# Patient Record
Sex: Male | Born: 1945
Health system: Southern US, Community
[De-identification: ages and names within clinical notes are randomized; demographics above are authoritative.]

## PROBLEM LIST (undated history)

## (undated) DIAGNOSIS — E785 Hyperlipidemia, unspecified: Secondary | ICD-10-CM

## (undated) HISTORY — PX: NO PAST SURGERIES: SHX2092

## (undated) HISTORY — DX: Hyperlipidemia, unspecified: E78.5

---

## 2019-02-22 ENCOUNTER — Other Ambulatory Visit: Payer: Self-pay

## 2019-02-22 ENCOUNTER — Encounter (HOSPITAL_COMMUNITY): Payer: Self-pay

## 2019-02-22 ENCOUNTER — Emergency Department (HOSPITAL_COMMUNITY)
Admission: EM | Admit: 2019-02-22 | Discharge: 2019-02-23 | Disposition: A | Discharge status: Home / Self Care | Payer: Medicare Other | Attending: Emergency Medicine | Admitting: Emergency Medicine

## 2019-02-22 DIAGNOSIS — Z5321 Procedure and treatment not carried out due to patient leaving prior to being seen by health care provider: Secondary | ICD-10-CM | POA: Diagnosis not present

## 2019-02-22 DIAGNOSIS — S8992XA Unspecified injury of left lower leg, initial encounter: Secondary | ICD-10-CM | POA: Insufficient documentation

## 2019-02-22 DIAGNOSIS — Y999 Unspecified external cause status: Secondary | ICD-10-CM | POA: Insufficient documentation

## 2019-02-22 DIAGNOSIS — Y929 Unspecified place or not applicable: Secondary | ICD-10-CM | POA: Diagnosis not present

## 2019-02-22 DIAGNOSIS — W25XXXA Contact with sharp glass, initial encounter: Secondary | ICD-10-CM | POA: Diagnosis not present

## 2019-02-22 DIAGNOSIS — Y939 Activity, unspecified: Secondary | ICD-10-CM | POA: Insufficient documentation

## 2019-02-22 NOTE — ED Triage Notes (Signed)
Pt states that a glass bottle but his L lower leg, bleeding controlled

## 2019-02-23 ENCOUNTER — Encounter (HOSPITAL_COMMUNITY): Payer: Self-pay

## 2019-02-23 ENCOUNTER — Other Ambulatory Visit: Payer: Self-pay

## 2019-02-23 ENCOUNTER — Ambulatory Visit (HOSPITAL_COMMUNITY)
Admission: EM | Admit: 2019-02-23 | Discharge: 2019-02-23 | Disposition: A | Discharge status: Home / Self Care | Payer: Medicare Other | Attending: Family Medicine | Admitting: Family Medicine

## 2019-02-23 DIAGNOSIS — S91312A Laceration without foreign body, left foot, initial encounter: Secondary | ICD-10-CM

## 2019-02-23 DIAGNOSIS — W25XXXA Contact with sharp glass, initial encounter: Secondary | ICD-10-CM | POA: Diagnosis not present

## 2019-02-23 DIAGNOSIS — R030 Elevated blood-pressure reading, without diagnosis of hypertension: Secondary | ICD-10-CM

## 2019-02-23 DIAGNOSIS — Z23 Encounter for immunization: Secondary | ICD-10-CM | POA: Diagnosis not present

## 2019-02-23 MED ORDER — TETANUS-DIPHTH-ACELL PERTUSSIS 5-2.5-18.5 LF-MCG/0.5 IM SUSP
0.5000 mL | Freq: Once | INTRAMUSCULAR | Status: AC
  Administered 2019-02-23: 0.5 mL via INTRAMUSCULAR

## 2019-02-23 MED ORDER — TETANUS-DIPHTH-ACELL PERTUSSIS 5-2.5-18.5 LF-MCG/0.5 IM SUSP
INTRAMUSCULAR | Status: AC
  Filled 2019-02-23: qty 0.5

## 2019-02-23 NOTE — ED Notes (Signed)
Pt did not want to wait and left. 

## 2019-02-23 NOTE — Discharge Instructions (Addendum)
Keep the wound dry for the next 24 hours and then place a bandage only when you are going to be outside or in a dirty area.  Return in 1 week for removal of sutures.

## 2019-02-23 NOTE — ED Provider Notes (Addendum)
[facility]    CSN: [id number] Arrival date & time: 02/23/19  0847      History   Chief Complaint Chief Complaint  Patient presents with  . Laceration    HPI Elliotte Vance Wiant is a 73 y.o. male.   73 year old gentleman dropped some glass on his left foot yesterday with a laceration over the dorsal aspect.  He has had continued bleeding since.  Unsure of last tetanus shot.  Patient has no history of elevated blood pressure.  He is somewhat anxious about the continued bleeding overnight.     History reviewed. No pertinent past medical history.  There are no problems to display for this patient.   History reviewed. No pertinent surgical history.     Home Medications    Prior to Admission medications   Not on File    Family History History reviewed. No pertinent family history.  Social History Social History   Tobacco Use  . Smoking status: Never Smoker  . Smokeless tobacco: Never Used  Substance Use Topics  . Alcohol use: Yes  . Drug use: Never     Allergies   Patient has no known allergies.   Review of Systems Review of Systems   Physical Exam Triage Vital Signs ED Triage Vitals  Enc Vitals Group     BP 02/23/19 0903 (!) 191/94     Pulse Rate 02/23/19 0903 64     Resp 02/23/19 0903 16     Temp 02/23/19 0903 97.9 F (36.6 C)     Temp Source 02/23/19 0903 Oral     SpO2 02/23/19 0903 98 %     Weight --      Height --      Head Circumference --      Peak Flow --      Pain Score 02/23/19 0901 0     Pain Loc --      Pain Edu? --      Excl. in [place]? --    No data found.  Updated Vital Signs BP (!) 191/94 (BP Location: Right Arm)   Pulse 64   Temp 97.9 F (36.6 C) (Oral)   Resp 16   SpO2 98%    Physical Exam Vitals and nursing note reviewed.  Constitutional:      Appearance: Normal appearance.  Pulmonary:     Effort: Pulmonary effort is normal.  Musculoskeletal:        General: Normal range of motion.   Cervical back: Normal range of motion and neck supple.  Skin:    General: Skin is warm and dry.     Findings: Lesion present.  Neurological:     General: No focal deficit present.     Mental Status: He is alert.  Psychiatric:        Mood and Affect: Mood normal.      UC Treatments / Results  Labs (all labs ordered are listed, but only abnormal results are displayed) Labs Reviewed - No data to display  EKG   Radiology No results found.  Procedures Laceration Repair  Date/Time: 02/23/2019 10:01 AM Performed by: Robyn Haber, MD Authorized by: Robyn Haber, MD   Consent:    Consent obtained:  Verbal   Consent given by:  Patient   Risks discussed:  Pain   Alternatives discussed:  No treatment Anesthesia (see MAR for exact dosages):    Anesthesia method:  Local infiltration   Local anesthetic:  Lidocaine 2% WITH epi Laceration details:  Location:  Foot   Foot location:  Top of L foot   Length (cm):  2.7   Depth (mm):  5 Repair type:    Repair type:  Simple Pre-procedure details:    Preparation:  Patient was prepped and draped in usual sterile fashion Exploration:    Contaminated: no   Treatment:    Area cleansed with:  Betadine and soap and water   Amount of cleaning:  Standard   Irrigation solution:  Sterile saline   Irrigation volume:  Soap and water   Visualized foreign bodies/material removed: no   Skin repair:    Repair method:  Sutures   Suture size:  4-0   Suture technique:  Horizontal mattress   Number of sutures:  4 Approximation:    Approximation:  Close Post-procedure details:    Dressing:  Bulky dressing   Patient tolerance of procedure:  Tolerated well, no immediate complications   (including critical care time)  Medications Ordered in UC Medications  Tdap (BOOSTRIX) injection 0.5 mL (has no administration in time range)    Initial Impression / Assessment and Plan / UC Course  I have reviewed the triage vital signs and the  nursing notes.  Pertinent labs & imaging results that were available during my care of the patient were reviewed by me and considered in my medical decision making (see chart for details).    Final Clinical Impressions(s) / UC Diagnoses   Final diagnoses:  Elevated BP without diagnosis of hypertension  Laceration of left foot, initial encounter     Discharge Instructions     Keep the wound dry for the next 24 hours and then place a bandage only when you are going to be outside or in a dirty area.  Return in 1 week for removal of sutures.    ED Prescriptions    None     I have reviewed the PDMP during this encounter.   Robyn Haber, MD 02/23/19 1002    Robyn Haber, MD 02/23/19 1003

## 2019-02-23 NOTE — ED Notes (Signed)
Vital signs reported to Provider K. Harris.

## 2019-02-23 NOTE — ED Triage Notes (Signed)
Pt states yesterday he cut his left ankle with a glass bottle.

## 2019-03-01 ENCOUNTER — Ambulatory Visit (HOSPITAL_COMMUNITY)
Admission: EM | Admit: 2019-03-01 | Discharge: 2019-03-01 | Disposition: A | Discharge status: Home / Self Care | Payer: Medicare Other

## 2019-03-01 ENCOUNTER — Other Ambulatory Visit: Payer: Self-pay

## 2019-03-01 DIAGNOSIS — S91312D Laceration without foreign body, left foot, subsequent encounter: Secondary | ICD-10-CM

## 2019-03-01 DIAGNOSIS — Z4802 Encounter for removal of sutures: Secondary | ICD-10-CM

## 2019-03-01 NOTE — ED Triage Notes (Signed)
Pt presents to have 4 sutures removed from left ankle.

## 2019-10-05 DIAGNOSIS — Z20822 Contact with and (suspected) exposure to covid-19: Secondary | ICD-10-CM | POA: Diagnosis not present

## 2019-12-04 ENCOUNTER — Ambulatory Visit: Payer: Medicare Other | Admitting: Family Medicine

## 2019-12-20 ENCOUNTER — Ambulatory Visit (INDEPENDENT_AMBULATORY_CARE_PROVIDER_SITE_OTHER): Payer: Medicare HMO | Admitting: Emergency Medicine

## 2019-12-20 ENCOUNTER — Encounter: Payer: Self-pay | Admitting: Emergency Medicine

## 2019-12-20 ENCOUNTER — Other Ambulatory Visit: Payer: Self-pay

## 2019-12-20 VITALS — BP 159/77 | HR 47 | Temp 98.4°F | Resp 16 | Ht 66.0 in | Wt 147.0 lb

## 2019-12-20 DIAGNOSIS — R4189 Other symptoms and signs involving cognitive functions and awareness: Secondary | ICD-10-CM | POA: Diagnosis not present

## 2019-12-20 DIAGNOSIS — R413 Other amnesia: Secondary | ICD-10-CM | POA: Diagnosis not present

## 2019-12-20 DIAGNOSIS — Z23 Encounter for immunization: Secondary | ICD-10-CM

## 2019-12-20 DIAGNOSIS — Z1211 Encounter for screening for malignant neoplasm of colon: Secondary | ICD-10-CM

## 2019-12-20 DIAGNOSIS — R6889 Other general symptoms and signs: Secondary | ICD-10-CM

## 2019-12-20 DIAGNOSIS — Z7689 Persons encountering health services in other specified circumstances: Secondary | ICD-10-CM

## 2019-12-20 LAB — LIPID PANEL

## 2019-12-20 NOTE — Patient Instructions (Addendum)
   If you have lab work done today you will be contacted with your lab results within the next 2 weeks.  If you have not heard from us then please contact us. The fastest way to get your results is to register for My Chart.   IF you received an x-ray today, you will receive an invoice from [facility]. Please contact [facility] at [phone] with questions or concerns regarding your invoice.   IF you received labwork today, you will receive an invoice from [facility]. Please contact [facility] at [phone] with questions or concerns regarding your invoice.   Our billing staff will not be able to assist you with questions regarding bills from these companies.  You will be contacted with the lab results as soon as they are available. The fastest way to get your results is to activate your My Chart account. Instructions are located on the last page of this paperwork. If you have not heard from us regarding the results in 2 weeks, please contact this office.     Mantenimiento de la salud despus de los 65 aos de edad Health Maintenance After Age 65 Despus de los 65 aos de edad, corre un riesgo mayor de padecer ciertas enfermedades e infecciones a largo plazo, como tambin de sufrir lesiones por cadas. Las cadas son la causa principal de las fracturas de huesos y lesiones en la cabeza de personas mayores de 65 aos de edad. Recibir cuidados preventivos de forma regular puede ayudarlo a mantenerse saludable y en buen estado. Los cuidados preventivos incluyen realizarse anlisis de forma regular y realizar cambios en el estilo de vida segn las recomendaciones del mdico. Converse con el profesional que lo asiste sobre:  Las pruebas de deteccin y los anlisis que debe realizarse. Una prueba de deteccin es un estudio que se para detectar la presencia de una enfermedad cuando no tiene sntomas.  Un plan de dieta y ejercicios adecuado para usted. Qu debo saber sobre las  pruebas de deteccin y los anlisis para prevenir cadas? Realizarse pruebas de deteccin y anlisis es la mejor manera de detectar un problema de salud de forma temprana. El diagnstico y tratamiento tempranos le brindan la mejor oportunidad de controlar las afecciones mdicas que son comunes despus de los 65 aos de edad. Ciertas afecciones y elecciones de estilo de vida pueden hacer que sea ms propenso a sufrir una cada. El mdico puede recomendarle lo siguiente:  Controles regulares de la visin. Una visin deficiente y afecciones como las cataratas pueden hacer que sea ms propenso a sufrir una cada. Si usa lentes, asegrese de obtener una receta actualizada si su visin cambia.  Revisin de medicamentos. Revise regularmente con el mdico todos los medicamentos que toma, incluidos los medicamentos de venta libre. Consulte al mdico sobre los efectos secundarios que pueden hacer que sea ms propenso a sufrir una cada. Informe al mdico si alguno de los medicamentos que toma lo hace sentir mareado o somnoliento.  Pruebas de deteccin para la osteoporosis. La osteoporosis es una afeccin que hace que los huesos se vuelvan ms frgiles. En consecuencia, los huesos pueden debilitarse y quebrarse ms fcilmente.  Pruebas de deteccin para la presin arterial. Los cambios en la presin arterial y los medicamentos para controlar la presin arterial pueden hacerlo sentir mareado.  Controles de fuerza y equilibrio. El mdico puede recomendar ciertos estudios para controlar su fuerza y equilibrio al estar de pie, al caminar o al cambiar de posicin.  Examen de los pies.   El dolor y Chiropractor en los pies, como tambin no utilizar el calzado [place], pueden hacer que sea ms propenso a sufrir Engineer, manufacturing.  Prueba de deteccin de la depresin. Es ms probable que sufra una cada si tiene miedo a caerse, se siente mal emocionalmente o se siente incapaz de Stage manager.  Prueba de deteccin de consumo de alcohol. Beber demasiado alcohol puede afectar su equilibrio y puede hacer que sea ms propenso a sufrir Engineer, manufacturing. Qu medidas puedo tomar para reducir mi riesgo de sufrir una cada? Instrucciones generales  Hable con el mdico sobre sus riesgos de sufrir una cada. Infrmele a su mdico si: ? Se cae. Asegrese de informarle a su mdico acerca de todas las cadas, incluso aquellas que parecen ser [employer] ? Se siente mareado, somnoliento o que pierde el equilibrio.  Tome los medicamentos de venta libre y los recetados solamente como se lo haya indicado el mdico. Estos incluyen todos los suplementos.  Siga una dieta sana y [place] un peso saludable. Una dieta saludable incluye productos lcteos descremados, carnes bajas en contenido de grasa ([place], [place] de granos enteros, frijoles y [place] frutas y verduras. La seguridad en el hogar  Retire los objetos que puedan causar tropiezos tales como alfombras, cables u obstculos.  Instale equipos de seguridad, como barras para sostn en los baos y barandas de seguridad en las escaleras.  [place] habitaciones y los pasillos bien iluminados. Actividad   Siga un programa de ejercicio regular para mantenerse en forma. Esto lo ayudar a Contractor equilibrio. Consulte al mdico qu tipos de ejercicios son adecuados para usted.  Si necesita un bastn o un andador, selo segn las recomendaciones del mdico.  Utilice calzado con buen apoyo y suela antideslizante. Estilo de vida  No beba alcohol si el mdico le indica que no beba.  Si bebe alcohol, limite la cantidad que consume: ? De 0 a 1 medida por da para las mujeres. ? De 0 a 2 medidas por da para los hombres.  Est atento a la cantidad de alcohol que contiene su bebida. En los [place] una medida equivale a una botella tpica de cerveza (12 onzas), media copa de vino (5 onzas) o una medida de bebida blanca (1 onza).  No consuma  ningn producto que contenga nicotina o tabaco, como cigarrillos y Psychologist, sport and exercise. Si necesita ayuda para dejar de fumar, consulte al mdico. Resumen  Tener un estilo de vida saludable y recibir cuidados preventivos pueden ayudar a Theatre stage manager salud y el bienestar despus de los 39 aos de [place].  Realizarse pruebas de deteccin y [employer] es la mejor manera de Hydrographic surveyor un problema de salud de forma temprana y Lourena Simmonds a Product/process development scientist una cada. El diagnstico y tratamiento tempranos le brindan la mejor oportunidad de Chief Technology Officer las afecciones mdicas ms comunes en las personas mayores de 86 aos de edad.  Las cadas son la causa principal de las fracturas de huesos y lesiones en la cabeza de personas mayores de 36 aos de edad. Tome precauciones para evitar una cada en su casa.  Trabaje con el mdico para saber qu cambios que puede hacer para mejorar su salud y [place], y [place]. Esta informacin no tiene Marine scientist el consejo del mdico. Asegrese de hacerle al mdico cualquier pregunta que tenga. Document Revised: 04/01/2017 Document Reviewed: 04/01/2017 Elsevier Patient Education  2020 [employer].

## 2019-12-20 NOTE — Progress Notes (Signed)
Samual Beals Haisley 74 y.o.   Chief Complaint  Patient presents with  . Establish Care    HISTORY OF PRESENT ILLNESS: This is a 74 y.o. male first visit to this office, here to establish care with me. Accompanied by wife who states patient has been forgetful, having memory problems, angry at times for the past several months. Patient has no history of chronic medical problems.  On no chronic medications. Healthy lifestyle.  Non-smoker.  HPI   Prior to Admission medications   Medication Sig Start Date End Date Taking? Authorizing Provider  Ascorbic Acid (VITAMIN C) 100 MG tablet Take 500 mg by mouth daily.   Yes [provider]  Calcium Carb-Cholecalciferol (CALCIUM 600 + D PO) Take by mouth.   Yes [provider]  Multiple Vitamin (MULTIVITAMIN) tablet Take 1 tablet by mouth daily.   Yes [provider]  OVER THE COUNTER MEDICATION    Yes [provider]  [place]    Yes [provider]    No Known Allergies  There are no problems to display for this patient.   History reviewed. No pertinent past medical history.  History reviewed. No pertinent surgical history.  Social History   Socioeconomic History  . Marital status: Married    Spouse name: Not on file  . Number of children: Not on file  . Years of education: Not on file  . Highest education level: Not on file  Occupational History  . Not on file  Tobacco Use  . Smoking status: Never Smoker  . Smokeless tobacco: Never Used  Substance and Sexual Activity  . Alcohol use: Yes  . Drug use: Never  . Sexual activity: Not on file  Other Topics Concern  . Not on file  Social History Narrative  . Not on file   Social Determinants of Health   Financial Resource Strain:   . Difficulty of Paying Living Expenses: Not on file  Food Insecurity:   . Worried About Charity fundraiser in the Last Year: Not on file  . Ran Out of Food in the Last Year: Not  on file  Transportation Needs:   . Lack of Transportation (Medical): Not on file  . Lack of Transportation (Non-Medical): Not on file  Physical Activity:   . Days of Exercise per Week: Not on file  . Minutes of Exercise per Session: Not on file  Stress:   . Feeling of Stress : Not on file  Social Connections:   . Frequency of Communication with Friends and Family: Not on file  . Frequency of Social Gatherings with Friends and Family: Not on file  . Attends Religious Services: Not on file  . Active Member of Clubs or Organizations: Not on file  . Attends Archivist Meetings: Not on file  . Marital Status: Not on file  Intimate Partner Violence:   . Fear of Current or Ex-Partner: Not on file  . Emotionally Abused: Not on file  . Physically Abused: Not on file  . Sexually Abused: Not on file    History reviewed. No pertinent family history.   Review of Systems  Constitutional: Negative.  Negative for chills and fever.  HENT: Negative.  Negative for congestion and sore throat.   Eyes: Negative.   Respiratory: Negative.  Negative for cough and shortness of breath.   Cardiovascular: Negative.  Negative for chest pain and palpitations.  Gastrointestinal: Negative.  Negative for abdominal pain, blood in stool, diarrhea,  melena, nausea and vomiting.  Genitourinary: Negative.  Negative for dysuria and hematuria.  Musculoskeletal: Negative.  Negative for back pain, myalgias and neck pain.  Skin: Negative.  Negative for rash.  Neurological: Negative.  Negative for dizziness and headaches.  All other systems reviewed and are negative.  Today's Vitals   12/20/19 1106  BP: (!) 159/77  Pulse: (!) 47  Resp: 16  Temp: 98.4 F (36.9 C)  TempSrc: Temporal  SpO2: 97%  Weight: 147 lb (66.7 kg)  Height: 5\' 6"  (1.676 m)   Body mass index is 23.73 kg/m.   Physical Exam Vitals reviewed.  Constitutional:      Appearance: Normal appearance.  HENT:     Head: Normocephalic.      Mouth/Throat:     Mouth: Mucous membranes are moist.     Pharynx: Oropharynx is clear.  Eyes:     Extraocular Movements: Extraocular movements intact.     Conjunctiva/sclera: Conjunctivae normal.     Pupils: Pupils are equal, round, and reactive to light.  Neck:     Vascular: No carotid bruit.  Cardiovascular:     Rate and Rhythm: Normal rate and regular rhythm.     Heart sounds: Murmur heard.  Systolic murmur is present with a grade of 2/6.      Comments: Aortic area systolic murmur Pulmonary:     Effort: Pulmonary effort is normal.     Breath sounds: Normal breath sounds.  Abdominal:     General: Bowel sounds are normal. There is no distension.     Palpations: Abdomen is soft. There is no mass.     Tenderness: There is no abdominal tenderness.  Musculoskeletal:        General: Normal range of motion.     Cervical back: Normal range of motion and neck supple.     Right lower leg: No edema.     Left lower leg: No edema.  Lymphadenopathy:     Cervical: No cervical adenopathy.  Skin:    General: Skin is warm and dry.     Capillary Refill: Capillary refill takes less than 2 seconds.  Neurological:     General: No focal deficit present.     Mental Status: He is alert and oriented to person, place, and time.  Psychiatric:        Mood and Affect: Mood normal.        Behavior: Behavior normal.      ASSESSMENT & PLAN: Duayne was seen today for establish care.  Diagnoses and all orders for this visit:  Encounter to establish care  Memory deficit -     Ambulatory referral to Neurology  Forgetfulness -     CBC with Differential/Platelet -     Comprehensive metabolic panel -     Vitamin B12 -     VITAMIN D 25 Hydroxy (Vit-D Deficiency, Fractures) -     Hemoglobin A1c -     TSH -     Lipid panel -     Ambulatory referral to Neurology  Need for prophylactic vaccination and inoculation against influenza -     Flu Vaccine QUAD High Dose(Fluad)  Cognitive decline -      CBC with Differential/Platelet -     Comprehensive metabolic panel -     Vitamin B12 -     VITAMIN D 25 Hydroxy (Vit-D Deficiency, Fractures) -     Hemoglobin A1c -     TSH -     Lipid panel -  Ambulatory referral to Neurology  Screening for colon cancer -     Ambulatory referral to Gastroenterology    Patient Instructions       If you have lab work done today you will be contacted with your lab results within the next 2 weeks.  If you have not heard from [place] then please contact us. The fastest way to get your results is to register for My Chart.   IF you received an x-ray today, you will receive an invoice from [facility]. Please contact [facility] at [phone] with questions or concerns regarding your invoice.   IF you received labwork today, you will receive an invoice from [facility]. Please contact [facility] at [phone] with questions or concerns regarding your invoice.   Our billing staff will not be able to assist you with questions regarding bills from these companies.  You will be contacted with the lab results as soon as they are available. The fastest way to get your results is to activate your My Chart account. Instructions are located on the last page of this paperwork. If you have not heard from [place] regarding the results in 2 weeks, please contact this office.     Mantenimiento de la salud despus de los 42 aos de edad Health Maintenance After Age 72 Despus de los 65 aos de edad, corre un riesgo mayor de Tourist information centre manager enfermedades e infecciones a Barrister's clerk, como tambin de sufrir lesiones por cadas. Las cadas son la causa principal de las fracturas de huesos y lesiones en la cabeza de personas mayores de 13 aos de edad. Recibir cuidados preventivos de forma regular puede ayudarlo a mantenerse saludable y en buen [place]. Los cuidados preventivos incluyen realizarse anlisis de forma regular y Actor en el estilo de vida  segn las recomendaciones del mdico. Converse con el profesional que lo asiste sobre:  Las pruebas de deteccin y los anlisis que debe Dispensing optician. Una prueba de deteccin es un estudio que se para Hydrographic surveyor la presencia de una enfermedad cuando no tiene sntomas.  Un plan de dieta y ejercicios adecuado para usted. Qu debo saber sobre las pruebas de deteccin y los anlisis para prevenir cadas? Realizarse pruebas de deteccin y [employer] es la mejor manera de Hydrographic surveyor un problema de salud de forma temprana. El diagnstico y tratamiento tempranos le brindan la mejor oportunidad de Chief Technology Officer las afecciones mdicas que son comunes despus de los 71 aos de edad. Ciertas afecciones y elecciones de estilo de vida pueden hacer que sea ms propenso a sufrir Engineer, manufacturing. El mdico puede recomendarle lo siguiente:  Controles regulares de la visin. Una visin deficiente y afecciones como las cataratas pueden hacer que sea ms propenso a sufrir Engineer, manufacturing. Si [place] lentes, asegrese de obtener una receta actualizada si su visin cambia.  Revisin de medicamentos. Revise regularmente con el mdico todos los medicamentos que toma, incluidos los medicamentos de [place]. Consulte al [employer] efectos secundarios que pueden hacer que sea ms propenso a sufrir Engineer, manufacturing. Informe al mdico si alguno de los medicamentos que toma lo hace sentir mareado o somnoliento.  Pruebas de deteccin para la osteoporosis. La osteoporosis es una afeccin que hace que los huesos se vuelvan ms frgiles. En consecuencia, los huesos pueden debilitarse y quebrarse ms fcilmente.  Pruebas de deteccin para la presin arterial. Los cambios en la presin arterial y los medicamentos para Chief Technology Officer la presin arterial pueden hacerlo sentir mareado.  Controles de fuerza y equilibrio. El mdico  puede recomendar ciertos estudios para controlar su fuerza y equilibrio al estar de pie, al caminar o al cambiar de posicin.  Examen de  los pies. El dolor y Chiropractor en los pies, como tambin no utilizar el calzado [place], pueden hacer que sea ms propenso a sufrir Engineer, manufacturing.  Prueba de deteccin de la depresin. Es ms probable que sufra una cada si tiene miedo a caerse, se siente mal emocionalmente o se siente incapaz de Patent examiner.  Prueba de deteccin de consumo de alcohol. Beber demasiado alcohol puede afectar su equilibrio y puede hacer que sea ms propenso a sufrir Engineer, manufacturing. Qu medidas puedo tomar para reducir mi riesgo de sufrir una cada? Instrucciones generales  Hable con el mdico sobre sus riesgos de sufrir una cada. Infrmele a su mdico si: ? Se cae. Asegrese de informarle a su mdico acerca de todas las cadas, incluso aquellas que parecen ser [employer] ? Se siente mareado, somnoliento o que pierde el equilibrio.  Tome los medicamentos de venta libre y los recetados solamente como se lo haya indicado el mdico. Estos incluyen todos los suplementos.  Siga una dieta sana y [place] un peso saludable. Una dieta saludable incluye productos lcteos descremados, carnes bajas en contenido de grasa ([place], [place] de granos enteros, frijoles y [place] frutas y verduras. La seguridad en el hogar  Retire los objetos que puedan causar tropiezos tales como alfombras, cables u obstculos.  Instale equipos de seguridad, como barras para sostn en los baos y barandas de seguridad en las escaleras.  [place] habitaciones y los pasillos bien iluminados. Actividad   Siga un programa de ejercicio regular para mantenerse en forma. Esto lo ayudar a Contractor equilibrio. Consulte al mdico qu tipos de ejercicios son adecuados para usted.  Si necesita un bastn o un andador, selo segn las recomendaciones del mdico.  Utilice calzado con buen apoyo y suela antideslizante. Estilo de vida  No beba alcohol si el mdico le indica que no beba.  Si bebe alcohol, limite la cantidad  que consume: ? De 0 a 1 medida por da para las mujeres. ? De 0 a 2 medidas por da para los hombres.  Est atento a la cantidad de alcohol que contiene su bebida. En los [place] una medida equivale a una botella tpica de cerveza (12 onzas), media copa de vino (5 onzas) o una medida de bebida blanca (1 onza).  No consuma ningn producto que contenga nicotina o tabaco, como cigarrillos y Psychologist, sport and exercise. Si necesita ayuda para dejar de fumar, consulte al mdico. Resumen  Tener un estilo de vida saludable y recibir cuidados preventivos pueden ayudar a Theatre stage manager salud y el bienestar despus de los 62 aos de [place].  Realizarse pruebas de deteccin y [employer] es la mejor manera de Hydrographic surveyor un problema de salud de forma temprana y Lourena Simmonds a Product/process development scientist una cada. El diagnstico y tratamiento tempranos le brindan la mejor oportunidad de Chief Technology Officer las afecciones mdicas ms comunes en las personas mayores de 75 aos de edad.  Las cadas son la causa principal de las fracturas de huesos y lesiones en la cabeza de personas mayores de 45 aos de edad. Tome precauciones para evitar una cada en su casa.  Trabaje con el mdico para saber qu cambios que puede hacer para mejorar su salud y [place], y [place]. Esta informacin no tiene Marine scientist el consejo del mdico. Asegrese de hacerle al mdico cualquier pregunta que tenga.  Document Revised: 04/01/2017 Document Reviewed: 04/01/2017 Elsevier Patient Education  2020 Elsevier Inc.      Agustina Caroli, MD [facility]

## 2019-12-21 ENCOUNTER — Other Ambulatory Visit: Payer: Self-pay

## 2019-12-21 ENCOUNTER — Other Ambulatory Visit: Payer: Self-pay | Admitting: Emergency Medicine

## 2019-12-21 DIAGNOSIS — E559 Vitamin D deficiency, unspecified: Secondary | ICD-10-CM | POA: Insufficient documentation

## 2019-12-21 DIAGNOSIS — E785 Hyperlipidemia, unspecified: Secondary | ICD-10-CM

## 2019-12-21 LAB — CBC WITH DIFFERENTIAL/PLATELET
Basophils Absolute: 0 10*3/uL (ref 0.0–0.2)
Basos: 1 %
EOS (ABSOLUTE): 0.2 10*3/uL (ref 0.0–0.4)
Eos: 5 %
Hematocrit: 46.1 % (ref 37.5–51.0)
Hemoglobin: 15.7 g/dL (ref 13.0–17.7)
Immature Grans (Abs): 0 10*3/uL (ref 0.0–0.1)
Immature Granulocytes: 0 %
Lymphocytes Absolute: 1.2 10*3/uL (ref 0.7–3.1)
Lymphs: 42 %
MCH: 32.2 pg (ref 26.6–33.0)
MCHC: 34.1 g/dL (ref 31.5–35.7)
MCV: 95 fL (ref 79–97)
Monocytes Absolute: 0.2 10*3/uL (ref 0.1–0.9)
Monocytes: 8 %
Neutrophils Absolute: 1.3 10*3/uL — ABNORMAL LOW (ref 1.4–7.0)
Neutrophils: 44 %
Platelets: 240 10*3/uL (ref 150–450)
RBC: 4.88 x10E6/uL (ref 4.14–5.80)
RDW: 13.1 % (ref 11.6–15.4)
WBC: 2.9 10*3/uL — ABNORMAL LOW (ref 3.4–10.8)

## 2019-12-21 LAB — HEMOGLOBIN A1C
Est. average glucose Bld gHb Est-mCnc: 111 mg/dL
Hgb A1c MFr Bld: 5.5 % (ref 4.8–5.6)

## 2019-12-21 LAB — COMPREHENSIVE METABOLIC PANEL
ALT: 13 IU/L (ref 0–44)
AST: 25 IU/L (ref 0–40)
Albumin/Globulin Ratio: 1.4 (ref 1.2–2.2)
Albumin: 4.5 g/dL (ref 3.7–4.7)
Alkaline Phosphatase: 71 IU/L (ref 44–121)
BUN/Creatinine Ratio: 16 (ref 10–24)
BUN: 15 mg/dL (ref 8–27)
Bilirubin Total: 0.5 mg/dL (ref 0.0–1.2)
CO2: 24 mmol/L (ref 20–29)
Calcium: 9.4 mg/dL (ref 8.6–10.2)
Chloride: 101 mmol/L (ref 96–106)
Creatinine, Ser: 0.93 mg/dL (ref 0.76–1.27)
GFR calc Af Amer: 94 mL/min/{1.73_m2} (ref >59)
GFR calc non Af Amer: 81 mL/min/{1.73_m2} (ref >59)
Globulin, Total: 3.2 g/dL (ref 1.5–4.5)
Glucose: 90 mg/dL (ref 65–99)
Potassium: 4.3 mmol/L (ref 3.5–5.2)
Sodium: 140 mmol/L (ref 134–144)
Total Protein: 7.7 g/dL (ref 6.0–8.5)

## 2019-12-21 LAB — LIPID PANEL
Chol/HDL Ratio: 5.1 ratio — ABNORMAL HIGH (ref 0.0–5.0)
Cholesterol, Total: 257 mg/dL — ABNORMAL HIGH (ref 100–199)
HDL: 50 mg/dL (ref >39)
LDL Chol Calc (NIH): 171 mg/dL — ABNORMAL HIGH (ref 0–99)
Triglycerides: 192 mg/dL — ABNORMAL HIGH (ref 0–149)
VLDL Cholesterol Cal: 36 mg/dL (ref 5–40)

## 2019-12-21 LAB — VITAMIN B12: Vitamin B-12: 1337 pg/mL — ABNORMAL HIGH (ref 232–1245)

## 2019-12-21 LAB — VITAMIN D 25 HYDROXY (VIT D DEFICIENCY, FRACTURES): Vit D, 25-Hydroxy: 24.1 ng/mL — ABNORMAL LOW (ref 30.0–100.0)

## 2019-12-21 LAB — TSH: TSH: 1.85 u[IU]/mL (ref 0.450–4.500)

## 2019-12-21 MED ORDER — VITAMIN D (ERGOCALCIFEROL) 1.25 MG (50000 UNIT) PO CAPS: 50000.0000 [IU] | ORAL_CAPSULE | ORAL | 0 refills | Status: DC

## 2019-12-21 MED ORDER — ROSUVASTATIN CALCIUM 10 MG PO TABS: 10.0000 mg | ORAL_TABLET | Freq: Every day | ORAL | 3 refills | Status: DC

## 2019-12-22 ENCOUNTER — Encounter: Payer: Self-pay | Admitting: Internal Medicine

## 2020-01-22 ENCOUNTER — Other Ambulatory Visit: Payer: Self-pay | Admitting: Emergency Medicine

## 2020-01-22 DIAGNOSIS — E559 Vitamin D deficiency, unspecified: Secondary | ICD-10-CM

## 2020-01-22 NOTE — Telephone Encounter (Signed)
Requested medication (s) are due for refill today: yes  Requested medication (s) are on the active medication list: yes  Last refill:  12/21/19 #7 0 refills  Future visit scheduled: yes   Notes to clinic:  not delegated per protocol     Requested Prescriptions  Pending Prescriptions Disp Refills   Vitamin D, Ergocalciferol, (DRISDOL) 1.25 MG (50000 UNIT) CAPS capsule [Pharmacy Med Name: VITAMIN D 1.25 MG (50000 UT) Capsule] 7 capsule 0    Sig: TAKE 1 CAPSULE EVERY 7 DAYS      Endocrinology:  Vitamins - Vitamin D Supplementation Failed - 01/22/2020  2:46 PM      Failed - 50,000 IU strengths are not delegated      Failed - Phosphate in normal range and within 360 days    No results found for: PHOS        Failed - Vitamin D in normal range and within 360 days    Vit D, 25-Hydroxy  Date Value Ref Range Status  12/20/2019 24.1 (L) 30.0 - 100.0 ng/mL Final    Comment:    Vitamin D deficiency has been defined by the Institute of Medicine and an Endocrine Society practice guideline as a level of serum 25-OH vitamin D less than 20 ng/mL (1,2). The Endocrine Society went on to further define vitamin D insufficiency as a level between 21 and 29 ng/mL (2). 1. IOM (Institute of Medicine). 2010. Dietary reference    intakes for calcium and D. [place]: The    [employer]. 2. Holick MF, Binkley [place], Bischoff-Ferrari HA, et al.    Evaluation, treatment, and prevention of vitamin D    deficiency: an Endocrine Society clinical practice    guideline. JCEM. 2011 Jul; 96(7):1911-30.           Passed - Ca in normal range and within 360 days    Calcium  Date Value Ref Range Status  12/20/2019 9.4 8.6 - 10.2 mg/dL Final          Passed - Valid encounter within last 12 months    Recent Outpatient Visits           1 month ago Encounter to establish care   Primary Care at [facility], [place], [place]

## 2020-02-14 ENCOUNTER — Other Ambulatory Visit: Payer: Self-pay

## 2020-02-14 ENCOUNTER — Ambulatory Visit (AMBULATORY_SURGERY_CENTER): Payer: Self-pay | Admitting: *Deleted

## 2020-02-14 VITALS — Ht 66.0 in | Wt 151.0 lb

## 2020-02-14 DIAGNOSIS — Z1211 Encounter for screening for malignant neoplasm of colon: Secondary | ICD-10-CM

## 2020-02-14 MED ORDER — NA SULFATE-K SULFATE-MG SULF 17.5-3.13-1.6 GM/177ML PO SOLN: ORAL | 0 refills | Status: DC

## 2020-02-14 NOTE — Progress Notes (Signed)
Patient and wife is here in-person for PV. Pt's wife helps to translate for the patient.  Patient denies any allergies to eggs or soy. Patient denies any problems with anesthesia/sedation. Patient denies any oxygen use at home. Patient denies taking any diet/weight loss medications or blood thinners. Patient is not being treated for MRSA or C-diff. Patient is aware of our care-partner policy and [id number] safety protocol. Colonoscopy pamphlet given to the patient.    COVID-19 vaccines completed on 06/03/2019, per patient.

## 2020-02-16 ENCOUNTER — Encounter: Payer: Self-pay | Admitting: Internal Medicine

## 2020-02-19 ENCOUNTER — Encounter: Payer: Self-pay | Admitting: Neurology

## 2020-02-19 ENCOUNTER — Ambulatory Visit: Payer: Medicare HMO | Admitting: Neurology

## 2020-02-19 ENCOUNTER — Other Ambulatory Visit: Payer: Self-pay

## 2020-02-19 DIAGNOSIS — R413 Other amnesia: Secondary | ICD-10-CM | POA: Diagnosis not present

## 2020-02-19 HISTORY — DX: Other amnesia: R41.3

## 2020-02-19 MED ORDER — DONEPEZIL HCL 5 MG PO TABS: 5.0000 mg | ORAL_TABLET | Freq: Every day | ORAL | 0 refills | Status: DC

## 2020-02-19 NOTE — Progress Notes (Signed)
Reason for visit: Memory disturbance  Referring physician: Dr. Jolly Mango Eric Floyd is a 74 y.o. male  History of present illness:  Eric Floyd is a 56 year old right-handed Hispanic male with a 1 year history of changes in memory.  He comes in with his wife today who indicates that she has noted some significant changes in memory for least a year or year and a half prior to this visit.  The wife noted that he was having some increasing problems with driving, he was starting to get lost, she has not allowed him to drive over the last year.  She now has taken over the finances and is managing his medications over the last year, she also keeps up with the appointments.  The patient has short-term memory issues, he does have some problems with remembering names.  His older sister apparently also has memory problems.  The patient denies any headaches or dizziness, he denies numbness or weakness of the extremities or difficulty with balance or difficulty controlling the bowels or the bladder.  He is sleeping fairly well, but he has a tendency to nap during the day and stay awake and watch TV at night.  He is sent to this office for further evaluation.  He has had recent blood work done that shows a normal B12 level.  Past Medical History:  Diagnosis Date  . Hyperlipidemia   . Memory difficulty 02/19/2020    Past Surgical History:  Procedure Laterality Date  . NO PAST SURGERIES      Family History  Problem Relation Age of Onset  . Stroke Mother   . Heart disease Brother   . Heart attack Brother   . Ovarian cancer Niece   . Colon cancer Neg Hx   . Colon polyps Neg Hx   . Esophageal cancer Neg Hx   . Rectal cancer Neg Hx   . Stomach cancer Neg Hx     Social history:  reports that he has never smoked. He has never used smokeless tobacco. He reports current alcohol use of about 21.0 standard drinks of alcohol per week. He reports that he does not use drugs.  Medications:   Prior to Admission medications   Medication Sig Start Date End Date Taking? Authorizing Provider  Ascorbic Acid (VITAMIN C) 100 MG tablet Take 500 mg by mouth daily.    [provider]  Calcium Carb-Cholecalciferol (CALCIUM 600 + D PO) Take by mouth.    [provider]  cetirizine (ZYRTEC) 10 MG tablet Take 10 mg by mouth daily.    [provider]  Cyanocobalamin (VITAMIN B-12 PO) Take by mouth.    [provider]  Multiple Vitamin (MULTIVITAMIN) tablet Take 1 tablet by mouth daily.    [provider]  Na Sulfate-K Sulfate-Mg Sulf 17.5-3.13-1.6 GM/177ML SOLN Suprep (no substitutions)-TAKE AS DIRECTED. 02/14/20   Pyrtle, Lajuan Lines, MD  OVER THE COUNTER MEDICATION     [provider]  OVER THE COUNTER MEDICATION     [provider]  rosuvastatin (CRESTOR) 10 MG tablet Take 1 tablet (10 mg total) by mouth daily. 12/21/19   Horald Pollen, MD  Vitamin D, Ergocalciferol, (DRISDOL) 1.25 MG (50000 UNIT) CAPS capsule TAKE 1 CAPSULE EVERY 7 DAYS 01/22/20   Horald Pollen, MD     No Known Allergies  ROS:  Out of a complete 14 system review of symptoms, the patient complains only of the following symptoms, and all other reviewed systems are  negative.  Memory problems  Blood pressure (!) 199/78, pulse (!) 55, height 5\' 7"  (1.702 m), weight 153 lb 8 oz (69.6 kg).  Physical Exam  General: The patient is alert and cooperative at the time of the examination.  Eyes: Pupils are equal, round, and reactive to light. Discs are flat bilaterally.  Neck: The neck is supple, no carotid bruits are noted.  Respiratory: The respiratory examination is clear.  Cardiovascular: The cardiovascular examination reveals a regular rate and rhythm, no obvious murmurs or rubs are noted.  Skin: Extremities are without significant edema.  Neurologic Exam  Mental status: The patient is alert and oriented x 3 at the time of the examination. The  Mini-Mental status examination done today shows a total score of 18/30.  Cranial nerves: Facial symmetry is present. There is good sensation of the face to pinprick and soft touch bilaterally. The strength of the facial muscles and the muscles to head turning and shoulder shrug are normal bilaterally. Speech is well enunciated, no aphasia or dysarthria is noted. Extraocular movements are full. Visual fields are full. The tongue is midline, and the patient has symmetric elevation of the soft palate. No obvious hearing deficits are noted.  Motor: The motor testing reveals 5 over 5 strength of all 4 extremities. Good symmetric motor tone is noted throughout.  Sensory: Sensory testing is intact to pinprick, soft touch, vibration sensation, and position sense on all 4 extremities. No evidence of extinction is noted.  Coordination: Cerebellar testing reveals good finger-nose-finger and heel-to-shin bilaterally.  Gait and station: Gait is normal. Tandem gait is unsteady. Romberg is negative, but is somewhat unsteady. No drift is seen.  Reflexes: Deep tendon reflexes are symmetric and normal bilaterally. Toes are downgoing bilaterally.   Assessment/Plan:  1.  Memory disturbance  The patient has had some progression of memory over the last year or year and a half.  The patient will be started on Aricept at 5 mg at night, we will go up to 10 mg after 1 month if he is tolerating the drug.  He will be set up for MRI of the brain.  He will follow up here in 6 months.   Jill Alexanders MD 02/19/2020 3:09 PM  [facility] [address] [place], [place] [address]  Phone [phone] Fax [phone]

## 2020-02-19 NOTE — Patient Instructions (Signed)
We will start Aricept for the memory.   Begin Aricept (donepezil) at 5 mg at night for one month. If this medication is well-tolerated, please call our office and we will call in a prescription for the 10 mg tablets. Look out for side effects that may include nausea, diarrhea, weight loss, or stomach cramps. This medication will also cause a runny nose, therefore there is no need for allergy medications for this purpose.  

## 2020-02-20 ENCOUNTER — Telehealth: Payer: Self-pay | Admitting: Neurology

## 2020-02-20 NOTE — Telephone Encounter (Signed)
[employer] pending  

## 2020-02-20 NOTE — Telephone Encounter (Signed)
Mcarthur Rossetti Josem Kaufmann: [id number] (exp. 02/20/20 to 03/21/20) order sent to GI. They will reach out to the patient to schedule.

## 2020-02-21 ENCOUNTER — Other Ambulatory Visit: Payer: Self-pay | Admitting: Neurology

## 2020-02-21 DIAGNOSIS — Z77018 Contact with and (suspected) exposure to other hazardous metals: Secondary | ICD-10-CM

## 2020-02-23 ENCOUNTER — Other Ambulatory Visit: Payer: Self-pay | Admitting: Emergency Medicine

## 2020-02-23 DIAGNOSIS — E559 Vitamin D deficiency, unspecified: Secondary | ICD-10-CM

## 2020-02-25 NOTE — Telephone Encounter (Signed)
Requested medication (s) are due for refill today: no  Requested medication (s) are on the active medication list: yes  Last refill:  01/22/20  Future visit scheduled: no  Notes to clinic:  med not delegated to NT to RF   Requested Prescriptions  Pending Prescriptions Disp Refills   Vitamin D, Ergocalciferol, (DRISDOL) 1.25 MG (50000 UNIT) CAPS capsule [Pharmacy Med Name: VITAMIN D 1.25 MG (50000 UT) Capsule] 7 capsule 0    Sig: TAKE 1 CAPSULE EVERY 7 DAYS      Endocrinology:  Vitamins - Vitamin D Supplementation Failed - 02/23/2020  1:13 PM      Failed - 50,000 IU strengths are not delegated      Failed - Phosphate in normal range and within 360 days    No results found for: PHOS        Failed - Vitamin D in normal range and within 360 days    Vit D, 25-Hydroxy  Date Value Ref Range Status  12/20/2019 24.1 (L) 30.0 - 100.0 ng/mL Final    Comment:    Vitamin D deficiency has been defined by the Institute of Medicine and an Endocrine Society practice guideline as a level of serum 25-OH vitamin D less than 20 ng/mL (1,2). The Endocrine Society went on to further define vitamin D insufficiency as a level between 21 and 29 ng/mL (2). 1. IOM (Institute of Medicine). 2010. Dietary reference    intakes for calcium and D. [place]: The    [employer]. 2. Holick MF, Binkley [place], Bischoff-Ferrari HA, et al.    Evaluation, treatment, and prevention of vitamin D    deficiency: an Endocrine Society clinical practice    guideline. JCEM. 2011 Jul; 96(7):1911-30.           Passed - Ca in normal range and within 360 days    Calcium  Date Value Ref Range Status  12/20/2019 9.4 8.6 - 10.2 mg/dL Final          Passed - Valid encounter within last 12 months    Recent Outpatient Visits           2 months ago Encounter to establish care   Primary Care at [facility], [place], [place]

## 2020-02-27 ENCOUNTER — Encounter: Payer: Self-pay | Admitting: Internal Medicine

## 2020-03-07 ENCOUNTER — Ambulatory Visit: Payer: Medicare HMO | Admitting: Neurology

## 2020-03-13 ENCOUNTER — Ambulatory Visit
Admission: RE | Admit: 2020-03-13 | Discharge: 2020-03-13 | Disposition: A | Discharge status: Home / Self Care | Payer: PPO | Source: Ambulatory Visit | Attending: Neurology | Admitting: Neurology

## 2020-03-13 ENCOUNTER — Other Ambulatory Visit: Payer: Self-pay

## 2020-03-13 DIAGNOSIS — R413 Other amnesia: Secondary | ICD-10-CM

## 2020-03-13 DIAGNOSIS — Z01818 Encounter for other preprocedural examination: Secondary | ICD-10-CM | POA: Diagnosis not present

## 2020-03-13 DIAGNOSIS — Z77018 Contact with and (suspected) exposure to other hazardous metals: Secondary | ICD-10-CM

## 2020-03-15 ENCOUNTER — Other Ambulatory Visit: Payer: Self-pay | Admitting: Neurology

## 2020-03-15 ENCOUNTER — Telehealth: Payer: Self-pay | Admitting: Neurology

## 2020-03-15 NOTE — Telephone Encounter (Signed)
I called concerning the MRI of the brain.  It shows generalized atrophy consistent with an Alzheimer's process.  Mild white matter changes seen.  The patient has a moderate level of dementia.    MRI brain 03/14/20:  IMPRESSION: Abnormal MRI scan of the brain showing moderate degree of generalized cerebral atrophy which appears to be age disproportionate.  There are mild changes of chronic small vessel disease and moderate changes of chronic paranasal sinusitis and mild disc degenerative changes are noted in the upper cervical spine incidentally.

## 2020-03-15 NOTE — Telephone Encounter (Signed)
Wife is asking for a refill but at a 10mg  of the  donepezil (ARICEPT) tablet to [place]

## 2020-03-19 MED ORDER — DONEPEZIL HCL 10 MG PO TABS: 10.0000 mg | ORAL_TABLET | Freq: Every day | ORAL | 3 refills | Status: DC

## 2020-03-19 NOTE — Addendum Note (Signed)
Addended by: Noberto Retort C on: 03/19/2020 11:04 AM   Modules accepted: Orders

## 2020-03-21 NOTE — Addendum Note (Signed)
Addended by: Kathrynn Ducking on: 03/21/2020 10:42 AM   Modules accepted: Orders

## 2020-03-21 NOTE — Telephone Encounter (Signed)
Pt.'s son Ricardo's phone number is [phone].

## 2020-03-21 NOTE — Telephone Encounter (Signed)
Pt.'s son Allena Katz is on [place]. He is asking if Dr. Jannifer Franklin or RN can give a call back as he has more questions regarding dad's MRI results. Please advise.

## 2020-03-21 NOTE — Telephone Encounter (Signed)
I called and discussed the MRI findings with the son, discussed the diagnosis of Alzheimer's disease and treatments for this.

## 2020-04-08 ENCOUNTER — Encounter: Payer: Self-pay | Admitting: Internal Medicine

## 2020-04-10 ENCOUNTER — Encounter: Payer: Self-pay | Admitting: Internal Medicine

## 2020-04-10 ENCOUNTER — Other Ambulatory Visit: Payer: Self-pay

## 2020-04-10 ENCOUNTER — Ambulatory Visit (AMBULATORY_SURGERY_CENTER): Payer: PPO | Admitting: Internal Medicine

## 2020-04-10 VITALS — BP 171/78 | HR 39 | Temp 97.5°F | Resp 19 | Ht 66.0 in | Wt 151.0 lb

## 2020-04-10 DIAGNOSIS — Z1211 Encounter for screening for malignant neoplasm of colon: Secondary | ICD-10-CM | POA: Diagnosis not present

## 2020-04-10 DIAGNOSIS — D120 Benign neoplasm of cecum: Secondary | ICD-10-CM

## 2020-04-10 DIAGNOSIS — K635 Polyp of colon: Secondary | ICD-10-CM

## 2020-04-10 MED ORDER — SODIUM CHLORIDE 0.9 % IV SOLN: 500.0000 mL | Freq: Once | INTRAVENOUS | Status: DC

## 2020-04-10 NOTE — Progress Notes (Signed)
Called to room to assist during endoscopic procedure.  Patient ID and intended procedure confirmed with present staff. Received instructions for my participation in the procedure from the performing physician.  

## 2020-04-10 NOTE — Op Note (Signed)
[place] Patient Name: Eric Floyd Procedure Date: 04/10/2020 3:13 PM MRN: [id number] Endoscopist: Jerene Bears , MD Age: 75 Referring MD:  Date of Birth: 1945-04-24 Gender: Male Account #: [id number] Procedure:                Colonoscopy Indications:              Screening for colorectal malignant neoplasm, This                            is the patient's first colonoscopy Medicines:                Monitored Anesthesia Care Procedure:                Pre-Anesthesia Assessment:                           - Prior to the procedure, a History and Physical                            was performed, and patient medications and                            allergies were reviewed. The patient's tolerance of                            previous anesthesia was also reviewed. The risks                            and benefits of the procedure and the sedation                            options and risks were discussed with the patient.                            All questions were answered, and informed consent                            was obtained. Prior Anticoagulants: The patient has                            taken no previous anticoagulant or antiplatelet                            agents. ASA Grade Assessment: II - A patient with                            mild systemic disease. After reviewing the risks                            and benefits, the patient was deemed in                            satisfactory condition to undergo the procedure.  After obtaining informed consent, the colonoscope                            was passed under direct vision. Throughout the                            procedure, the patient's blood pressure, pulse, and                            oxygen saturations were monitored continuously. The                            Olympus CF-HQ190 [id number]) Colonoscope was                            introduced through the anus and  advanced to the                            cecum, identified by appendiceal orifice and                            ileocecal valve. The colonoscopy was performed                            without difficulty. The patient tolerated the                            procedure well. The quality of the bowel                            preparation was excellent. The ileocecal valve,                            appendiceal orifice, and rectum were photographed. Scope In: 3:27:55 PM Scope Out: 3:39:37 PM Scope Withdrawal Time: 0 hours 8 minutes 50 seconds  Total Procedure Duration: 0 hours 11 minutes 42 seconds  Findings:                 The digital rectal exam was normal.                           A 2 mm polyp was found in the cecum. The polyp was                            sessile. The polyp was removed with a cold biopsy                            forceps. Resection and retrieval were complete.                           A few small-mouthed diverticula were found in the                            sigmoid colon.  Internal hemorrhoids were found during                            retroflexion. The hemorrhoids were small.                           The exam was otherwise without abnormality. Complications:            No immediate complications. Estimated Blood Loss:     Estimated blood loss: none. Impression:               - One 2 mm polyp in the cecum, removed with a cold                            biopsy forceps. Resected and retrieved.                           - Diverticulosis in the sigmoid colon.                           - Small internal hemorrhoids.                           - The examination was otherwise normal. Recommendation:           - Patient has a contact number available for                            emergencies. The signs and symptoms of potential                            delayed complications were discussed with the                            patient.  Return to normal activities tomorrow.                            Written discharge instructions were provided to the                            patient.                           - Resume previous diet.                           - Continue present medications.                           - Await pathology results.                           - No repeat colonoscopy due to age. Jerene Bears, MD 04/10/2020 3:42:07 PM This report has been signed electronically.

## 2020-04-10 NOTE — Progress Notes (Signed)
Pt Drowsy. VSS. To PACU, report to RN. No anesthetic complications noted.  

## 2020-04-10 NOTE — Patient Instructions (Signed)

## 2020-04-10 NOTE — Progress Notes (Signed)
VS taken by C.W. 

## 2020-04-10 NOTE — Progress Notes (Signed)
Pt's states no medical or surgical changes since previsit or office visit. 

## 2020-04-12 ENCOUNTER — Telehealth: Payer: Self-pay | Admitting: *Deleted

## 2020-04-12 ENCOUNTER — Telehealth: Payer: Self-pay

## 2020-04-12 NOTE — Telephone Encounter (Signed)
First follow up call attempt.  LVM. 

## 2020-04-12 NOTE — Telephone Encounter (Signed)
  Follow up Call-  Call back number 04/10/2020  Post procedure Call Back phone  # [phone]  Permission to leave phone message Yes    2 Patient questions:  Do you have a fever, pain , or abdominal swelling? No. Pain Score  0 *  Have you tolerated food without any problems? Yes.    Have you been able to return to your normal activities? Yes.    Do you have any questions about your discharge instructions: Diet   No. Medications  No. Follow up visit  No.  Do you have questions or concerns about your Care? No.

## 2020-04-19 ENCOUNTER — Encounter: Payer: Self-pay | Admitting: Internal Medicine

## 2020-06-19 ENCOUNTER — Other Ambulatory Visit: Payer: Self-pay

## 2020-06-19 ENCOUNTER — Ambulatory Visit (INDEPENDENT_AMBULATORY_CARE_PROVIDER_SITE_OTHER): Payer: PPO | Admitting: Emergency Medicine

## 2020-06-19 ENCOUNTER — Encounter: Payer: Self-pay | Admitting: Emergency Medicine

## 2020-06-19 VITALS — BP 150/90 | HR 51 | Temp 98.4°F | Ht 66.93 in | Wt 152.0 lb

## 2020-06-19 DIAGNOSIS — E559 Vitamin D deficiency, unspecified: Secondary | ICD-10-CM

## 2020-06-19 DIAGNOSIS — F0280 Dementia in other diseases classified elsewhere without behavioral disturbance: Secondary | ICD-10-CM | POA: Diagnosis not present

## 2020-06-19 DIAGNOSIS — E785 Hyperlipidemia, unspecified: Secondary | ICD-10-CM

## 2020-06-19 DIAGNOSIS — G309 Alzheimer's disease, unspecified: Secondary | ICD-10-CM | POA: Diagnosis not present

## 2020-06-19 DIAGNOSIS — I10 Essential (primary) hypertension: Secondary | ICD-10-CM

## 2020-06-19 DIAGNOSIS — K5790 Diverticulosis of intestine, part unspecified, without perforation or abscess without bleeding: Secondary | ICD-10-CM | POA: Diagnosis not present

## 2020-06-19 MED ORDER — ROSUVASTATIN CALCIUM 10 MG PO TABS: 10.0000 mg | ORAL_TABLET | Freq: Every day | ORAL | 3 refills | Status: DC

## 2020-06-19 MED ORDER — LISINOPRIL 10 MG PO TABS: 10.0000 mg | ORAL_TABLET | Freq: Every day | ORAL | 3 refills | Status: DC

## 2020-06-19 MED ORDER — DONEPEZIL HCL 10 MG PO TABS: 10.0000 mg | ORAL_TABLET | Freq: Every day | ORAL | 3 refills | Status: DC

## 2020-06-19 NOTE — Patient Instructions (Signed)
Enfermedad de Alzheimer Alzheimer's Disease La enfermedad de Alzheimer es una enfermedad cerebral que afecta la memoria, el pensamiento, el lenguaje y Microbiologist. Las personas con la enfermedad de Alzheimer pierden [employer], y la enfermedad empeora con [place]. La enfermedad de Alzheimer es una forma de demencia. Cules son las causas? Esta afeccin se desarrolla cuando una protena llamada beta-amiloide forma depsitos en el cerebro. Se desconoce la causa de la formacin de estos depsitos. La enfermedad de Alzheimer tambin puede deberse a una mutacin gentica que se hereda de uno de los [place] o de [place]. Una mutacin gentica es un cambio perjudicial en un gen. No todas las personas que heredan la mutacin gentica contraern la enfermedad. Qu incrementa el riesgo? Es ms probable que tengan esta afeccin las personas que:  Son [place] de 35 aos de [place].  Son [place].  Tienen alguna de estas afecciones: ? Presin arterial alta. ? Diabetes. ? Cardiopata coronaria o enfermedad de los vasos sanguneos.  Fuman.  Tienen obesidad.  Tuvieron una lesin cerebral.  Sufrieron un accidente cerebrovascular.  Tienen antecedentes familiares de demencia. Cules son los signos o sntomas? Los sntomas de esta afeccin pueden ocurrir en tres etapas, que frecuentemente se superponen. Etapa temprana En esta etapa, puede seguir siendo independiente. An puede ser capaz de Forensic psychologist, trabajar y Paediatric nurse. Los sntomas en esta etapa incluyen lo siguiente:  Problemas de memoria leves, como olvidar un [place], [place], o lo que hizo recientemente.  Dificultad para hacer lo siguiente: ? Prestar atencin. ? Comunicarse. ? Realizar tareas cotidianas. ? Resolver problemas o hacer clculos. ? Seguir instrucciones. ? Aprender cosas nuevas.  Ansiedad.  Aislamiento social.  Prdida de la motivacin. Etapa moderada En esta etapa, comenzar a necesitar asistencia. Los  sntomas en esta etapa incluyen lo siguiente:  Dificultad para expresar pensamientos.  Prdida de memoria que afecta la vida cotidiana. Esto puede incluir olvidarse de lo siguiente: ? Eventos recientes que ocurrieron. ? Si ha tomado medicamentos o ha comido. ? Lugares familiares. Puede perderse mientras camina o conduce. ? Pagar facturas o gestionar finanzas. ? Higiene personal, como baarse o usar el bao.  Confusin sobre dnde est o qu hora es.  Dificultad para juzgar la distancia.  Cambios en la personalidad, el estado de nimo y Microbiologist. Sentirse deprimido, irritarse, enojarse, frustrarse, sentir temor o ansiedad, o desconfiar.  Falta de razonamiento y juicio de Geographical information systems officer.  Delirios o alucinaciones.  Cambios en los patrones de sueo. Etapa grave En la etapa final, necesitar ayuda con el cuidado personal y las actividades diarias. Los sntomas en esta etapa incluyen lo siguiente:  Empeoramiento de la prdida de Sales promotion account executive.  Cambios en la personalidad.  Prdida de conciencia del entorno que lo rodea.  Cambios en las habilidades fsicas, incluida la capacidad de [place], sentarse y tragar.  Dificultad para comunicarse.  Incapacidad para controlar la vejiga y el intestino.  Aumento de la confusin.  Aumento de los [employer]. Cmo se diagnostica? Esta afeccin la diagnostica un mdico que se especializa en enfermedades del sistema nervioso (neurlogo) o uno que se especializa en la atencin de las personas mayores (geriatra o psiquiatra geritrico). Tambin pueden descartarse otras causas de demencia. El mdico hablar con usted y con su familia, amigos o cuidadores acerca de sus antecedentes y sntomas. Se analizarn los antecedentes mdicos exhaustivamente, y se Teacher, early years/pre un examen fsico y Building services engineer. Las pruebas pueden incluir las siguientes:  Pruebas de laboratorio, como pruebas de [place] u [place].  Estudios de  diagnstico por imgenes,  como una exploracin por tomografa computarizada (TC), una tomografa por emisin de positrones o una resonancia magntica (RM).  Una puncin lumbar. En esta prueba, se extrae y se examina una pequea cantidad del lquido que rodea el cerebro y la mdula espinal.  Un electroencefalograma (EEG). En esta prueba, se usan pequeos discos metlicos para medir la Advertising account executive.  Pruebas de [place], pruebas cognitivas y pruebas neuropsicolgicas. Estas pruebas evalan la funcin cerebral.  Pruebas genticas. Estas pueden realizarse si la enfermedad aparece de forma temprana (antes de los 60aos) o si otros miembros de la familia tienen la enfermedad.   Cmo se trata? En este momento, no existe un tratamiento que cure la enfermedad de Alzheimer o que evite que empeore. Los [employer] del tratamiento son los siguientes:  Chief Technology Officer los cambios en el comportamiento.  Proporcionar un entorno seguro.  Ayudar a Education officer, environmental vida diaria para usted y sus cuidadores. Las formas de tratamiento disponibles son:  Chief Strategy Officer. Los medicamentos pueden ayudar a que la memoria funcione mejor y a Chief Technology Officer los sntomas conductuales.  Terapia cognitiva. La terapia cognitiva brinda educacin, apoyo y [place] para Sales promotion account executive. Es muy til en las primeras etapas de la enfermedad.  Psicoterapia o gua espiritual. Es normal que sus sentimientos flucten: enojarse, sentir [place], [place] y [place]. La psicoterapia y la orientacin espiritual pueden ayudar a lidiar con estos sentimientos.  Cuidadores. Esto implica que los cuidadores ayuden con las actividades diarias.  Grupos de apoyo para la familia. Estos brindan educacin, apoyo emocional e informacin acerca de los recursos de la comunidad para familiares que lo cuidan. Siga estas instrucciones en su casa: Medicamentos  Use los medicamentos de venta libre y los recetados solamente como se lo haya indicado el mdico.  Use un organizador o un  recordatorio de pldoras para ayudar a Air cabin crew.  Evite tomar medicamentos que puedan afectar el pensamiento, como analgsicos o somnferos. Estilo de vida  Es recomendable elegir un estilo de vida saludable que incluya algunas de las siguientes opciones: ? Hacer actividad fsica como se lo haya indicado el mdico. El ejercicio habitual puede ayudar a Teacher, English as a foreign language los sntomas. ? No consuma ningn producto que contenga nicotina o tabaco, como cigarrillos, cigarrillos electrnicos y tabaco de Higher education careers adviser. Si necesita ayuda para dejar de fumar, consulte al mdico. ? No beba alcohol. ? Siga una dieta saludable. ? Practique tcnicas de manejo del estrs cuando sufra de estrs. ? Mantenga una vida social.  Rochel Brome suficiente lquido como para mantener la orina de color amarillo plido.  Asegrese de Designer, television/film set. ? Evite tomar siestas Medical sales representative. Tome siestas cortas de 30 minutos o menos, si es necesario. ? Mantenga la habitacin oscura y fresca. ? Evite hacer ejercicio poco antes de acostarse. ? Evite consumir productos que contengan cafena por la tarde y la noche. Instrucciones generales  Consulte con el mdico para determinar con qu necesita ayuda y qu necesita en cuanto a su seguridad.  Si le dieron un brazalete que lo identifica como una persona con prdida de la memoria o que rastrea su ubicacin, asegrese de usarlo en todo momento.  Hable con su mdico acerca de si es seguro que conduzca.  Trabaje con su familia para tomar [employer], como las directivas anticipadas, el poder notarial mdico o el testamento vital.  Cumpla con todas las visitas de seguimiento. Esto es importante.   Dnde buscar ms informacin  The Alzheimer's Association (Asociacin de Alzheimer): Llame a la lnea  de [employer] 24 horas al [phone], o visite [email or web address] Comunquese con un mdico si:  Tiene nuseas, vmitos o dificultades para comer que estn  relacionados con un medicamento.  Tiene un empeoramiento del estado de nimo o cambios de comportamiento, como depresin, ansiedad o alucinaciones.  Usted o los miembros de la familia se preocupan por su seguridad. Solicite ayuda de inmediato si:  Se vuelve menos receptivo o le es difcil despertarse.  Su memoria empeora de repente.  Siente que quiere [employer]. Si alguna vez siente que puede lastimarse o Market researcher a Producer, television/film/video, o tiene pensamientos de poner fin a su vida, busque ayuda de inmediato. Dirjase al servicio de urgencias ms cercano o:  Comunquese con el servicio de emergencias de su localidad (911 en los [place]).  Llame a una lnea de asistencia al suicida y atencin en crisis como National Suicide Prevention Lifeline ([place]) al [phone]. Est disponible las 24 horas del da en los [place]  Enve un mensaje de texto a la lnea para casos de crisis al [phone] (en los EE[place].). Resumen  La enfermedad de Alzheimer es una enfermedad cerebral que afecta la memoria, el pensamiento, el lenguaje y el comportamiento. La enfermedad de Alzheimer es una forma de demencia.  Esta afeccin la diagnostica un especialista en enfermedades del sistema nervioso (neurlogo) o un especialista en atencin de las [employer].  En este momento, no existe un tratamiento que cure la enfermedad de Alzheimer o que evite que empeore. El [place] del tratamiento es ayudarlo a Chief Technology Officer los sntomas que tenga.  Trabaje con su familia para tomar [employer], como las directivas anticipadas, el poder notarial mdico o el testamento vital. Esta informacin no tiene Marine scientist el consejo del mdico. Asegrese de hacerle al mdico cualquier pregunta que tenga. Document Revised: 07/24/2019 Document Reviewed: 07/24/2019 Elsevier Patient Education  [place].

## 2020-06-19 NOTE — Assessment & Plan Note (Signed)
Stable.  Tolerating Aricept well.  Wife adjusting well and being very supportive. Advised to stay physically mentally active.

## 2020-06-19 NOTE — Progress Notes (Signed)
IMPRESSION: Abnormal MRI scan of the brain showing moderate degree of generalized cerebral atrophy which appears to be age disproportionate.  There are mild changes of chronic small vessel disease and moderate changes of chronic paranasal sinusitis and mild disc degenerative changes are noted in the upper cervical spine incidentally. INTERPRETING PHYSICIAN:  Antony Contras, MD Certified in  Neuroimaging by [place] of Neuroimaging and [employer] for Neurological Subspecialities Neurology office visit on 02/27/2020 as follows: Eric Floyd is a 75 year old right-handed Hispanic male with a 1 year history of changes in memory.  He comes in with his wife today who indicates that she has noted some significant changes in memory for least a year or year and a half prior to this visit.  The wife noted that he was having some increasing problems with driving, he was starting to get lost, she has not allowed him to drive over the last year.  She now has taken over the finances and is managing his medications over the last year, she also keeps up with the appointments.  The patient has short-term memory issues, he does have some problems with remembering names.  His older sister apparently also has memory problems.  The patient denies any headaches or dizziness, he denies numbness or weakness of the extremities or difficulty with balance or difficulty controlling the bowels or the bladder.  He is sleeping fairly well, but he has a tendency to nap during the day and stay awake and watch TV at night.  He is sent to this office for further evaluation.  He has had recent blood work done that shows a normal B12 level. Mental status: The patient is alert and oriented x 3 at the time of the examination. The Mini-Mental status examination done today shows a total score of 18/30 Assessment/Plan:  1.  Memory disturbance  The patient has had some progression of memory over the last year or year and a half.  The  patient will be started on Aricept at 5 mg at night, we will go up to 10 mg after 1 month if he is tolerating the drug.  He will be set up for MRI of the brain.  He will follow up here in 6 months.   Eric Alexanders MD 02/19/2020 3:09 PM  [facility] [address] [place], [place] [address]  Eric Floyd 75 y.o.   Chief Complaint  Patient presents with  . Annual Exam    HISTORY OF PRESENT ILLNESS: This is a 75 y.o. male here for follow-up of 12/20/2019 office visit when he presented to establish care with me and was found to have cognitive decline.  Was referred to neurologist.  Office visit notes from neurologist reviewed. MRI of brain report reviewed.  Patient was diagnosed with Alzheimer's disease and started on Aricept 10 mg at bedtime. Also recently had colonoscopy which showed diverticulosis but no signs of colon cancer.  1 benign polyp was removed. Doing well.  Lipid profile was abnormal and was started on rosuvastatin 10 mg daily.  Different blood pressure readings have been abnormal.  Will need blood pressure medication.  HPI   Prior to Admission medications   Medication Sig Start Date End Date Taking? Authorizing Provider  Ascorbic Acid (VITAMIN C) 100 MG tablet Take 500 mg by mouth daily.   Yes [provider]  Calcium Carb-Cholecalciferol (CALCIUM 600 + D PO) Take by mouth.   Yes [provider]  cetirizine (ZYRTEC) 10 MG tablet Take  10 mg by mouth daily.   Yes [provider]  donepezil (ARICEPT) 10 MG tablet Take 1 tablet (10 mg total) by mouth at bedtime. 03/19/20  Yes Kathrynn Ducking, MD  Multiple Vitamin (MULTIVITAMIN) tablet Take 1 tablet by mouth daily.   Yes [provider]  OVER THE COUNTER MEDICATION    Yes [provider]  OVER THE COUNTER MEDICATION    Yes [provider]  rosuvastatin (CRESTOR) 10 MG tablet Take 1 tablet (10 mg total) by mouth daily. 12/21/19   Yes Jozee Hammer, Ines Bloomer, MD  Cyanocobalamin (VITAMIN B-12 PO) Take by mouth. Patient not taking: Reported on 06/19/2020    [provider]  Vitamin D, Ergocalciferol, (DRISDOL) 1.25 MG (50000 UNIT) CAPS capsule TAKE 1 CAPSULE EVERY 7 DAYS Patient not taking: Reported on 06/19/2020 02/26/20   Horald Pollen, MD    Not on File  Patient Active Problem List   Diagnosis Date Noted  . Alzheimer's disease ([place]) 06/19/2020  . Memory difficulty 02/19/2020  . Vitamin D deficiency 12/21/2019  . Dyslipidemia 12/21/2019    Past Medical History:  Diagnosis Date  . Hyperlipidemia   . Memory difficulty 02/19/2020    Past Surgical History:  Procedure Laterality Date  . NO PAST SURGERIES      Social History   Socioeconomic History  . Marital status: Married    Spouse name: Not on file  . Number of children: Not on file  . Years of education: Not on file  . Highest education level: Not on file  Occupational History  . Not on file  Tobacco Use  . Smoking status: Never Smoker  . Smokeless tobacco: Never Used  Vaping Use  . Vaping Use: Never used  Substance and Sexual Activity  . Alcohol use: Yes    Alcohol/week: 21.0 standard drinks    Types: 21 Shots of liquor per week    Comment: 3 drinks a day per pt  . Drug use: Never  . Sexual activity: Not on file  Other Topics Concern  . Not on file  Social History Narrative   Lives with wife   Right handed   Drinks 1-2 cups caffeine daily   Social Determinants of Health   Financial Resource Strain: Not on file  Food Insecurity: Not on file  Transportation Needs: Not on file  Physical Activity: Not on file  Stress: Not on file  Social Connections: Not on file  Intimate Partner Violence: Not on file    Family History  Problem Relation Age of Onset  . Stroke Mother   . Heart disease Brother   . Heart attack Brother   . Ovarian cancer Niece   . Colon cancer Neg Hx   . Colon polyps Neg Hx   . Esophageal cancer  Neg Hx   . Rectal cancer Neg Hx   . Stomach cancer Neg Hx      Review of Systems  Constitutional: Negative.  Negative for chills and fever.  HENT: Negative.  Negative for congestion and sore throat.   Respiratory: Negative.  Negative for cough and shortness of breath.   Cardiovascular: Negative.  Negative for chest pain and palpitations.  Gastrointestinal: Negative.  Negative for abdominal pain, blood in stool, diarrhea, melena, nausea and vomiting.  Genitourinary: Negative.  Negative for dysuria and hematuria.  Musculoskeletal: Negative.  Negative for back pain, myalgias and neck pain.  Skin: Negative.  Negative for rash.  Neurological: Negative for dizziness and headaches.  All other systems  reviewed and are negative.   Today's Vitals   06/19/20 1056  BP: (!) 150/90  Pulse: (!) 51  Temp: 98.4 F (36.9 C)  TempSrc: Oral  SpO2: 98%  Weight: 152 lb (68.9 kg)  Height: 5' 6.93" (1.7 m)   Body mass index is 23.86 kg/m. BP Readings from Last 3 Encounters:  06/19/20 (!) 150/90  04/10/20 (!) 171/78  02/19/20 (!) 199/78    Physical Exam Vitals reviewed.  Constitutional:      Appearance: Normal appearance.  HENT:     Head: Normocephalic.     Mouth/Throat:     Mouth: Mucous membranes are moist.     Pharynx: Oropharynx is clear.  Eyes:     Extraocular Movements: Extraocular movements intact.     Conjunctiva/sclera: Conjunctivae normal.     Pupils: Pupils are equal, round, and reactive to light.  Cardiovascular:     Rate and Rhythm: Normal rate and regular rhythm.     Pulses: Normal pulses.     Heart sounds: Normal heart sounds.  Pulmonary:     Effort: Pulmonary effort is normal.     Breath sounds: Normal breath sounds.  Abdominal:     Palpations: Abdomen is soft.     Tenderness: There is no abdominal tenderness.  Musculoskeletal:        General: Normal range of motion.     Cervical back: Normal range of motion and neck supple. No tenderness.  Lymphadenopathy:      Cervical: No cervical adenopathy.  Skin:    General: Skin is warm and dry.     Capillary Refill: Capillary refill takes less than 2 seconds.  Neurological:     General: No focal deficit present.     Mental Status: He is alert and oriented to person, place, and time.  Psychiatric:        Mood and Affect: Mood normal.        Behavior: Behavior normal.      ASSESSMENT & PLAN: Alzheimer's disease ([place]) Stable.  Tolerating Aricept well.  Wife adjusting well and being very supportive. Advised to stay physically mentally active.  Dyslipidemia Continue rosuvastatin 10 mg daily.  Essential hypertension Several blood pressure readings show an elevated systolic pressure.  We will start lisinopril 10 mg daily.  Advised to start monitoring blood pressure readings at home and provide reports if persistently elevated.  Diet and nutrition discussed. Follow-up in 3 to 6 months.  Eric Floyd was seen today for annual exam.  Diagnoses and all orders for this visit:  Alzheimer's disease ([place]) -     donepezil (ARICEPT) 10 MG tablet; Take 1 tablet (10 mg total) by mouth at bedtime.  Dyslipidemia -     rosuvastatin (CRESTOR) 10 MG tablet; Take 1 tablet (10 mg total) by mouth daily. -     Lipid panel  Vitamin D deficiency  Diverticulosis  Essential hypertension -     lisinopril (ZESTRIL) 10 MG tablet; Take 1 tablet (10 mg total) by mouth daily. -     CBC with Differential/Platelet -     Comprehensive metabolic panel    Patient Instructions   Enfermedad de Alzheimer Alzheimer's Disease La enfermedad de Alzheimer es una enfermedad cerebral que afecta la memoria, el pensamiento, el lenguaje y Microbiologist. Las personas con la enfermedad de Alzheimer pierden [employer], y la enfermedad empeora con [place]. La enfermedad de Alzheimer es una forma de demencia. Cules son las causas? Esta afeccin se desarrolla cuando una protena llamada beta-amiloide forma  depsitos en el  cerebro. Se desconoce la causa de la formacin de estos depsitos. La enfermedad de Alzheimer tambin puede deberse a una mutacin gentica que se hereda de uno de los [place] o de [place]. Una mutacin gentica es un cambio perjudicial en un gen. No todas las personas que heredan la mutacin gentica contraern la enfermedad. Qu incrementa el riesgo? Es ms probable que tengan esta afeccin las personas que:  Son [place] de 13 aos de [place].  Son [place].  Tienen alguna de estas afecciones: ? Presin arterial alta. ? Diabetes. ? Cardiopata coronaria o enfermedad de los vasos sanguneos.  Fuman.  Tienen obesidad.  Tuvieron una lesin cerebral.  Sufrieron un accidente cerebrovascular.  Tienen antecedentes familiares de demencia. Cules son los signos o sntomas? Los sntomas de esta afeccin pueden ocurrir en tres etapas, que frecuentemente se superponen. Etapa temprana En esta etapa, puede seguir siendo independiente. An puede ser capaz de Forensic psychologist, trabajar y Paediatric nurse. Los sntomas en esta etapa incluyen lo siguiente:  Problemas de memoria leves, como olvidar un [place], [place], o lo que hizo recientemente.  Dificultad para hacer lo siguiente: ? Prestar atencin. ? Comunicarse. ? Realizar tareas cotidianas. ? Resolver problemas o hacer clculos. ? Seguir instrucciones. ? Aprender cosas nuevas.  Ansiedad.  Aislamiento social.  Prdida de la motivacin. Etapa moderada En esta etapa, comenzar a necesitar asistencia. Los sntomas en esta etapa incluyen lo siguiente:  Dificultad para expresar pensamientos.  Prdida de memoria que afecta la vida cotidiana. Esto puede incluir olvidarse de lo siguiente: ? Eventos recientes que ocurrieron. ? Si ha tomado medicamentos o ha comido. ? Lugares familiares. Puede perderse mientras camina o conduce. ? Pagar facturas o gestionar finanzas. ? Higiene personal, como baarse o usar el bao.  Confusin sobre dnde est o qu  hora es.  Dificultad para juzgar la distancia.  Cambios en la personalidad, el estado de nimo y Microbiologist. Sentirse deprimido, irritarse, enojarse, frustrarse, sentir temor o ansiedad, o desconfiar.  Falta de razonamiento y juicio de Geographical information systems officer.  Delirios o alucinaciones.  Cambios en los patrones de sueo. Etapa grave En la etapa final, necesitar ayuda con el cuidado personal y las actividades diarias. Los sntomas en esta etapa incluyen lo siguiente:  Empeoramiento de la prdida de Sales promotion account executive.  Cambios en la personalidad.  Prdida de conciencia del entorno que lo rodea.  Cambios en las habilidades fsicas, incluida la capacidad de [place], sentarse y tragar.  Dificultad para comunicarse.  Incapacidad para controlar la vejiga y el intestino.  Aumento de la confusin.  Aumento de los [employer]. Cmo se diagnostica? Esta afeccin la diagnostica un mdico que se especializa en enfermedades del sistema nervioso (neurlogo) o uno que se especializa en la atencin de las personas mayores (geriatra o psiquiatra geritrico). Tambin pueden descartarse otras causas de demencia. El mdico hablar con usted y con su familia, amigos o cuidadores acerca de sus antecedentes y sntomas. Se analizarn los antecedentes mdicos exhaustivamente, y se Teacher, early years/pre un examen fsico y Building services engineer. Las pruebas pueden incluir las siguientes:  Pruebas de laboratorio, como pruebas de [place] u [place].  Estudios de diagnstico por imgenes, como una exploracin por tomografa computarizada (TC), una tomografa por emisin de positrones o una resonancia magntica (RM).  Una puncin lumbar. En esta prueba, se extrae y se examina una pequea cantidad del lquido que rodea el cerebro y la mdula espinal.  Un electroencefalograma (EEG). En esta prueba, se usan pequeos discos metlicos para medir la Scientist, product/process development  en el cerebro.  Pruebas de [place], pruebas cognitivas y pruebas  neuropsicolgicas. Estas pruebas evalan la funcin cerebral.  Pruebas genticas. Estas pueden realizarse si la enfermedad aparece de forma temprana (antes de los 60aos) o si otros miembros de la familia tienen la enfermedad.   Cmo se trata? En este momento, no existe un tratamiento que cure la enfermedad de Alzheimer o que evite que empeore. Los [employer] del tratamiento son los siguientes:  Chief Technology Officer los cambios en el comportamiento.  Proporcionar un entorno seguro.  Ayudar a Education officer, environmental vida diaria para usted y sus cuidadores. Las formas de tratamiento disponibles son:  Chief Strategy Officer. Los medicamentos pueden ayudar a que la memoria funcione mejor y a Chief Technology Officer los sntomas conductuales.  Terapia cognitiva. La terapia cognitiva brinda educacin, apoyo y [place] para Sales promotion account executive. Es muy til en las primeras etapas de la enfermedad.  Psicoterapia o gua espiritual. Es normal que sus sentimientos flucten: enojarse, sentir [place], m[place] y [place]. La psicoterapia y la orientacin espiritual pueden ayudar a lidiar con estos sentimientos.  Cuidadores. Esto implica que los cuidadores ayuden con las actividades diarias.  Grupos de apoyo para la familia. Estos brindan educacin, apoyo emocional e informacin acerca de los recursos de la comunidad para familiares que lo cuidan. Siga estas instrucciones en su casa: Medicamentos  Use los medicamentos de venta libre y los recetados solamente como se lo haya indicado el mdico.  Use un organizador o un recordatorio de pldoras para ayudar a Air cabin crew.  Evite tomar medicamentos que puedan afectar el pensamiento, como analgsicos o somnferos. Estilo de vida  Es recomendable elegir un estilo de vida saludable que incluya algunas de las siguientes opciones: ? Hacer actividad fsica como se lo haya indicado el mdico. El ejercicio habitual puede ayudar a Teacher, English as a foreign language los sntomas. ? No consuma ningn producto que contenga nicotina o  tabaco, como cigarrillos, cigarrillos electrnicos y tabaco de Higher education careers adviser. Si necesita ayuda para dejar de fumar, consulte al mdico. ? No beba alcohol. ? Siga una dieta saludable. ? Practique tcnicas de manejo del estrs cuando sufra de estrs. ? Mantenga una vida social.  Rochel Brome suficiente lquido como para mantener la orina de color amarillo plido.  Asegrese de Designer, television/film set. ? Evite tomar siestas Medical sales representative. Tome siestas cortas de 30 minutos o menos, si es necesario. ? Mantenga la habitacin oscura y fresca. ? Evite hacer ejercicio poco antes de acostarse. ? Evite consumir productos que contengan cafena por la tarde y la noche. Instrucciones generales  Consulte con el mdico para determinar con qu necesita ayuda y qu necesita en cuanto a su seguridad.  Si le dieron un brazalete que lo identifica como una persona con prdida de la memoria o que rastrea su ubicacin, asegrese de usarlo en todo momento.  Hable con su mdico acerca de si es seguro que conduzca.  Trabaje con su familia para tomar [employer], como las directivas anticipadas, el poder notarial mdico o el testamento vital.  Cumpla con todas las visitas de seguimiento. Esto es importante.   Dnde buscar ms informacin  The Alzheimer's Association (Asociacin de Alzheimer): Llame a la lnea de ayuda disponible las 24 horas al [phone], o visite [email or web address] Comunquese con un mdico si:  Tiene nuseas, vmitos o dificultades para comer que estn relacionados con un medicamento.  Tiene un empeoramiento del estado de nimo o cambios de comportamiento, como depresin, ansiedad o alucinaciones.  Usted o los miembros de la familia se preocupan por su seguridad. Solicite ayuda de  inmediato si:  Se vuelve menos receptivo o le es difcil despertarse.  Su memoria empeora de repente.  Siente que quiere [employer]. Si alguna vez siente que puede lastimarse o Market researcher a Producer, television/film/video, o tiene  pensamientos de poner fin a su vida, busque ayuda de inmediato. Dirjase al servicio de urgencias ms cercano o:  Comunquese con el servicio de emergencias de su localidad (911 en los [place]).  Llame a una lnea de asistencia al suicida y atencin en crisis como National Suicide Prevention Lifeline ([place]) al [phone]. Est disponible las 24 horas del da en los [place].  Enve un mensaje de texto a la lnea para casos de crisis al [phone] (en los EE.UU.). Resumen  La enfermedad de Alzheimer es una enfermedad cerebral que afecta la memoria, el pensamiento, el lenguaje y el comportamiento. La enfermedad de Alzheimer es una forma de demencia.  Esta afeccin la diagnostica un especialista en enfermedades del sistema nervioso (neurlogo) o un especialista en atencin de las [employer].  En este momento, no existe un tratamiento que cure la enfermedad de Alzheimer o que evite que empeore. El [place] del tratamiento es ayudarlo a Chief Technology Officer los sntomas que tenga.  Trabaje con su familia para tomar [employer], como las directivas anticipadas, el poder notarial mdico o el testamento vital. Esta informacin no tiene Marine scientist el consejo del mdico. Asegrese de hacerle al mdico cualquier pregunta que tenga. Document Revised: 07/24/2019 Document Reviewed: 07/24/2019 Elsevier Patient Education  2021 [place], MD [facility]

## 2020-06-19 NOTE — Assessment & Plan Note (Signed)
Continue rosuvastatin 10 mg daily.

## 2020-06-19 NOTE — Assessment & Plan Note (Signed)
Several blood pressure readings show an elevated systolic pressure.  We will start lisinopril 10 mg daily.  Advised to start monitoring blood pressure readings at home and provide reports if persistently elevated.  Diet and nutrition discussed. Follow-up in 3 to 6 months.

## 2020-08-19 ENCOUNTER — Ambulatory Visit: Payer: Medicare HMO | Admitting: Neurology

## 2020-08-19 ENCOUNTER — Encounter: Payer: Self-pay | Admitting: Neurology

## 2020-08-19 ENCOUNTER — Telehealth: Payer: Self-pay | Admitting: Neurology

## 2020-08-19 VITALS — BP 148/92 | HR 40 | Ht 66.0 in | Wt 145.6 lb

## 2020-08-19 DIAGNOSIS — G309 Alzheimer's disease, unspecified: Secondary | ICD-10-CM

## 2020-08-19 DIAGNOSIS — F0280 Dementia in other diseases classified elsewhere without behavioral disturbance: Secondary | ICD-10-CM | POA: Diagnosis not present

## 2020-08-19 MED ORDER — MEMANTINE HCL 10 MG PO TABS: 10.0000 mg | ORAL_TABLET | Freq: Two times a day (BID) | ORAL | 5 refills | Status: DC

## 2020-08-19 NOTE — Patient Instructions (Addendum)
Stop the Aricept, stay off for 2 weeks, then you can start the Namenda 10 mg twice daily, start taking 1/2 tablet twice daily for 2 weeks, then take 1 tablet twice daily   Please see your primary care doctor to follow-up on the low heart rate  Any report of dizziness, light headheaded, chest pain, go to the ER  See you back in 6 months

## 2020-08-19 NOTE — Progress Notes (Signed)
I have read the note, and I agree with the clinical assessment and plan.  Tiann Saha K Lener Ventresca   

## 2020-08-19 NOTE — Progress Notes (Signed)
PATIENT: Eric Floyd DOB: November 06, 1945  REASON FOR VISIT: follow up HISTORY FROM: patient Primary Neurologist: Dr. Jannifer Franklin  HISTORY OF PRESENT ILLNESS: Today 08/19/20 Eric Floyd is a 75 year old male with history of memory disturbance.  MRI of the brain showed generalized atrophy consistent with Alzheimer's disease, mild white matter changes. MMSE 16/30. Has trouble remembering where to put things, forgets what his wife send him for in the house. Not driving anymore. Wife manages his finances. Spends his day reading, watching TV. Takes naps during day, sometimes trouble sleeping at night if naps during day. At times gets upset at his wife, frustrated when she corrects him. He has 3 mixed drinks daily, more on the weekend. Has fallen out of bed twice. Lost 8 lbs since last seen. HR is noted to be 40 today, is asymptomatic.  HISTORY 02/19/2020 Dr. Jannifer Franklin: Eric Floyd is a 83 year old right-handed Hispanic male with a 1 year history of changes in memory.  He comes in with his wife today who indicates that she has noted some significant changes in memory for least a year or year and a half prior to this visit.  The wife noted that he was having some increasing problems with driving, he was starting to get lost, she has not allowed him to drive over the last year.  She now has taken over the finances and is managing his medications over the last year, she also keeps up with the appointments.  The patient has short-term memory issues, he does have some problems with remembering names.  His older sister apparently also has memory problems.  The patient denies any headaches or dizziness, he denies numbness or weakness of the extremities or difficulty with balance or difficulty controlling the bowels or the bladder.  He is sleeping fairly well, but he has a tendency to nap during the day and stay awake and watch TV at night.  He is sent to this office for further evaluation.  He has had recent blood work  done that shows a normal B12 level.  REVIEW OF SYSTEMS: Out of a complete 14 system review of symptoms, the patient complains only of the following symptoms, and all other reviewed systems are negative.   See HPI  ALLERGIES: Not on File  HOME MEDICATIONS: Outpatient Medications Prior to Visit  Medication Sig Dispense Refill   Ascorbic Acid (VITAMIN C) 100 MG tablet Take 500 mg by mouth daily.     Calcium Carb-Cholecalciferol (CALCIUM 600 + D PO) Take by mouth.     cetirizine (ZYRTEC) 10 MG tablet Take 10 mg by mouth daily.     Cyanocobalamin (VITAMIN B-12 PO) Take by mouth.     donepezil (ARICEPT) 10 MG tablet Take 1 tablet (10 mg total) by mouth at bedtime. 90 tablet 3   lisinopril (ZESTRIL) 10 MG tablet Take 1 tablet (10 mg total) by mouth daily. 90 tablet 3   Multiple Vitamin (MULTIVITAMIN) tablet Take 1 tablet by mouth daily.     OVER THE COUNTER MEDICATION      OVER THE COUNTER MEDICATION      rosuvastatin (CRESTOR) 10 MG tablet Take 1 tablet (10 mg total) by mouth daily. 90 tablet 3   Vitamin D, Ergocalciferol, (DRISDOL) 1.25 MG (50000 UNIT) CAPS capsule TAKE 1 CAPSULE EVERY 7 DAYS 7 capsule 0   No facility-administered medications prior to visit.    PAST MEDICAL HISTORY: Past Medical History:  Diagnosis Date   Hyperlipidemia    Memory difficulty 02/19/2020  PAST SURGICAL HISTORY: Past Surgical History:  Procedure Laterality Date   NO PAST SURGERIES      FAMILY HISTORY: Family History  Problem Relation Age of Onset   Stroke Mother    Heart disease Brother    Heart attack Brother    Ovarian cancer Niece    Colon cancer Neg Hx    Colon polyps Neg Hx    Esophageal cancer Neg Hx    Rectal cancer Neg Hx    Stomach cancer Neg Hx     SOCIAL HISTORY: Social History   Socioeconomic History   Marital status: Married    Spouse name: Not on file   Number of children: Not on file   Years of education: Not on file   Highest education level: Not on file   Occupational History   Not on file  Tobacco Use   Smoking status: Never   Smokeless tobacco: Never  Vaping Use   Vaping Use: Never used  Substance and Sexual Activity   Alcohol use: Yes    Alcohol/week: 21.0 standard drinks    Types: 21 Shots of liquor per week    Comment: 3 drinks a day per pt   Drug use: Never   Sexual activity: Not on file  Other Topics Concern   Not on file  Social History Narrative   Lives with wife   Right handed   Drinks 1-2 cups caffeine daily   Social Determinants of Health   Financial Resource Strain: Not on file  Food Insecurity: Not on file  Transportation Needs: Not on file  Physical Activity: Not on file  Stress: Not on file  Social Connections: Not on file  Intimate Partner Violence: Not on file   PHYSICAL EXAM  Vitals:   08/19/20 1440  BP: (!) 148/92  Pulse: (!) 40  Weight: 145 lb 9.6 oz (66 kg)  Height: 5\' 6"  (1.676 m)   Body mass index is 23.5 kg/m.  Generalized: Well developed, in no acute distress  MMSE - Mini Mental State Exam 08/19/2020 02/19/2020  Orientation to time 1 1  Orientation to Place 3 3  Registration 3 3  Attention/ Calculation 1 3  Recall 0 0  Language- name 2 objects 2 2  Language- repeat 0 0  Language- repeat-comments UTO- language barrier -  Language- follow 3 step command 3 3  Language- read & follow direction 1 1  Write a sentence 1 1  Copy design 1 1  Total score 16 18    Neurological examination  Mentation: Alert, cooperative, history is mostly provided by his wife. Follows all commands speech and language fluent Cranial nerve II-XII: Pupils were equal round reactive to light. Extraocular movements were full, visual field were full on confrontational test. Facial sensation and strength were normal. Head turning and shoulder shrug  were normal and symmetric. Motor: The motor testing reveals 5 over 5 strength of all 4 extremities. Good symmetric motor tone is noted throughout.  Sensory: Sensory  testing is intact to soft touch on all 4 extremities. No evidence of extinction is noted.  Coordination: Cerebellar testing reveals good finger-nose-finger and heel-to-shin bilaterally.  Gait and station: Gait is normal.  Reflexes: Deep tendon reflexes are symmetric and normal bilaterally.   DIAGNOSTIC DATA (LABS, IMAGING, TESTING) - I reviewed patient records, labs, notes, testing and imaging myself where available.  Lab Results  Component Value Date   WBC 2.9 (L) 12/20/2019   HGB 15.7 12/20/2019   HCT 46.1 12/20/2019  MCV 95 12/20/2019   PLT 240 12/20/2019      Component Value Date/Time   NA 140 12/20/2019 1159   K 4.3 12/20/2019 1159   CL 101 12/20/2019 1159   CO2 24 12/20/2019 1159   GLUCOSE 90 12/20/2019 1159   BUN 15 12/20/2019 1159   CREATININE 0.93 12/20/2019 1159   CALCIUM 9.4 12/20/2019 1159   PROT 7.7 12/20/2019 1159   ALBUMIN 4.5 12/20/2019 1159   AST 25 12/20/2019 1159   ALT 13 12/20/2019 1159   ALKPHOS 71 12/20/2019 1159   BILITOT 0.5 12/20/2019 1159   GFRNONAA 81 12/20/2019 1159   GFRAA 94 12/20/2019 1159   Lab Results  Component Value Date   CHOL 257 (H) 12/20/2019   HDL 50 12/20/2019   LDLCALC 171 (H) 12/20/2019   TRIG 192 (H) 12/20/2019   CHOLHDL 5.1 (H) 12/20/2019   Lab Results  Component Value Date   HGBA1C 5.5 12/20/2019   Lab Results  Component Value Date   VITAMINB12 1,337 (H) 12/20/2019   Lab Results  Component Value Date   TSH 1.850 12/20/2019   ASSESSMENT AND PLAN 76 y.o. year old male  has a past medical history of Hyperlipidemia and Memory difficulty (02/19/2020). here with:  1.  Alzheimer's disease, MMSE 16/30 2.  Bradycardia  -Stop Aricept, given heart rate 40, EKG tracing here looks like sinus bradycardia, bradycardia could be related to Aricept, he is asymptomatic -Will send a note to his PCP, to follow-up on bradycardia, improvement off Aricept? -Can still start Namenda, but will wait 2 weeks before initiating before  adding any new medication -Recommend he decrease his alcohol consumption, as this is when he can be agitated, he seems okay with this -MRI of the brain has showed generalized atrophy consistent with Alzheimer's disease -Follow-up here in 6 months or sooner if needed  I spent 35 minutes of face-to-face and non-face-to-face time with patient.  This included previsit chart review, lab review, study review, order entry, discussing memory score, HR, medications, management, and follow-up.  Butler Denmark, AGNP-C, DNP 08/19/2020, 3:07 PM [facility] [address], [place], [place] [address] [phone]

## 2020-08-19 NOTE — Telephone Encounter (Signed)
Will you please fax a copy of our EKG tracing over to PCP, ask them to have the patient seen given bradycardia, asymptomatic. I will stop Aricept today.

## 2020-08-29 ENCOUNTER — Ambulatory Visit (INDEPENDENT_AMBULATORY_CARE_PROVIDER_SITE_OTHER): Payer: PPO | Admitting: Emergency Medicine

## 2020-08-29 ENCOUNTER — Encounter: Payer: Self-pay | Admitting: Emergency Medicine

## 2020-08-29 ENCOUNTER — Other Ambulatory Visit: Payer: Self-pay

## 2020-08-29 VITALS — BP 138/62 | HR 39 | Temp 98.1°F | Ht 66.0 in | Wt 147.0 lb

## 2020-08-29 DIAGNOSIS — I10 Essential (primary) hypertension: Secondary | ICD-10-CM

## 2020-08-29 DIAGNOSIS — G309 Alzheimer's disease, unspecified: Secondary | ICD-10-CM

## 2020-08-29 DIAGNOSIS — R001 Bradycardia, unspecified: Secondary | ICD-10-CM | POA: Insufficient documentation

## 2020-08-29 DIAGNOSIS — E785 Hyperlipidemia, unspecified: Secondary | ICD-10-CM | POA: Diagnosis not present

## 2020-08-29 DIAGNOSIS — F0280 Dementia in other diseases classified elsewhere without behavioral disturbance: Secondary | ICD-10-CM | POA: Diagnosis not present

## 2020-08-29 LAB — CBC WITH DIFFERENTIAL/PLATELET
Basophils Absolute: 0 10*3/uL (ref 0.0–0.1)
Basophils Relative: 0.9 % (ref 0.0–3.0)
Eosinophils Absolute: 0.1 10*3/uL (ref 0.0–0.7)
Eosinophils Relative: 3.9 % (ref 0.0–5.0)
HCT: 41.8 % (ref 39.0–52.0)
Hemoglobin: 14.4 g/dL (ref 13.0–17.0)
Lymphocytes Relative: 40.6 % (ref 12.0–46.0)
Lymphs Abs: 1.3 10*3/uL (ref 0.7–4.0)
MCHC: 34.3 g/dL (ref 30.0–36.0)
MCV: 93 fl (ref 78.0–100.0)
Monocytes Absolute: 0.3 10*3/uL (ref 0.1–1.0)
Monocytes Relative: 8.5 % (ref 3.0–12.0)
Neutro Abs: 1.5 10*3/uL (ref 1.4–7.7)
Neutrophils Relative %: 46.1 % (ref 43.0–77.0)
Platelets: 211 10*3/uL (ref 150.0–400.0)
RBC: 4.5 Mil/uL (ref 4.22–5.81)
RDW: 13.2 % (ref 11.5–15.5)
WBC: 3.2 10*3/uL — ABNORMAL LOW (ref 4.0–10.5)

## 2020-08-29 LAB — LIPID PANEL
Cholesterol: 141 mg/dL (ref 0–200)
HDL: 46.9 mg/dL (ref >39.00)
LDL Cholesterol: 69 mg/dL (ref 0–99)
NonHDL: 94.33
Total CHOL/HDL Ratio: 3
Triglycerides: 128 mg/dL (ref 0.0–149.0)
VLDL: 25.6 mg/dL (ref 0.0–40.0)

## 2020-08-29 LAB — COMPREHENSIVE METABOLIC PANEL
ALT: 15 U/L (ref 0–53)
AST: 22 U/L (ref 0–37)
Albumin: 4.4 g/dL (ref 3.5–5.2)
Alkaline Phosphatase: 45 U/L (ref 39–117)
BUN: 21 mg/dL (ref 6–23)
CO2: 31 mEq/L (ref 19–32)
Calcium: 10 mg/dL (ref 8.4–10.5)
Chloride: 99 mEq/L (ref 96–112)
Creatinine, Ser: 0.98 mg/dL (ref 0.40–1.50)
GFR: 76.2 mL/min (ref >60.00)
Glucose, Bld: 86 mg/dL (ref 70–99)
Potassium: 4.5 mEq/L (ref 3.5–5.1)
Sodium: 136 mEq/L (ref 135–145)
Total Bilirubin: 0.4 mg/dL (ref 0.2–1.2)
Total Protein: 7.8 g/dL (ref 6.0–8.3)

## 2020-08-29 LAB — MAGNESIUM: Magnesium: 2.2 mg/dL (ref 1.5–2.5)

## 2020-08-29 LAB — TSH: TSH: 1.72 u[IU]/mL (ref 0.35–5.50)

## 2020-08-29 NOTE — Assessment & Plan Note (Signed)
Stable.  Recent neurologist office visit notes reviewed.  Medication change noted.

## 2020-08-29 NOTE — Patient Instructions (Signed)
Bradicardia, en adultos Bradycardia, Adult La bradicardia es una frecuencia cardaca ms lenta que lo normal. El corazn late [employer] 37 y 100 veces por minuto en un adulto, en reposo. Si presenta bradicardia, la frecuencia cardaca en reposo es inferior a 60 latidospor minuto. La bradicardia puede impedir que llegue la cantidad suficiente de oxgeno a ciertas zonas del cuerpo cuando est realizando [place]. Puede ser grave si impide que llegue la suficiente cantidad de oxgeno al cerebro y a otras partes del cuerpo. La bradicardia no representa un problema para todas las personas. Para algunos adultos sanos, una frecuencia cardaca lenta enreposo es normal. Cules son las causas? Esta afeccin puede ser causada por lo siguiente: Problemas con el corazn, estos incluyen: Un problema con el sistema elctrico del corazn, por ejemplo, un bloqueo cardaco. Con un bloqueo cardaco, las seales elctricas [employer] cavidades del corazn estn parcial o completamente cerradas, de modo que no pueden funcionar como deberan. Un problema con el marcapasos natural del corazn (ndulo sinusal). Enfermedad cardaca. Infarto de miocardio. Dao cardaco. Enfermedad de [place]. Infeccin en el corazn. Afeccin cardaca que est presente al nacer (defecto cardaco congnito). Determinados medicamentos para el tratamiento de afecciones cardacas. Determinadas afecciones, como hipotiroidismo y apnea obstructiva del sueo. Problemas con el equilibrio de sustancias qumicas y otras sustancias en la sangre, como el [place]. Traumatismos. Radioterapia. Qu incrementa el riesgo? Es ms probable que tenga esta afeccin si: Es mayor de 22 aos de edad. Tiene presin arterial alta (hipertensin), colesterol alto (hiperlipidemia) o diabetes. Toma mucho alcohol, consume productos con tabaco o nicotina o [place] drogas. Cules son los signos o los sntomas? Los sntomas de esta afeccin  incluyen: Desvanecimiento. Desmayo o sensacin de desvanecimiento. Fatiga y debilidad. Dificultad para realizar Scientist, forensic ejercicio. Falta de aire. Dolor en el pecho (angina). Somnolencia. Confusin. Mareos. Cmo se diagnostica? Esta afeccin se puede diagnosticar en funcin de lo siguiente: Sus sntomas. Sus antecedentes mdicos. Un examen fsico. Durante el examen, el mdico auscultar la frecuencia cardaca y controlar el pulso. El mdico tambin puede indicarle estudios para confirmar el diagnstico, como los siguientes: Anlisis de [place]. Electrocardiograma (ECG). El electrocardiograma registra la actividad elctrica del corazn. El estudio puede mostrar el ritmo del latido cardaco y si la frecuencia cardaca se mantiene. Un estudio en el que se utiliza un dispositivo porttil (grabadora de eventos) o Holter para registrar la actividad elctrica del corazn mientras realiza sus actividades cotidianas. Una prueba de ejercicio. Cmo se trata? El tratamiento de esta afeccin depende de la intensidad de los sntomas y de la causa de Engineer, manufacturing systems. El tratamiento puede implicar lo siguiente: Tratamiento de la afeccin preexistente. Cambiar los medicamentos o la dosis de [place]. Implantar debajo de la piel un dispositivo a batera pequeo llamado marcapasos. Cuando se produce un episodio de bradicardia, este dispositivo puede utilizarse para aumentar la frecuencia cardaca y ayudar a que su corazn lata a un ritmo constante. Siga estas indicaciones en su casa: Estilo de [place] indicaciones del mdico para controlar cualquier enfermedad que contribuya a la bradicardia. Siga una dieta cardiosaludable. Un especialista en nutricin (nutricionista) puede darle instrucciones sobre opciones y variantes de alimentos saludables. Siga un programa de actividad fsica aprobado por el mdico. Mantenga un peso saludable. Trate de reducir o Advice worker de estrs con  actividades como el yoga o la meditacin. Si necesita ayuda para reducir [employer] de estrs, consulte al mdico. No consuma ningn producto que contenga nicotina o tabaco, como cigarrillos,  cigarrillos electrnicos y tabaco de Higher education careers adviser. Si necesita ayuda para dejar de consumir, consulte al mdico. No consuma drogas ilegales. Limite el consumo de alcohol a no ms de 1 medida por da si es mujer y no est Music therapist, y 2 medidas por da si es hombre. Est atento a la cantidad de alcohol que hay en las bebidas que toma. En los [place], una medida equivale a una botella de cerveza de 12 oz (355 ml), un vaso de vino de 5 oz (148 ml) o un vaso de una bebida alcohlica de alta graduacin de 1 oz (44 ml).  Indicaciones generales [employer] de venta libre y los recetados solamente como se lo haya indicado el mdico. Consulting civil engineer a todas las visitas de control como se lo haya indicado el mdico. Esto es importante. Cmo se evita? En algunos casos, la bradicardia se puede evitar a travs de: El tratamiento de problemas mdicos preexistentes. Evitar las conductas o medicamentos que pueden Geographical information systems officer. Comunquese con un mdico si: Se siente mareado o siente que va a desvanecerse. Casi se desmaya. Se siente dbil o se fatiga con facilidad durante la actividad fsica. Siente confusin o tiene problemas de [place]. Solicite ayuda inmediatamente si: Se desmaya. Tiene los siguientes sntomas: Latidos cardacos irregulares (palpitaciones). Dolor en el pecho. Dificultad para respirar. Resumen La bradicardia es una frecuencia cardaca ms lenta que lo normal. Si presenta bradicardia, la frecuencia cardaca en reposo es inferior a 60 latidos por minuto. El tratamiento de esta afeccin depende de la causa. Siga las indicaciones del mdico para controlar cualquier enfermedad que contribuya a la bradicardia. No consuma ningn producto que contenga nicotina o tabaco, como cigarrillos,  cigarrillos electrnicos y tabaco de Higher education careers adviser, y limite el consumo de alcohol. Concurra a todas las visitas de control como se lo haya indicado el mdico. Esto es importante. Esta informacin no tiene Marine scientist el consejo del mdico. Asegresede hacerle al mdico cualquier pregunta que tenga. Document Revised: 09/27/2017 Document Reviewed: 09/27/2017 Elsevier Patient Education  [place].

## 2020-08-29 NOTE — Assessment & Plan Note (Signed)
Asymptomatic and normotensive.  No syncopal episodes.  Needs evaluation by cardiologist.

## 2020-08-29 NOTE — Progress Notes (Signed)
Eric Floyd 75 y.o.   Chief Complaint  Patient presents with   Follow-up    On low heart rate; hands cold and purple fingers x 2-3 weeks    HISTORY OF PRESENT ILLNESS: This is a 75 y.o. male with history of Alzheimer's dementia and recently evaluated by neurologist 10 days ago.  Noted to have bradycardia and advised to follow-up with PCP.  Patient not on beta-blockers or calcium channel blockers at present time. Taking lisinopril 10 mg daily for mild hypertension.  Also takes rosuvastatin 10 mg daily. Patient asymptomatic.  Denies lightheadedness, dizziness, chest pain, difficulty breathing, nausea or vomiting, headaches or syncopal episodes. Wife also concerned about erratic sleeping pattern and recent mild weight loss. No other complaints or medical concerns today.  HPI   Prior to Admission medications   Medication Sig Start Date End Date Taking? Authorizing Provider  Ascorbic Acid (VITAMIN C) 100 MG tablet Take 500 mg by mouth daily.   Yes [provider]  Calcium Carb-Cholecalciferol (CALCIUM 600 + D PO) Take by mouth.   Yes [provider]  cetirizine (ZYRTEC) 10 MG tablet Take 10 mg by mouth daily.   Yes [provider]  Cyanocobalamin (VITAMIN B-12 PO) Take by mouth.   Yes [provider]  lisinopril (ZESTRIL) 10 MG tablet Take 1 tablet (10 mg total) by mouth daily. 06/19/20  Yes Rafe Mackowski, Ines Bloomer, MD  Multiple Vitamin (MULTIVITAMIN) tablet Take 1 tablet by mouth daily.   Yes [provider]  OVER THE COUNTER MEDICATION    Yes [provider]  OVER THE COUNTER MEDICATION    Yes [provider]  rosuvastatin (CRESTOR) 10 MG tablet Take 1 tablet (10 mg total) by mouth daily. 06/19/20  Yes Ludwin Flahive, Ines Bloomer, MD  Vitamin D, Ergocalciferol, (DRISDOL) 1.25 MG (50000 UNIT) CAPS capsule TAKE 1 CAPSULE EVERY 7 DAYS 02/26/20  Yes Izaiah Tabb, Ines Bloomer, MD  memantine (NAMENDA) 10 MG tablet Take 1 tablet (10 mg  total) by mouth 2 (two) times daily. Patient not taking: Reported on 08/29/2020 08/19/20   Suzzanne Cloud, NP    No Known Allergies  Patient Active Problem List   Diagnosis Date Noted   Alzheimer's disease ([place]) 06/19/2020   Essential hypertension 06/19/2020   Diverticulosis 06/19/2020   Memory difficulty 02/19/2020   Vitamin D deficiency 12/21/2019   Dyslipidemia 12/21/2019    Past Medical History:  Diagnosis Date   Hyperlipidemia    Memory difficulty 02/19/2020    Past Surgical History:  Procedure Laterality Date   NO PAST SURGERIES      Social History   Socioeconomic History   Marital status: Married    Spouse name: Not on file   Number of children: Not on file   Years of education: Not on file   Highest education level: Not on file  Occupational History   Not on file  Tobacco Use   Smoking status: Never   Smokeless tobacco: Never  Vaping Use   Vaping Use: Never used  Substance and Sexual Activity   Alcohol use: Yes    Alcohol/week: 21.0 standard drinks    Types: 21 Shots of liquor per week    Comment: 3 drinks a day per pt   Drug use: Never   Sexual activity: Not on file  Other Topics Concern   Not on file  Social History Narrative   Lives with wife   Right handed   Drinks 1-2 cups caffeine daily   Social Determinants of  Health   Financial Resource Strain: Not on file  Food Insecurity: Not on file  Transportation Needs: Not on file  Physical Activity: Not on file  Stress: Not on file  Social Connections: Not on file  Intimate Partner Violence: Not on file    Family History  Problem Relation Age of Onset   Stroke Mother    Heart disease Brother    Heart attack Brother    Ovarian cancer Niece    Colon cancer Neg Hx    Colon polyps Neg Hx    Esophageal cancer Neg Hx    Rectal cancer Neg Hx    Stomach cancer Neg Hx      Review of Systems  Constitutional: Negative.  Negative for chills and fever.  HENT: Negative.  Negative for congestion  and sore throat.   Respiratory: Negative.  Negative for cough and shortness of breath.   Cardiovascular:  Negative for chest pain and palpitations.  Gastrointestinal: Negative.  Negative for abdominal pain, blood in stool, diarrhea, melena, nausea and vomiting.  Genitourinary: Negative.  Negative for dysuria and hematuria.  Musculoskeletal: Negative.  Negative for back pain, myalgias and neck pain.  Skin: Negative.  Negative for rash.  Neurological:  Negative for dizziness, sensory change and headaches.  All other systems reviewed and are negative.  Today's Vitals   08/29/20 1118  BP: 138/62  Pulse: (!) 39  Temp: 98.1 F (36.7 C)  TempSrc: Oral  SpO2: 98%  Weight: 147 lb (66.7 kg)  Height: 5\' 6"  (1.676 m)   Body mass index is 23.73 kg/m. Wt Readings from Last 3 Encounters:  08/29/20 147 lb (66.7 kg)  08/19/20 145 lb 9.6 oz (66 kg)  06/19/20 152 lb (68.9 kg)    Physical Exam Vitals reviewed.  Constitutional:      Appearance: Normal appearance.  HENT:     Head: Normocephalic.  Eyes:     Extraocular Movements: Extraocular movements intact.     Pupils: Pupils are equal, round, and reactive to light.  Cardiovascular:     Rate and Rhythm: Regular rhythm. Bradycardia present.     Pulses: Normal pulses.     Heart sounds: Normal heart sounds.  Pulmonary:     Effort: Pulmonary effort is normal.     Breath sounds: Normal breath sounds.  Abdominal:     General: There is no distension.     Palpations: Abdomen is soft.     Tenderness: There is no abdominal tenderness.  Musculoskeletal:        General: Normal range of motion.     Cervical back: Normal range of motion and neck supple. No tenderness.  Lymphadenopathy:     Cervical: No cervical adenopathy.  Skin:    General: Skin is warm and dry.     Capillary Refill: Capillary refill takes less than 2 seconds.  Neurological:     General: No focal deficit present.     Mental Status: He is alert and oriented to person, place,  and time.  Psychiatric:        Mood and Affect: Mood normal.        Behavior: Behavior normal.   EKG: Sinus bradycardia at a rate of 39/min.  No acute ischemic changes.  No changes from previous EKG last December.  LVH by voltage criteria.    ASSESSMENT & PLAN: A total of 30 minutes was spent with the patient and counseling/coordination of care regarding preparation for the visit, review of most recent office visit note, review  of most recent neurologist office visit note, review of all medications, review of most recent blood work results, differential diagnosis of sinus bradycardia and need for cardiology evaluation, education on nutrition, sleeping concerns, recent weight loss issues and need for blood work, prognosis, documentation and need for follow-up.  Essential hypertension Well-controlled hypertension.  Continue lisinopril 10 mg daily.  Alzheimer's disease ([place]) Stable.  Recent neurologist office visit notes reviewed.  Medication change noted.  Dyslipidemia Diet and nutrition discussed.  Continue rosuvastatin 10 mg daily.  Sinus bradycardia on ECG Asymptomatic and normotensive.  No syncopal episodes.  Needs evaluation by cardiologist.  Raihan was seen today for follow-up.  Diagnoses and all orders for this visit:  Sinus bradycardia on ECG -     Ambulatory referral to Cardiology -     Comprehensive metabolic panel -     CBC with Differential/Platelet -     TSH -     Lipid panel -     Magnesium  Dyslipidemia  Alzheimer's disease ([place])  Essential hypertension  Patient Instructions  Bradicardia, en adultos Bradycardia, Adult La bradicardia es una frecuencia cardaca ms lenta que lo normal. El corazn late [employer] 60 y 100 veces por minuto en un adulto, en reposo. Si presenta bradicardia, la frecuencia cardaca en reposo es inferior a 60 latidospor minuto. La bradicardia puede impedir que llegue la cantidad suficiente de oxgeno a ciertas zonas del cuerpo  cuando est realizando [place]. Puede ser grave si impide que llegue la suficiente cantidad de oxgeno al cerebro y a otras partes del cuerpo. La bradicardia no representa un problema para todas las personas. Para algunos adultos sanos, una frecuencia cardaca lenta enreposo es normal. Cules son las causas? Esta afeccin puede ser causada por lo siguiente: Problemas con el corazn, estos incluyen: Un problema con el sistema elctrico del corazn, por ejemplo, un bloqueo cardaco. Con un bloqueo cardaco, las seales elctricas [employer] cavidades del corazn estn parcial o completamente cerradas, de modo que no pueden funcionar como deberan. Un problema con el marcapasos natural del corazn (ndulo sinusal). Enfermedad cardaca. Infarto de miocardio. Dao cardaco. Enfermedad de [place]. Infeccin en el corazn. Afeccin cardaca que est presente al nacer (defecto cardaco congnito). Determinados medicamentos para el tratamiento de afecciones cardacas. Determinadas afecciones, como hipotiroidismo y apnea obstructiva del sueo. Problemas con el equilibrio de sustancias qumicas y otras sustancias en la sangre, como el [place]. Traumatismos. Radioterapia. Qu incrementa el riesgo? Es ms probable que tenga esta afeccin si: Es mayor de 42 aos de edad. Tiene presin arterial alta (hipertensin), colesterol alto (hiperlipidemia) o diabetes. Toma mucho alcohol, consume productos con tabaco o nicotina o [place] drogas. Cules son los signos o los sntomas? Los sntomas de esta afeccin incluyen: Desvanecimiento. Desmayo o sensacin de desvanecimiento. Fatiga y debilidad. Dificultad para realizar Scientist, forensic ejercicio. Falta de aire. Dolor en el pecho (angina). Somnolencia. Confusin. Mareos. Cmo se diagnostica? Esta afeccin se puede diagnosticar en funcin de lo siguiente: Sus sntomas. Sus antecedentes mdicos. Un examen fsico. Durante el examen, el mdico  auscultar la frecuencia cardaca y controlar el pulso. El mdico tambin puede indicarle estudios para confirmar el diagnstico, como los siguientes: Anlisis de [place]. Electrocardiograma (ECG). El electrocardiograma registra la actividad elctrica del corazn. El estudio puede mostrar el ritmo del latido cardaco y si la frecuencia cardaca se mantiene. Un estudio en el que se utiliza un dispositivo porttil (grabadora de eventos) o Holter para registrar la actividad elctrica del corazn mientras realiza sus actividades cotidianas.  Una prueba de ejercicio. Cmo se trata? El tratamiento de esta afeccin depende de la intensidad de los sntomas y de la causa de Engineer, manufacturing systems. El tratamiento puede implicar lo siguiente: Tratamiento de la afeccin preexistente. Cambiar los medicamentos o la dosis de [place]. Implantar debajo de la piel un dispositivo a batera pequeo llamado marcapasos. Cuando se produce un episodio de bradicardia, este dispositivo puede utilizarse para aumentar la frecuencia cardaca y ayudar a que su corazn lata a un ritmo constante. Siga estas indicaciones en su casa: Estilo de [place] indicaciones del mdico para controlar cualquier enfermedad que contribuya a la bradicardia. Siga una dieta cardiosaludable. Un especialista en nutricin (nutricionista) puede darle instrucciones sobre opciones y variantes de alimentos saludables. Siga un programa de actividad fsica aprobado por el mdico. Mantenga un peso saludable. Trate de reducir o Advice worker de estrs con actividades como el yoga o la meditacin. Si necesita ayuda para reducir [employer] de estrs, consulte al mdico. No consuma ningn producto que contenga nicotina o tabaco, como cigarrillos, cigarrillos electrnicos y tabaco de Higher education careers adviser. Si necesita ayuda para dejar de consumir, consulte al mdico. No consuma drogas ilegales. Limite el consumo de alcohol a no ms de 1 medida por da si es mujer y no est  Music therapist, y 2 medidas por da si es hombre. Est atento a la cantidad de alcohol que hay en las bebidas que toma. En los [place], una medida equivale a una botella de cerveza de 12 oz (355 ml), un vaso de vino de 5 oz (148 ml) o un vaso de una bebida alcohlica de alta graduacin de 1 oz (44 ml).  Indicaciones generales [employer] de venta libre y los recetados solamente como se lo haya indicado el mdico. Consulting civil engineer a todas las visitas de control como se lo haya indicado el mdico. Esto es importante. Cmo se evita? En algunos casos, la bradicardia se puede evitar a travs de: El tratamiento de problemas mdicos preexistentes. Evitar las conductas o medicamentos que pueden Geographical information systems officer. Comunquese con un mdico si: Se siente mareado o siente que va a desvanecerse. Casi se desmaya. Se siente dbil o se fatiga con facilidad durante la actividad fsica. Siente confusin o tiene problemas de [place]. Solicite ayuda inmediatamente si: Se desmaya. Tiene los siguientes sntomas: Latidos cardacos irregulares (palpitaciones). Dolor en el pecho. Dificultad para respirar. Resumen La bradicardia es una frecuencia cardaca ms lenta que lo normal. Si presenta bradicardia, la frecuencia cardaca en reposo es inferior a 60 latidos por minuto. El tratamiento de esta afeccin depende de la causa. Siga las indicaciones del mdico para controlar cualquier enfermedad que contribuya a la bradicardia. No consuma ningn producto que contenga nicotina o tabaco, como cigarrillos, cigarrillos electrnicos y tabaco de Higher education careers adviser, y limite el consumo de alcohol. Concurra a todas las visitas de control como se lo haya indicado el mdico. Esto es importante. Esta informacin no tiene Marine scientist el consejo del mdico. Asegresede hacerle al mdico cualquier pregunta que tenga. Document Revised: 09/27/2017 Document Reviewed: 09/27/2017 Elsevier Patient Education  2022 [place], MD [facility]

## 2020-08-29 NOTE — Assessment & Plan Note (Signed)
Diet and nutrition discussed. Continue rosuvastatin 10 mg daily.  

## 2020-08-29 NOTE — Assessment & Plan Note (Signed)
Well-controlled hypertension. Continue lisinopril 10 mg daily 

## 2020-08-30 NOTE — Progress Notes (Signed)
Cardiology Office Note   Date:  09/04/2020   ID:  Floyd, Eric 1945-03-21, MRN [id number]  PCP:  Horald Pollen, MD  Cardiologist:   Maame Dack [place], MD   Chief Complaint  Patient presents with   Bradycardia      History of Present Illness: Eric Floyd is a 75 y.o. male who is seen at the request of [name] for evaluation of bradycardia. He has a history of HLD and Alzheimer's disease. When seen for Neuro follow up in June was noted to have a HR of 40. Ecg reportedly showed sinus brady. Seen 10 days later by primary care and HR recorded as 39. Actually looking at his recorded vital signs dating back to Dec 2020 he has been bradycardic with HR typically in the 40s. He has no symptoms of dizziness or syncope. His memory is poor. He is on no rate slowing medication. He is sedentary. Denies Chest pain or SOB. Family is more concerned about weight loss. He has lost 6 lbs this year.    Past Medical History:  Diagnosis Date   Hyperlipidemia    Memory difficulty 02/19/2020    Past Surgical History:  Procedure Laterality Date   NO PAST SURGERIES       Current Outpatient Medications  Medication Sig Dispense Refill   Ascorbic Acid (VITAMIN C) 100 MG tablet Take 500 mg by mouth daily.     Calcium Carb-Cholecalciferol (CALCIUM 600 + D PO) Take by mouth.     cetirizine (ZYRTEC) 10 MG tablet Take 10 mg by mouth daily.     Cyanocobalamin (VITAMIN B-12 PO) Take by mouth.     lisinopril (ZESTRIL) 10 MG tablet Take 1 tablet (10 mg total) by mouth daily. 90 tablet 3   memantine (NAMENDA) 10 MG tablet Take 1 tablet (10 mg total) by mouth 2 (two) times daily. 60 tablet 5   Multiple Vitamin (MULTIVITAMIN) tablet Take 1 tablet by mouth daily.     OVER THE COUNTER MEDICATION      OVER THE COUNTER MEDICATION      rosuvastatin (CRESTOR) 10 MG tablet Take 1 tablet (10 mg total) by mouth daily. 90 tablet 3   Vitamin D, Ergocalciferol, (DRISDOL) 1.25 MG (50000 UNIT) CAPS  capsule TAKE 1 CAPSULE EVERY 7 DAYS 7 capsule 0   No current facility-administered medications for this visit.    Allergies:   Patient has no known allergies.    Social History:  The patient  reports that he has never smoked. He has never used smokeless tobacco. He reports current alcohol use of about 21.0 standard drinks of alcohol per week. He reports that he does not use drugs.   Family History:  The patient's family history includes Heart attack in his brother; Heart disease in his brother; Ovarian cancer in his niece; Stroke in his mother.    ROS:  Please see the history of present illness.   Otherwise, review of systems are positive for none.   All other systems are reviewed and negative.    PHYSICAL EXAM: VS:  BP 116/60   Pulse (!) 47   Ht 5\' 6"  (1.676 m)   Wt 146 lb 6.4 oz (66.4 kg)   SpO2 96%   BMI 23.63 kg/m  , BMI Body mass index is 23.63 kg/m. GEN: Well nourished, well developed, in no acute distress HEENT: normal Neck: no JVD, carotid bruits, or masses Cardiac: RRR; soft 2-4/2 systolic murmur at the apex. No  rubs,  or gallops,no edema  Respiratory:  clear to auscultation bilaterally, normal work of breathing GI: soft, nontender, nondistended, + BS MS: no deformity or atrophy Skin: warm and dry, no rash Neuro:  Strength and sensation are intact Psych: euthymic mood, full affect   EKG:  EKG is ordered today. The ekg ordered today demonstrates sinus brady rate 47. LAD. I have personally reviewed and interpreted this study.    Recent Labs: 08/29/2020: ALT 15; BUN 21; Creatinine, Ser 0.98; Hemoglobin 14.4; Magnesium 2.2; Platelets 211.0; Potassium 4.5; Sodium 136; TSH 1.72    Lipid Panel    Component Value Date/Time   CHOL 141 08/29/2020 1208   CHOL 257 (H) 12/20/2019 1159   TRIG 128.0 08/29/2020 1208   HDL 46.90 08/29/2020 1208   HDL 50 12/20/2019 1159   CHOLHDL 3 08/29/2020 1208   VLDL 25.6 08/29/2020 1208   LDLCALC 69 08/29/2020 1208   LDLCALC 171  (H) 12/20/2019 1159      Wt Readings from Last 3 Encounters:  09/04/20 146 lb 6.4 oz (66.4 kg)  08/29/20 147 lb (66.7 kg)  08/19/20 145 lb 9.6 oz (66 kg)      Other studies Reviewed: Additional studies/ records that were reviewed today include: none. Review of the above records demonstrates: N/A   ASSESSMENT AND PLAN:  1.  Sinus brady. Chronic and asymptomatic. On no rate slowing medication. No other conduction system disease noted. Labs are OK. No indication for pacemaker. If he were to develop syncope or significant dizziness would need reevaluation but for now no further work up needed. 2. Alzheimer's disease.    Current medicines are reviewed at length with the patient today.  The patient does not have concerns regarding medicines.  The following changes have been made:  no change  Labs/ tests ordered today include:  No orders of the defined types were placed in this encounter.    Disposition:   FU with me PRN   Signed, Selen Smucker [place], MD  09/04/2020 10:49 AM    [facility] [address] [place], [place], [address] Phone [phone], Fax [phone]

## 2020-09-04 ENCOUNTER — Telehealth: Payer: Self-pay

## 2020-09-04 ENCOUNTER — Ambulatory Visit: Payer: PPO | Admitting: Cardiology

## 2020-09-04 ENCOUNTER — Other Ambulatory Visit: Payer: Self-pay

## 2020-09-04 ENCOUNTER — Encounter: Payer: Self-pay | Admitting: Cardiology

## 2020-09-04 VITALS — BP 116/60 | HR 47 | Ht 66.0 in | Wt 146.4 lb

## 2020-09-04 DIAGNOSIS — R001 Bradycardia, unspecified: Secondary | ICD-10-CM

## 2020-09-04 NOTE — Telephone Encounter (Signed)
EkG ordered.

## 2021-02-18 NOTE — Progress Notes (Deleted)
PATIENT: Eric Floyd DOB: [birth year]  REASON FOR VISIT: follow up HISTORY FROM: patient Primary Neurologist: Dr. Jannifer Franklin, will now be followed by Dr. Rexene Alberts  HISTORY OF PRESENT ILLNESS: Today 02/18/21 Eric Floyd here today for follow-up for Alzheimer's Disease. Last visit HR was 40, Aricept was stopped, was sent to cardiology. Determined no need for pacemaker, was not symptomatic.    Update 08/19/2020 SS: Eric Floyd is a 75 year old male with history of memory disturbance.  MRI of the brain showed generalized atrophy consistent with Alzheimer's disease, mild white matter changes. MMSE 16/30. Has trouble remembering where to put things, forgets what his wife send him for in the house. Not driving anymore. Wife manages his finances. Spends his day reading, watching TV. Takes naps during day, sometimes trouble sleeping at night if naps during day. At times gets upset at his wife, frustrated when she corrects him. He has 3 mixed drinks daily, more on the weekend. Has fallen out of bed twice. Lost 8 lbs since last seen. HR is noted to be 40 today, is asymptomatic.  HISTORY 02/19/2020 Dr. Jannifer Franklin: Eric Floyd is a 57 year old right-handed Hispanic male with a 1 year history of changes in memory.  He comes in with his wife today who indicates that she has noted some significant changes in memory for least a year or year and a half prior to this visit.  The wife noted that he was having some increasing problems with driving, he was starting to get lost, she has not allowed him to drive over the last year.  She now has taken over the finances and is managing his medications over the last year, she also keeps up with the appointments.  The patient has short-term memory issues, he does have some problems with remembering names.  His older sister apparently also has memory problems.  The patient denies any headaches or dizziness, he denies numbness or weakness of the extremities or difficulty with balance  or difficulty controlling the bowels or the bladder.  He is sleeping fairly well, but he has a tendency to nap during the day and stay awake and watch TV at night.  He is sent to this office for further evaluation.  He has had recent blood work done that shows a normal B12 level.  REVIEW OF SYSTEMS: Out of a complete 14 system review of symptoms, the patient complains only of the following symptoms, and all other reviewed systems are negative.   See HPI  ALLERGIES: No Known Allergies  HOME MEDICATIONS: Outpatient Medications Prior to Visit  Medication Sig Dispense Refill   Ascorbic Acid (VITAMIN C) 100 MG tablet Take 500 mg by mouth daily.     Calcium Carb-Cholecalciferol (CALCIUM 600 + D PO) Take by mouth.     cetirizine (ZYRTEC) 10 MG tablet Take 10 mg by mouth daily.     Cyanocobalamin (VITAMIN B-12 PO) Take by mouth.     lisinopril (ZESTRIL) 10 MG tablet Take 1 tablet (10 mg total) by mouth daily. 90 tablet 3   memantine (NAMENDA) 10 MG tablet Take 1 tablet (10 mg total) by mouth 2 (two) times daily. 60 tablet 5   Multiple Vitamin (MULTIVITAMIN) tablet Take 1 tablet by mouth daily.     OVER THE COUNTER MEDICATION      OVER THE COUNTER MEDICATION      rosuvastatin (CRESTOR) 10 MG tablet Take 1 tablet (10 mg total) by mouth daily. 90 tablet 3   Vitamin D, Ergocalciferol, (DRISDOL) 1.25  MG (50000 UNIT) CAPS capsule TAKE 1 CAPSULE EVERY 7 DAYS 7 capsule 0   No facility-administered medications prior to visit.    PAST MEDICAL HISTORY: Past Medical History:  Diagnosis Date   Hyperlipidemia    Memory difficulty 02/19/2020    PAST SURGICAL HISTORY: Past Surgical History:  Procedure Laterality Date   NO PAST SURGERIES      FAMILY HISTORY: Family History  Problem Relation Age of Onset   Stroke Mother    Heart disease Brother    Heart attack Brother    Ovarian cancer Niece    Colon cancer Neg Hx    Colon polyps Neg Hx    Esophageal cancer Neg Hx    Rectal cancer Neg Hx     Stomach cancer Neg Hx     SOCIAL HISTORY: Social History   Socioeconomic History   Marital status: Married    Spouse name: Not on file   Number of children: Not on file   Years of education: Not on file   Highest education level: Not on file  Occupational History   Not on file  Tobacco Use   Smoking status: Never   Smokeless tobacco: Never  Vaping Use   Vaping Use: Never used  Substance and Sexual Activity   Alcohol use: Yes    Alcohol/week: 21.0 standard drinks    Types: 21 Shots of liquor per week    Comment: 3 drinks a day per pt   Drug use: Never   Sexual activity: Not on file  Other Topics Concern   Not on file  Social History Narrative   Lives with wife   Right handed   Drinks 1-2 cups caffeine daily   Social Determinants of Health   Financial Resource Strain: Not on file  Food Insecurity: Not on file  Transportation Needs: Not on file  Physical Activity: Not on file  Stress: Not on file  Social Connections: Not on file  Intimate Partner Violence: Not on file   PHYSICAL EXAM  There were no vitals filed for this visit.  There is no height or weight on file to calculate BMI.  Generalized: Well developed, in no acute distress  MMSE - Mini Mental State Exam 08/19/2020 02/19/2020  Orientation to time 1 1  Orientation to Place 3 3  Registration 3 3  Attention/ Calculation 1 3  Recall 0 0  Language- name 2 objects 2 2  Language- repeat 0 0  Language- repeat-comments UTO- language barrier -  Language- follow 3 step command 3 3  Language- read & follow direction 1 1  Write a sentence 1 1  Copy design 1 1  Total score 16 18    Neurological examination  Mentation: Alert, cooperative, history is mostly provided by his wife. Follows all commands speech and language fluent Cranial nerve II-XII: Pupils were equal round reactive to light. Extraocular movements were full, visual field were full on confrontational test. Facial sensation and strength were normal.  Head turning and shoulder shrug  were normal and symmetric. Motor: The motor testing reveals 5 over 5 strength of all 4 extremities. Good symmetric motor tone is noted throughout.  Sensory: Sensory testing is intact to soft touch on all 4 extremities. No evidence of extinction is noted.  Coordination: Cerebellar testing reveals good finger-nose-finger and heel-to-shin bilaterally.  Gait and station: Gait is normal.  Reflexes: Deep tendon reflexes are symmetric and normal bilaterally.   DIAGNOSTIC DATA (LABS, IMAGING, TESTING) - I reviewed patient records, labs, notes,  testing and imaging myself where available.  Lab Results  Component Value Date   WBC 3.2 (L) 08/29/2020   HGB 14.4 08/29/2020   HCT 41.8 08/29/2020   MCV 93.0 08/29/2020   PLT 211.0 08/29/2020      Component Value Date/Time   NA 136 08/29/2020 1208   NA 140 12/20/2019 1159   K 4.5 08/29/2020 1208   CL 99 08/29/2020 1208   CO2 31 08/29/2020 1208   GLUCOSE 86 08/29/2020 1208   BUN 21 08/29/2020 1208   BUN 15 12/20/2019 1159   CREATININE 0.98 08/29/2020 1208   CALCIUM 10.0 08/29/2020 1208   PROT 7.8 08/29/2020 1208   PROT 7.7 12/20/2019 1159   ALBUMIN 4.4 08/29/2020 1208   ALBUMIN 4.5 12/20/2019 1159   AST 22 08/29/2020 1208   ALT 15 08/29/2020 1208   ALKPHOS 45 08/29/2020 1208   BILITOT 0.4 08/29/2020 1208   BILITOT 0.5 12/20/2019 1159   GFRNONAA 81 12/20/2019 1159   GFRAA 94 12/20/2019 1159   Lab Results  Component Value Date   CHOL 141 08/29/2020   HDL 46.90 08/29/2020   LDLCALC 69 08/29/2020   TRIG 128.0 08/29/2020   CHOLHDL 3 08/29/2020   Lab Results  Component Value Date   HGBA1C 5.5 12/20/2019   Lab Results  Component Value Date   VITAMINB12 1,337 (H) 12/20/2019   Lab Results  Component Value Date   TSH 1.72 08/29/2020   ASSESSMENT AND PLAN 75 y.o. year old male  has a past medical history of Hyperlipidemia and Memory difficulty (02/19/2020). here with:  1.  Alzheimer's disease, MMSE  16/30 2.  Bradycardia  -Stop Aricept, given heart rate 40, EKG tracing here looks like sinus bradycardia, bradycardia could be related to Aricept, he is asymptomatic -Will send a note to his PCP, to follow-up on bradycardia, improvement off Aricept? -Can still start Namenda, but will wait 2 weeks before initiating before adding any new medication -Recommend he decrease his alcohol consumption, as this is when he can be agitated, he seems okay with this -MRI of the brain has showed generalized atrophy consistent with Alzheimer's disease -Follow-up here in 6 months or sooner if needed  I spent 35 minutes of face-to-face and non-face-to-face time with patient.  This included previsit chart review, lab review, study review, order entry, discussing memory score, HR, medications, management, and follow-up.  Butler Denmark, AGNP-C, DNP 02/18/2021, 4:15 PM [facility] [address], [place],  [address] [phone]

## 2021-02-19 ENCOUNTER — Ambulatory Visit: Payer: PPO | Admitting: Neurology

## 2021-02-19 ENCOUNTER — Other Ambulatory Visit: Payer: Self-pay

## 2021-02-25 ENCOUNTER — Other Ambulatory Visit: Payer: Self-pay

## 2021-02-25 ENCOUNTER — Ambulatory Visit: Payer: PPO | Admitting: Neurology

## 2021-02-25 ENCOUNTER — Encounter: Payer: Self-pay | Admitting: Neurology

## 2021-02-25 VITALS — BP 136/68 | HR 46 | Ht 66.0 in | Wt 151.0 lb

## 2021-02-25 DIAGNOSIS — F0280 Dementia in other diseases classified elsewhere without behavioral disturbance: Secondary | ICD-10-CM | POA: Diagnosis not present

## 2021-02-25 DIAGNOSIS — R001 Bradycardia, unspecified: Secondary | ICD-10-CM

## 2021-02-25 DIAGNOSIS — G309 Alzheimer's disease, unspecified: Secondary | ICD-10-CM

## 2021-02-25 MED ORDER — MEMANTINE HCL 10 MG PO TABS: 10.0000 mg | ORAL_TABLET | Freq: Two times a day (BID) | ORAL | 1 refills | Status: DC

## 2021-02-25 NOTE — Progress Notes (Addendum)
PATIENT: Eric Floyd DOB: 1945-06-10  REASON FOR VISIT: follow up for memory HISTORY FROM: patient and wife  Primary Neurologist: Dr. Jannifer Franklin, will now be followed by Dr. Rexene Alberts  HISTORY OF PRESENT ILLNESS: Today 02/25/21 Eric Floyd here today for follow-up for Alzheimer's Disease. Last visit HR was 40, Aricept was stopped, was sent to cardiology. Determined no need for pacemaker, was not symptomatic. MMSE 14/30 today. Misplaces things, looks at old pictures, can't remember the names. He has calmed down greatly, due to cutting back on alcohol. Likes to watch TV, is rather sedentary. Has increased appetite. Enjoyed watching the world cup. Sleeping well, does get up 2-3 times to urinate. He doesn't drive.  Wife does medications, has been taking Namenda 10 mg 1/2 tablet twice daily. Does own ADLs. They went to [place] in August.  Update 08/19/2020 SS: Eric Floyd is a 75 year old male with history of memory disturbance.  MRI of the brain showed generalized atrophy consistent with Alzheimer's disease, mild white matter changes. MMSE 16/30. Has trouble remembering where to put things, forgets what his wife send him for in the house. Not driving anymore. Wife manages his finances. Spends his day reading, watching TV. Takes naps during day, sometimes trouble sleeping at night if naps during day. At times gets upset at his wife, frustrated when she corrects him. He has 3 mixed drinks daily, more on the weekend. Has fallen out of bed twice. Lost 8 lbs since last seen. HR is noted to be 40 today, is asymptomatic.  HISTORY 02/19/2020 Dr. Jannifer Franklin: Eric Floyd is a 47 year old right-handed Hispanic male with a 1 year history of changes in memory.  He comes in with his wife today who indicates that she has noted some significant changes in memory for least a year or year and a half prior to this visit.  The wife noted that he was having some increasing problems with driving, he was starting to get lost, she has  not allowed him to drive over the last year.  She now has taken over the finances and is managing his medications over the last year, she also keeps up with the appointments.  The patient has short-term memory issues, he does have some problems with remembering names.  His older sister apparently also has memory problems.  The patient denies any headaches or dizziness, he denies numbness or weakness of the extremities or difficulty with balance or difficulty controlling the bowels or the bladder.  He is sleeping fairly well, but he has a tendency to nap during the day and stay awake and watch TV at night.  He is sent to this office for further evaluation.  He has had recent blood work done that shows a normal B12 level.  REVIEW OF SYSTEMS: Out of a complete 14 system review of symptoms, the patient complains only of the following symptoms, and all other reviewed systems are negative.   See HPI  ALLERGIES: No Known Allergies  HOME MEDICATIONS: Outpatient Medications Prior to Visit  Medication Sig Dispense Refill   Ascorbic Acid (VITAMIN C) 100 MG tablet Take 500 mg by mouth daily.     Calcium Carb-Cholecalciferol (CALCIUM 600 + D PO) Take by mouth.     cetirizine (ZYRTEC) 10 MG tablet Take 10 mg by mouth daily.     Cyanocobalamin (VITAMIN B-12 PO) Take by mouth.     lisinopril (ZESTRIL) 10 MG tablet Take 1 tablet (10 mg total) by mouth daily. 90 tablet 3   memantine (  NAMENDA) 10 MG tablet Take 1 tablet (10 mg total) by mouth 2 (two) times daily. 60 tablet 5   Multiple Vitamin (MULTIVITAMIN) tablet Take 1 tablet by mouth daily.     Omega-3 Fatty Acids (FISH OIL OMEGA-3 PO) Take by mouth.     OVER THE COUNTER MEDICATION      OVER THE COUNTER MEDICATION      rosuvastatin (CRESTOR) 10 MG tablet Take 1 tablet (10 mg total) by mouth daily. 90 tablet 3   Vitamin D, Ergocalciferol, (DRISDOL) 1.25 MG (50000 UNIT) CAPS capsule TAKE 1 CAPSULE EVERY 7 DAYS 7 capsule 0   No facility-administered  medications prior to visit.    PAST MEDICAL HISTORY: Past Medical History:  Diagnosis Date   Hyperlipidemia    Memory difficulty 02/19/2020    PAST SURGICAL HISTORY: Past Surgical History:  Procedure Laterality Date   NO PAST SURGERIES      FAMILY HISTORY: Family History  Problem Relation Age of Onset   Stroke Mother    Heart disease Brother    Heart attack Brother    Ovarian cancer Niece    Colon cancer Neg Hx    Colon polyps Neg Hx    Esophageal cancer Neg Hx    Rectal cancer Neg Hx    Stomach cancer Neg Hx     SOCIAL HISTORY: Social History   Socioeconomic History   Marital status: Married    Spouse name: Not on file   Number of children: Not on file   Years of education: Not on file   Highest education level: Not on file  Occupational History   Not on file  Tobacco Use   Smoking status: Never   Smokeless tobacco: Never  Vaping Use   Vaping Use: Never used  Substance and Sexual Activity   Alcohol use: Yes    Alcohol/week: 21.0 standard drinks    Types: 21 Shots of liquor per week    Comment: 3 drinks a day per pt   Drug use: Never   Sexual activity: Not on file  Other Topics Concern   Not on file  Social History Narrative   Lives with wife   Right handed   Drinks 1-2 cups caffeine daily   Social Determinants of Health   Financial Resource Strain: Not on file  Food Insecurity: Not on file  Transportation Needs: Not on file  Physical Activity: Not on file  Stress: Not on file  Social Connections: Not on file  Intimate Partner Violence: Not on file   PHYSICAL EXAM  Vitals:   02/25/21 1041  BP: 136/68  Pulse: (!) 46  Weight: 151 lb (68.5 kg)  Height: 5\' 6"  (1.676 m)    Body mass index is 24.37 kg/m.  Generalized: Well developed, in no acute distress  MMSE - Mini Mental State Exam 02/25/2021 08/19/2020 02/19/2020  Orientation to time 1 1 1   Orientation to Place 3 3 3   Registration 3 3 3   Attention/ Calculation 1 1 3   Recall 0 0 0   Language- name 2 objects 2 2 2   Language- repeat 0 0 0  Language- repeat-comments unable to do. UTO- language barrier -  Language- follow 3 step command 1 3 3   Language- read & follow direction 1 1 1   Write a sentence 1 1 1   Copy design 1 1 1   Total score 14 16 18     Neurological examination  Mentation: Alert, cooperative, smiling, history is mostly provided by his wife. Follows all commands  speech and language fluent Cranial nerve II-XII: Pupils were equal round reactive to light. Extraocular movements were full, visual field were full on confrontational test. Facial sensation and strength were normal. Head turning and shoulder shrug  were normal and symmetric. Motor: The motor testing reveals 5 over 5 strength of all 4 extremities. Good symmetric motor tone is noted throughout.  Sensory: Sensory testing is intact to soft touch on all 4 extremities. No evidence of extinction is noted.  Coordination: Cerebellar testing reveals good finger-nose-finger and heel-to-shin bilaterally.  Gait and station: Gait is normal. Normal turns and arm swings. Reflexes: Deep tendon reflexes are symmetric and normal bilaterally.   DIAGNOSTIC DATA (LABS, IMAGING, TESTING) - I reviewed patient records, labs, notes, testing and imaging myself where available.  Lab Results  Component Value Date   WBC 3.2 (L) 08/29/2020   HGB 14.4 08/29/2020   HCT 41.8 08/29/2020   MCV 93.0 08/29/2020   PLT 211.0 08/29/2020      Component Value Date/Time   NA 136 08/29/2020 1208   NA 140 12/20/2019 1159   K 4.5 08/29/2020 1208   CL 99 08/29/2020 1208   CO2 31 08/29/2020 1208   GLUCOSE 86 08/29/2020 1208   BUN 21 08/29/2020 1208   BUN 15 12/20/2019 1159   CREATININE 0.98 08/29/2020 1208   CALCIUM 10.0 08/29/2020 1208   PROT 7.8 08/29/2020 1208   PROT 7.7 12/20/2019 1159   ALBUMIN 4.4 08/29/2020 1208   ALBUMIN 4.5 12/20/2019 1159   AST 22 08/29/2020 1208   ALT 15 08/29/2020 1208   ALKPHOS 45 08/29/2020 1208    BILITOT 0.4 08/29/2020 1208   BILITOT 0.5 12/20/2019 1159   GFRNONAA 81 12/20/2019 1159   GFRAA 94 12/20/2019 1159   Lab Results  Component Value Date   CHOL 141 08/29/2020   HDL 46.90 08/29/2020   LDLCALC 69 08/29/2020   TRIG 128.0 08/29/2020   CHOLHDL 3 08/29/2020   Lab Results  Component Value Date   HGBA1C 5.5 12/20/2019   Lab Results  Component Value Date   VITAMINB12 1,337 (H) 12/20/2019   Lab Results  Component Value Date   TSH 1.72 08/29/2020   ASSESSMENT AND PLAN 75 y.o. year old male  has a past medical history of Hyperlipidemia and Memory difficulty (02/19/2020). here with:  1.  Alzheimer's disease, MMSE 14/30 2.  Bradycardia  -MMSE stable 14/30 (was 16/30 last visit), is doing much better, since cutting back on alcohol consumption in regards to him mood and memory  -Will increase Namenda 10 mg twice daily, has currently been taking 5 mg twice daily, will watch out for dizziness -Had cardiology evaluation for bradycardia, determined no intervention was needed since asymptomatic, will continue to hold Aricept given the potential to lower the heart rate, also wife isn't sure it helped any -MRI of the brain has showed generalized atrophy consistent with Alzheimer's disease -Follow-up here in 6 months or sooner if needed, will be followed by Dr. Rexene Alberts since Dr. Jannifer Franklin has retired, if he is stable at next visit may return here PRN basis  Butler Denmark, AGNP-C, DNP 02/25/2021, 11:06 AM [facility] [address], [place], [place] [address] [phone]  I reviewed the above note and documentation by the Nurse Practitioner and agree with the history, exam, assessment and plan as outlined above. I was available for consultation. Star Age, MD, PhD [facility])

## 2021-02-25 NOTE — Patient Instructions (Signed)
Increase Namenda to 1 full tablet twice daily for the memory, watch out for dizziness  Try to increase brain stimulating exercises See you back in 6 months

## 2021-04-14 ENCOUNTER — Telehealth: Payer: Self-pay | Admitting: Emergency Medicine

## 2021-04-14 NOTE — Telephone Encounter (Signed)
LVM for pt to rtn my call to schedule AWV with [facility]. Please schedule this appt if pt call the office.

## 2021-06-25 ENCOUNTER — Ambulatory Visit (INDEPENDENT_AMBULATORY_CARE_PROVIDER_SITE_OTHER): Payer: PPO | Admitting: Emergency Medicine

## 2021-06-25 ENCOUNTER — Encounter: Payer: Self-pay | Admitting: Emergency Medicine

## 2021-06-25 VITALS — BP 110/68 | HR 47 | Temp 98.0°F | Ht 66.0 in | Wt 150.4 lb

## 2021-06-25 DIAGNOSIS — F0280 Dementia in other diseases classified elsewhere without behavioral disturbance: Secondary | ICD-10-CM

## 2021-06-25 DIAGNOSIS — I10 Essential (primary) hypertension: Secondary | ICD-10-CM

## 2021-06-25 DIAGNOSIS — Z130 Encounter for screening for diseases of the blood and blood-forming organs and certain disorders involving the immune mechanism: Secondary | ICD-10-CM

## 2021-06-25 DIAGNOSIS — Z0001 Encounter for general adult medical examination with abnormal findings: Secondary | ICD-10-CM

## 2021-06-25 DIAGNOSIS — Z13228 Encounter for screening for other metabolic disorders: Secondary | ICD-10-CM | POA: Diagnosis not present

## 2021-06-25 DIAGNOSIS — J3489 Other specified disorders of nose and nasal sinuses: Secondary | ICD-10-CM | POA: Diagnosis not present

## 2021-06-25 DIAGNOSIS — E785 Hyperlipidemia, unspecified: Secondary | ICD-10-CM

## 2021-06-25 DIAGNOSIS — G309 Alzheimer's disease, unspecified: Secondary | ICD-10-CM

## 2021-06-25 DIAGNOSIS — Z1159 Encounter for screening for other viral diseases: Secondary | ICD-10-CM | POA: Diagnosis not present

## 2021-06-25 DIAGNOSIS — Z13220 Encounter for screening for lipoid disorders: Secondary | ICD-10-CM | POA: Diagnosis not present

## 2021-06-25 DIAGNOSIS — R001 Bradycardia, unspecified: Secondary | ICD-10-CM | POA: Diagnosis not present

## 2021-06-25 DIAGNOSIS — Z1329 Encounter for screening for other suspected endocrine disorder: Secondary | ICD-10-CM | POA: Diagnosis not present

## 2021-06-25 LAB — CBC WITH DIFFERENTIAL/PLATELET
Basophils Absolute: 0 10*3/uL (ref 0.0–0.1)
Basophils Relative: 0.7 % (ref 0.0–3.0)
Eosinophils Absolute: 0.2 10*3/uL (ref 0.0–0.7)
Eosinophils Relative: 5.5 % — ABNORMAL HIGH (ref 0.0–5.0)
HCT: 45.4 % (ref 39.0–52.0)
Hemoglobin: 15.1 g/dL (ref 13.0–17.0)
Lymphocytes Relative: 38.8 % (ref 12.0–46.0)
Lymphs Abs: 1.3 10*3/uL (ref 0.7–4.0)
MCHC: 33.2 g/dL (ref 30.0–36.0)
MCV: 94.4 fl (ref 78.0–100.0)
Monocytes Absolute: 0.3 10*3/uL (ref 0.1–1.0)
Monocytes Relative: 8.3 % (ref 3.0–12.0)
Neutro Abs: 1.5 10*3/uL (ref 1.4–7.7)
Neutrophils Relative %: 46.7 % (ref 43.0–77.0)
Platelets: 212 10*3/uL (ref 150.0–400.0)
RBC: 4.81 Mil/uL (ref 4.22–5.81)
RDW: 13.1 % (ref 11.5–15.5)
WBC: 3.3 10*3/uL — ABNORMAL LOW (ref 4.0–10.5)

## 2021-06-25 LAB — COMPREHENSIVE METABOLIC PANEL
ALT: 20 U/L (ref 0–53)
AST: 25 U/L (ref 0–37)
Albumin: 4.4 g/dL (ref 3.5–5.2)
Alkaline Phosphatase: 55 U/L (ref 39–117)
BUN: 22 mg/dL (ref 6–23)
CO2: 32 mEq/L (ref 19–32)
Calcium: 9.5 mg/dL (ref 8.4–10.5)
Chloride: 100 mEq/L (ref 96–112)
Creatinine, Ser: 1.01 mg/dL (ref 0.40–1.50)
GFR: 73.07 mL/min (ref >60.00)
Glucose, Bld: 85 mg/dL (ref 70–99)
Potassium: 4.4 mEq/L (ref 3.5–5.1)
Sodium: 138 mEq/L (ref 135–145)
Total Bilirubin: 0.5 mg/dL (ref 0.2–1.2)
Total Protein: 7.9 g/dL (ref 6.0–8.3)

## 2021-06-25 LAB — HEMOGLOBIN A1C: Hgb A1c MFr Bld: 5.6 % (ref 4.6–6.5)

## 2021-06-25 LAB — LIPID PANEL
Cholesterol: 151 mg/dL (ref 0–200)
HDL: 48.1 mg/dL (ref >39.00)
LDL Cholesterol: 72 mg/dL (ref 0–99)
NonHDL: 103.36
Total CHOL/HDL Ratio: 3
Triglycerides: 158 mg/dL — ABNORMAL HIGH (ref 0.0–149.0)
VLDL: 31.6 mg/dL (ref 0.0–40.0)

## 2021-06-25 MED ORDER — AZELASTINE HCL 0.1 % NA SOLN: 1.0000 | Freq: Two times a day (BID) | NASAL | 12 refills | Status: DC

## 2021-06-25 MED ORDER — ROSUVASTATIN CALCIUM 10 MG PO TABS: 10.0000 mg | ORAL_TABLET | Freq: Every day | ORAL | 3 refills | Status: DC

## 2021-06-25 MED ORDER — LISINOPRIL 10 MG PO TABS: 10.0000 mg | ORAL_TABLET | Freq: Every day | ORAL | 3 refills | Status: DC

## 2021-06-25 NOTE — Addendum Note (Signed)
Addended by: Rae Mar on: 06/25/2021 10:30 AM ? ? Modules accepted: Orders ? ?

## 2021-06-25 NOTE — Patient Instructions (Signed)
Mantenimiento de la salud en los hombres Health Maintenance, Male Adoptar un estilo de vida saludable y recibir atencin preventiva son importantes para promover la salud y el bienestar. Consulte al mdico sobre: El esquema adecuado para hacerse pruebas y exmenes peridicos. Cosas que puede hacer por su cuenta para prevenir enfermedades y mantenerse sano. Qu debo saber sobre la dieta, el peso y el ejercicio? Consuma una dieta saludable  Consuma una dieta que incluya muchas verduras, frutas, productos lcteos con bajo contenido de grasa y protenas magras. No consuma muchos alimentos ricos en grasas slidas, azcares agregados o sodio. Mantenga un peso saludable El ndice de masa muscular (IMC) es una medida que puede utilizarse para identificar posibles problemas de peso. Proporciona una estimacin de la grasa corporal basndose en el peso y la altura. Su mdico puede ayudarle a determinar su IMC y a lograr o mantener un peso saludable. Haga ejercicio con regularidad Haga ejercicio con regularidad. Esta es una de las prcticas ms importantes que puede hacer por su salud. La mayora de los adultos deben seguir estas pautas: Realizar, al menos, 150 minutos de actividad fsica por semana. El ejercicio debe aumentar la frecuencia cardaca y hacerlo transpirar (ejercicio de intensidad moderada). Hacer ejercicios de fortalecimiento por lo menos dos veces por semana. Agregue esto a su plan de ejercicio de intensidad moderada. Pase menos tiempo sentado. Incluso la actividad fsica ligera puede ser beneficiosa. Controle sus niveles de colesterol y lpidos en la sangre Comience a realizarse anlisis de lpidos y colesterol en la sangre a los 20 aos y luego reptalos cada 5 aos. Es posible que necesite controlar los niveles de colesterol con mayor frecuencia si: Sus niveles de lpidos y colesterol son altos. Es mayor de 40 aos. Presenta un alto riesgo de padecer enfermedades cardacas. Qu debo  saber sobre las pruebas de deteccin del cncer? Muchos tipos de cncer pueden detectarse de manera temprana y, a menudo, pueden prevenirse. Segn su historia clnica y sus antecedentes familiares, es posible que deba realizarse pruebas de deteccin del cncer en diferentes edades. Esto puede incluir pruebas de deteccin de lo siguiente: Cncer colorrectal. Cncer de prstata. Cncer de piel. Cncer de pulmn. Qu debo saber sobre la enfermedad cardaca, la diabetes y la hipertensin arterial? Presin arterial y enfermedad cardaca La hipertensin arterial causa enfermedades cardacas y aumenta el riesgo de accidente cerebrovascular. Es ms probable que esto se manifieste en las personas que tienen lecturas de presin arterial alta o tienen sobrepeso. Hable con el mdico sobre sus valores de presin arterial deseados. Hgase controlar la presin arterial: Cada 3 a 5 aos si tiene entre 18 y 39 aos. Todos los aos si es mayor de 40 aos. Si tiene entre 65 y 75 aos y es fumador o sola fumar, pregntele al mdico si debe realizarse una prueba de deteccin de aneurisma artico abdominal (AAA) por nica vez. Diabetes Realcese exmenes de deteccin de la diabetes con regularidad. Este anlisis revisa el nivel de azcar en la sangre en ayunas. Hgase las pruebas de deteccin: Cada tres aos despus de los 45 aos de edad si tiene un peso normal y un bajo riesgo de padecer diabetes. Con ms frecuencia y a partir de una edad inferior si tiene sobrepeso o un alto riesgo de padecer diabetes. Qu debo saber sobre la prevencin de infecciones? Hepatitis B Si tiene un riesgo ms alto de contraer hepatitis B, debe someterse a un examen de deteccin de este virus. Hable con el mdico para averiguar si tiene riesgo de   contraer la infeccin por hepatitis B. Hepatitis C Se recomienda un anlisis de sangre para: Todos los que nacieron entre 1945 y 1965. Todas las personas que tengan un riesgo de haber  contrado hepatitis C. Enfermedades de transmisin sexual (ETS) Debe realizarse pruebas de deteccin de ITS todos los aos, incluidas la gonorrea y la clamidia, si: Es sexualmente activo y es menor de 24 aos. Es mayor de 24 aos, y el mdico le informa que corre riesgo de tener este tipo de infecciones. La actividad sexual ha cambiado desde que le hicieron la ltima prueba de deteccin y tiene un riesgo mayor de tener clamidia o gonorrea. Pregntele al mdico si usted tiene riesgo. Pregntele al mdico si usted tiene un alto riesgo de contraer VIH. El mdico tambin puede recomendarle un medicamento recetado para ayudar a evitar la infeccin por el VIH. Si elige tomar medicamentos para prevenir el VIH, primero debe hacerse los anlisis de deteccin del VIH. Luego debe hacerse anlisis cada 3 meses mientras est tomando los medicamentos. Siga estas indicaciones en su casa: Consumo de alcohol No beba alcohol si el mdico se lo prohbe. Si bebe alcohol: Limite la cantidad que consume de 0 a 2 bebidas por da. Sepa cunta cantidad de alcohol hay en las bebidas que toma. En [place], una medida equivale a una botella de cerveza de 12 oz (355 ml), un vaso de vino de 5 oz (148 ml) o un vaso de una bebida alcohlica de alta graduacin de 1 oz (44 ml). Estilo de vida No consuma ningn producto que contenga nicotina o tabaco. Estos productos incluyen cigarrillos, tabaco para mascar y aparatos de vapeo, como los cigarrillos electrnicos. Si necesita ayuda para dejar de consumir estos productos, consulte al mdico. No consuma drogas. No comparta agujas. Solicite ayuda a su mdico si necesita apoyo o informacin para abandonar las drogas. Indicaciones generales Realcese los estudios de rutina de la salud, dentales y de la vista. Mantngase al da con las vacunas. Infrmele a su mdico si: Se siente deprimido con frecuencia. Alguna vez ha sido vctima de maltrato o no se siente seguro en su  casa. Resumen Adoptar un estilo de vida saludable y recibir atencin preventiva son importantes para promover la salud y el bienestar. Siga las instrucciones del mdico acerca de una dieta saludable, el ejercicio y la realizacin de pruebas o exmenes para detectar enfermedades. Siga las instrucciones del mdico con respecto al control del colesterol y la presin arterial. Esta informacin no tiene como fin reemplazar el consejo del mdico. Asegrese de hacerle al mdico cualquier pregunta que tenga. Document Revised: 07/24/2020 Document Reviewed: 07/24/2020 Elsevier Patient Education  2023 Elsevier Inc.  

## 2021-06-25 NOTE — Progress Notes (Signed)
Eric Floyd ?76 y.o. ? ? ?Chief Complaint  ?Patient presents with  ? Annual Exam  ? nasal drainage   ? Allergies  ?  Seasonal   ? ? ?HISTORY OF PRESENT ILLNESS: ?This is a 77 y.o. male here for annual exam.  Accompanied by wife. ?Patient has history of Alzheimer's disease, hypertension, and dyslipidemia. ?Has been seen by cardiologist and neurologist in the past 12 months.  Stable. ?Sinus bradycardia on EKG.  Normotensive. ?Wife states he has seasonal allergies with chronic runny nose. ?No other complaints or medical concerns today. ? ?HPI ? ? ?Prior to Admission medications   ?Medication Sig Start Date End Date Taking? Authorizing Provider  ?Ascorbic Acid (VITAMIN C) 100 MG tablet Take 500 mg by mouth daily.    [provider]  ?Calcium Carb-Cholecalciferol (CALCIUM 600 + D PO) Take by mouth.    [provider]  ?cetirizine (ZYRTEC) 10 MG tablet Take 10 mg by mouth daily.    [provider]  ?Cyanocobalamin (VITAMIN B-12 PO) Take by mouth.    [provider]  ?lisinopril (ZESTRIL) 10 MG tablet Take 1 tablet (10 mg total) by mouth daily. 06/19/20   Horald Pollen, MD  ?memantine (NAMENDA) 10 MG tablet Take 1 tablet (10 mg total) by mouth 2 (two) times daily. 02/25/21   Suzzanne Cloud, NP  ?Multiple Vitamin (MULTIVITAMIN) tablet Take 1 tablet by mouth daily.    [provider]  ?Omega-3 Fatty Acids (FISH OIL OMEGA-3 PO) Take by mouth.    [provider]  ?OVER THE COUNTER MEDICATION     [provider]  ?OVER THE COUNTER MEDICATION     [provider]  ?rosuvastatin (CRESTOR) 10 MG tablet Take 1 tablet (10 mg total) by mouth daily. 06/19/20   Horald Pollen, MD  ?Vitamin D, Ergocalciferol, (DRISDOL) 1.25 MG (50000 UNIT) CAPS capsule TAKE 1 CAPSULE EVERY 7 DAYS 02/26/20   Horald Pollen, MD  ? ? ?No Known Allergies ? ?Patient Active Problem List  ? Diagnosis Date Noted  ? Sinus bradycardia on ECG 08/29/2020  ? Alzheimer's  disease ([place]) 06/19/2020  ? Essential hypertension 06/19/2020  ? Diverticulosis 06/19/2020  ? Memory difficulty 02/19/2020  ? Vitamin D deficiency 12/21/2019  ? Dyslipidemia 12/21/2019  ? ? ?Past Medical History:  ?Diagnosis Date  ? Hyperlipidemia   ? Memory difficulty 02/19/2020  ? ? ?Past Surgical History:  ?Procedure Laterality Date  ? NO PAST SURGERIES    ? ? ?Social History  ? ?Socioeconomic History  ? Marital status: Married  ?  Spouse name: Not on file  ? Number of children: Not on file  ? Years of education: Not on file  ? Highest education level: Not on file  ?Occupational History  ? Not on file  ?Tobacco Use  ? Smoking status: Never  ? Smokeless tobacco: Never  ?Vaping Use  ? Vaping Use: Never used  ?Substance and Sexual Activity  ? Alcohol use: Yes  ?  Alcohol/week: 21.0 standard drinks  ?  Types: 21 Shots of liquor per week  ?  Comment: 3 drinks a day per pt  ? Drug use: Never  ? Sexual activity: Not on file  ?Other Topics Concern  ? Not on file  ?Social History Narrative  ? Lives with wife  ? Right handed  ? Drinks 1-2 cups caffeine daily  ? ?Social Determinants of Health  ? ?Financial Resource Strain: Not on file  ?Food Insecurity: Not on file  ?  Transportation Needs: Not on file  ?Physical Activity: Not on file  ?Stress: Not on file  ?Social Connections: Not on file  ?Intimate Partner Violence: Not on file  ? ? ?Family History  ?Problem Relation Age of Onset  ? Stroke Mother   ? Heart disease Brother   ? Heart attack Brother   ? Ovarian cancer Niece   ? Colon cancer Neg Hx   ? Colon polyps Neg Hx   ? Esophageal cancer Neg Hx   ? Rectal cancer Neg Hx   ? Stomach cancer Neg Hx   ? ? ? ?Review of Systems  ?Constitutional: Negative.  Negative for chills and fever.  ?HENT: Negative.    ?     Chronic rhinorrhea  ?Respiratory: Negative.  Negative for cough and shortness of breath.   ?Cardiovascular: Negative.  Negative for chest pain.  ?Gastrointestinal:  Negative for abdominal pain, diarrhea, nausea and  vomiting.  ?Genitourinary: Negative.   ?Skin: Negative.  Negative for rash.  ?Neurological:  Negative for dizziness and headaches.  ?Endo/Heme/Allergies:  Positive for environmental allergies.  ?All other systems reviewed and are negative. ? ?Today's Vitals  ? 06/25/21 0917  ?BP: 110/68  ?Pulse: (!) 47  ?Temp: 98 ?F (36.7 ?C)  ?TempSrc: Oral  ?SpO2: 98%  ?Weight: 150 lb 6 oz (68.2 kg)  ?Height: '5\' 6"'$  (1.676 m)  ? ?Body mass index is 24.27 kg/m?. ?Wt Readings from Last 3 Encounters:  ?06/25/21 150 lb 6 oz (68.2 kg)  ?02/25/21 151 lb (68.5 kg)  ?09/04/20 146 lb 6.4 oz (66.4 kg)  ? ? ?Physical Exam ?Vitals reviewed.  ?Constitutional:   ?   Appearance: Normal appearance.  ?HENT:  ?   Head: Normocephalic.  ?   Right Ear: Tympanic membrane, ear canal and external ear normal.  ?   Left Ear: Tympanic membrane, ear canal and external ear normal.  ?   Mouth/Throat:  ?   Mouth: Mucous membranes are moist.  ?   Pharynx: Oropharynx is clear.  ?Eyes:  ?   Extraocular Movements: Extraocular movements intact.  ?   Conjunctiva/sclera: Conjunctivae normal.  ?   Pupils: Pupils are equal, round, and reactive to light.  ?Cardiovascular:  ?   Rate and Rhythm: Regular rhythm. Bradycardia present.  ?   Heart sounds: Murmur heard.  ?Systolic murmur is present with a grade of 3/6.  ?Pulmonary:  ?   Effort: Pulmonary effort is normal.  ?   Breath sounds: Normal breath sounds.  ?Abdominal:  ?   General: There is no distension.  ?   Palpations: Abdomen is soft.  ?   Tenderness: There is no abdominal tenderness. There is no guarding.  ?Musculoskeletal:  ?   Cervical back: No tenderness.  ?   Right lower leg: No edema.  ?   Left lower leg: No edema.  ?Lymphadenopathy:  ?   Cervical: No cervical adenopathy.  ?Skin: ?   General: Skin is warm and dry.  ?   Capillary Refill: Capillary refill takes less than 2 seconds.  ?   Comments: Slight purplish discoloration of nailbeds and toenails with good capillary refill  ?Excellent peripheral pulses ?Hands  and feet slightly cool to touch  ?Neurological:  ?   General: No focal deficit present.  ?   Mental Status: He is alert.  ?Psychiatric:     ?   Mood and Affect: Mood normal.     ?   Behavior: Behavior normal.  ? ? ?EKG: Sinus bradycardia with  ventricular rate of 48/min.  No acute ischemic changes. ? ?ASSESSMENT & PLAN: ?Problem List Items Addressed This Visit   ? ?  ? Cardiovascular and Mediastinum  ? Essential hypertension  ? Relevant Medications  ? rosuvastatin (CRESTOR) 10 MG tablet  ? lisinopril (ZESTRIL) 10 MG tablet  ? Other Relevant Orders  ? Comprehensive metabolic panel  ? Hemoglobin A1c  ?  ? Nervous and Auditory  ? Alzheimer's disease ([place])  ?  ? Other  ? Dyslipidemia  ? Relevant Medications  ? rosuvastatin (CRESTOR) 10 MG tablet  ? Other Relevant Orders  ? Hemoglobin A1c  ? Lipid panel  ? Sinus bradycardia on ECG  ? ?Other Visit Diagnoses   ? ? Encounter for general adult medical examination with abnormal findings    -  Primary  ? Need for hepatitis C screening test      ? Relevant Orders  ? Hepatitis C antibody screen  ? Screening for deficiency anemia      ? Relevant Orders  ? CBC with Differential  ? Screening for lipoid disorders      ? Screening for endocrine, metabolic and immunity disorder      ? Relevant Orders  ? Hemoglobin A1c  ? Rhinorrhea      ? Relevant Medications  ? azelastine (ASTELIN) 0.1 % nasal spray  ? ?  ? ?Modifiable risk factors discussed with patient. ?Anticipatory guidance according to age provided. ?The following topics were also discussed: ?Social Determinants of Health ?Smoking.  Non-smoker ?Diet and nutrition ?Benefits of exercise ?Cancer screening and review of most recent colonoscopy report ?Vaccinations recommendations ?Cardiovascular risk assessment ?Mental health including depression and anxiety ?Review of chronic medical problems and their management. ?Fall and accident prevention ? ?Patient Instructions  ?Mantenimiento de [employer] hombres ?Health Maintenance,  Male ?Adoptar un estilo de vida saludable y recibir atenci?n preventiva son importantes para promover la salud y Mus[employer]. Consulte al m?dico sobre: ?El esquema adecuado para [place] y ex?menes

## 2021-06-26 LAB — HEPATITIS C ANTIBODY
Hepatitis C Ab: NONREACTIVE
SIGNAL TO CUT-OFF: 0.19 (ref <1.00)

## 2021-08-01 ENCOUNTER — Ambulatory Visit (INDEPENDENT_AMBULATORY_CARE_PROVIDER_SITE_OTHER): Payer: PPO

## 2021-08-01 VITALS — BP 110/60 | HR 44 | Temp 98.2°F | Ht 66.0 in | Wt 148.8 lb

## 2021-08-01 DIAGNOSIS — Z0000 Encounter for general adult medical examination without abnormal findings: Secondary | ICD-10-CM | POA: Diagnosis not present

## 2021-08-01 NOTE — Progress Notes (Signed)
Subjective:   Eric Floyd is a 76 y.o. male who presents for Medicare Annual/Subsequent preventive examination.  Review of Systems     Cardiac Risk Factors include: advanced age (>34mn, >>24women);hypertension;family history of premature cardiovascular disease;male gender;sedentary lifestyle     Objective:    Today's Vitals   08/01/21 1648  BP: 110/60  Pulse: (!) 44  Temp: 98.2 F (36.8 C)  SpO2: 97%  Weight: 148 lb 12.8 oz (67.5 kg)  Height: '5\' 6"'$  (1.676 m)  PainSc: 0-No pain   Body mass index is 24.02 kg/m.     08/01/2021    4:53 PM  Advanced Directives  Does Patient Have a Medical Advance Directive? No  Would patient like information on creating a medical advance directive? No - Patient declined    Current Medications (verified) Outpatient Encounter Medications as of 08/01/2021  Medication Sig   Ascorbic Acid (VITAMIN C) 100 MG tablet Take 500 mg by mouth daily.   azelastine (ASTELIN) 0.1 % nasal spray Place 1 spray into both nostrils 2 (two) times daily. Use in each nostril as directed   Calcium Carb-Cholecalciferol (CALCIUM 600 + D PO) Take by mouth.   cetirizine (ZYRTEC) 10 MG tablet Take 10 mg by mouth daily.   Cyanocobalamin (VITAMIN B-12 PO) Take by mouth.   lisinopril (ZESTRIL) 10 MG tablet Take 1 tablet (10 mg total) by mouth daily.   memantine (NAMENDA) 10 MG tablet Take 1 tablet (10 mg total) by mouth 2 (two) times daily.   Multiple Vitamin (MULTIVITAMIN) tablet Take 1 tablet by mouth daily.   Omega-3 Fatty Acids (FISH OIL OMEGA-3 PO) Take by mouth.   OVER THE COUNTER MEDICATION    OVER THE COUNTER MEDICATION    rosuvastatin (CRESTOR) 10 MG tablet Take 1 tablet (10 mg total) by mouth daily.   Vitamin D, Ergocalciferol, (DRISDOL) 1.25 MG (50000 UNIT) CAPS capsule TAKE 1 CAPSULE EVERY 7 DAYS   No facility-administered encounter medications on file as of 08/01/2021.    Allergies (verified) Patient has no known allergies.   History: Past Medical  History:  Diagnosis Date   Hyperlipidemia    Memory difficulty 02/19/2020   Past Surgical History:  Procedure Laterality Date   NO PAST SURGERIES     Family History  Problem Relation Age of Onset   Stroke Mother    Heart disease Brother    Heart attack Brother    Ovarian cancer Niece    Colon cancer Neg Hx    Colon polyps Neg Hx    Esophageal cancer Neg Hx    Rectal cancer Neg Hx    Stomach cancer Neg Hx    Social History   Socioeconomic History   Marital status: Married    Spouse name: Not on file   Number of children: Not on file   Years of education: Not on file   Highest education level: Not on file  Occupational History   Not on file  Tobacco Use   Smoking status: Never   Smokeless tobacco: Never  Vaping Use   Vaping Use: Never used  Substance and Sexual Activity   Alcohol use: Yes    Alcohol/week: 21.0 standard drinks    Types: 21 Shots of liquor per week    Comment: 3 drinks a day per pt   Drug use: Never   Sexual activity: Not on file  Other Topics Concern   Not on file  Social History Narrative   Lives with wife   Right  handed   Drinks 1-2 cups caffeine daily   Social Determinants of Health   Financial Resource Strain: Low Risk    Difficulty of Paying Living Expenses: Not hard at all  Food Insecurity: No Food Insecurity   Worried About Charity fundraiser in the Last Year: Never true   Arboriculturist in the Last Year: Never true  Transportation Needs: No Transportation Needs   Lack of Transportation (Medical): No   Lack of Transportation (Non-Medical): No  Physical Activity: Inactive   Days of Exercise per Week: 0 days   Minutes of Exercise per Session: 0 min  Stress: No Stress Concern Present   Feeling of Stress : Not at all  Social Connections: Socially Integrated   Frequency of Communication with Friends and Family: More than three times a week   Frequency of Social Gatherings with Friends and Family: More than three times a week    Attends Religious Services: More than 4 times per year   Active Member of [employer] or Organizations: Yes   Attends Music therapist: More than 4 times per year   Marital Status: Married    Tobacco Counseling Counseling given: Not Answered   Clinical Intake:  Pre-visit preparation completed: Yes  Pain : No/denies pain Pain Score: 0-No pain     BMI - recorded: 24.02 Nutritional Risks: None Diabetes: No  How often do you need to have someone help you when you read instructions, pamphlets, or other written materials from your doctor or pharmacy?: 1 - Never What is the last grade level you completed in school?: Degree in Mechanical Engineering  Diabetic? no  Interpreter Needed?: Yes Interpreter Agency: [place] Interpreter Name: Lockie Mola Patient Declined Interpreter : No Patient signed [place] waiver: No  Information entered by :: Lisette Abu, LPN.   Activities of Daily Living    08/01/2021    4:54 PM  In your present state of health, do you have any difficulty performing the following activities:  Hearing? 0  Vision? 0  Difficulty concentrating or making decisions? 1  Walking or climbing stairs? 0  Dressing or bathing? 0  Doing errands, shopping? 1  Preparing Food and eating ? N  Using the Toilet? N  In the past six months, have you accidently leaked urine? N  Do you have problems with loss of bowel control? N  Managing your Medications? N  Managing your Finances? N  Housekeeping or managing your Housekeeping? N    Patient Care Team: Horald Pollen, MD as PCP - General (Internal Medicine)  Indicate any recent Medical Services you may have received from other than [facility] providers in the past year (date may be approximate).     Assessment:   This is a routine wellness examination for Eric Floyd.  Hearing/Vision screen Hearing Screening - Comments:: Patient denied any hearing difficulty.   No hearing aids.  Vision Screening -  Comments:: Patient does not wear any corrective lenses/contacts.   Eye exam done by: declined   Dietary issues and exercise activities discussed: Current Exercise Habits: The patient does not participate in regular exercise at present, Exercise limited by: None identified   Goals Addressed             This Visit's Progress    My goal is to make it to next year.        Depression Screen    08/01/2021    4:51 PM 06/25/2021    9:20 AM 08/29/2020  11:22 AM 06/19/2020   10:59 AM 12/20/2019   11:15 AM  PHQ 2/9 Scores  PHQ - 2 Score 0 0 0 0 0  PHQ- 9 Score  4       Fall Risk    08/01/2021    4:54 PM 06/25/2021    9:20 AM 08/29/2020   11:21 AM 06/19/2020   10:59 AM 12/20/2019   11:15 AM  Fall Risk   Falls in the past year? 0 0 1 0 0  Number falls in past yr: 0  0 0   Injury with Fall? 0  0    Risk for fall due to : No Fall Risks   No Fall Risks   Follow up Falls evaluation completed  Falls evaluation completed Falls evaluation completed Falls evaluation completed    [place]:  Any stairs in or around the home? Yes  If so, are there any without handrails? No  Home free of loose throw rugs in walkways, pet beds, electrical cords, etc? Yes  Adequate lighting in your home to reduce risk of falls? Yes   ASSISTIVE DEVICES UTILIZED TO PREVENT FALLS:  Life alert? No  Use of a cane, walker or w/c? No  Grab bars in the bathroom? No  Shower chair or bench in shower? No  Elevated toilet seat or a handicapped toilet? No   TIMED UP AND GO:  Was the test performed? Yes .  Length of time to ambulate 10 feet: 8 sec.   Gait steady and fast without use of assistive device  Cognitive Function:    08/01/2021    4:55 PM 02/25/2021   10:58 AM 08/19/2020    2:57 PM 02/19/2020    3:16 PM  MMSE - Mini Mental State Exam  Not completed: Unable to complete     Orientation to time  '1 1 1  '$ Orientation to Place  '3 3 3  '$ Registration  '3 3 3  '$ Attention/  Calculation  '1 1 3  '$ Recall  0 0 0  Language- name 2 objects  '2 2 2  '$ Language- repeat  0 0 0  Language- repeat-comments  unable to do. UTO- language barrier   Language- follow 3 step command  '1 3 3  '$ Language- read & follow direction  '1 1 1  '$ Write a sentence  '1 1 1  '$ Copy design  '1 1 1  '$ Total score  '14 16 18        '$ Immunizations Immunization History  Administered Date(s) Administered   Fluad Quad(high Dose 65+) 12/20/2019   PFIZER(Purple Top)SARS-COV-2 Vaccination 05/13/2019, 06/03/2019   Pneumococcal Conjugate-13 07/17/2015   Pneumococcal Polysaccharide-23 09/10/2016   Tdap 02/23/2019   Zoster Recombinat (Shingrix) 09/19/2015, 09/18/2016   Zoster, Live 07/17/2015    TDAP status: Up to date  Flu Vaccine status: Declined, Education has been provided regarding the importance of this vaccine but patient still declined. Advised may receive this vaccine at local pharmacy or Health Dept. Aware to provide a copy of the vaccination record if obtained from local pharmacy or Health Dept. Verbalized acceptance and understanding.  Pneumococcal vaccine status: Up to date  Covid-19 vaccine status: Completed vaccines  Qualifies for Shingles Vaccine? Yes   Zostavax completed Yes   Shingrix Completed?: No.    Education has been provided regarding the importance of this vaccine. Patient has been advised to call insurance company to determine out of pocket expense if they have not yet received this vaccine. Advised may  also receive vaccine at local pharmacy or Health Dept. Verbalized acceptance and understanding.  Screening Tests Health Maintenance  Topic Date Due   COVID-19 Vaccine (3 - Booster for Pfizer series) 07/29/2019   INFLUENZA VACCINE  09/30/2021   TETANUS/TDAP  02/22/2029   COLONOSCOPY (Pts 45-49yr Insurance coverage will need to be confirmed)  04/10/2030   Pneumonia Vaccine 75 Years old  Completed   Hepatitis C Screening  Completed   Zoster Vaccines- Shingrix  Completed   HPV  VACCINES  Aged Out    Health Maintenance  Health Maintenance Due  Topic Date Due   COVID-19 Vaccine (3 - Booster for [place]istonseries) 07/29/2019    Colorectal cancer screening: Type of screening: Colonoscopy. Completed 04/10/2020. Repeat every 10 years  Lung Cancer Screening: (Low Dose CT Chest recommended if Age 75-80years, 30 pack-year currently smoking OR have quit w/in 15years.) does not qualify.   Lung Cancer Screening Referral: no  Additional Screening:  Hepatitis C Screening: does qualify; Completed 06/25/2021  Vision Screening: Recommended annual ophthalmology exams for early detection of glaucoma and other disorders of the eye. Is the patient up to date with their annual eye exam?  No  Who is the provider or what is the name of the office in which the patient attends annual eye exams? Refused If pt is not established with a provider, would they like to be referred to a provider to establish care? No .   Dental Screening: Recommended annual dental exams for proper oral hygiene  Community Resource Referral / Chronic Care Management: CRR required this visit?  No   CCM required this visit?  No      Plan:     I have personally reviewed and noted the following in the patient's chart:   Medical and social history Use of alcohol, tobacco or illicit drugs  Current medications and supplements including opioid prescriptions. Patient is not currently taking opioid prescriptions. Functional ability and status Nutritional status Physical activity Advanced directives List of other physicians Hospitalizations, surgeries, and ER visits in previous 12 months Vitals Screenings to include cognitive, depression, and falls Referrals and appointments  In addition, I have reviewed and discussed with patient certain preventive protocols, quality metrics, and best practice recommendations. A written personalized care plan for preventive services as well as general preventive health  recommendations were provided to patient.     SSheral Flow LPN   65/06[birth year]79  Nurse Notes:  Hearing Screening - Comments:: Patient denied any hearing difficulty.   No hearing aids.  Vision Screening - Comments:: Patient does not wear any corrective lenses/contacts.   Eye exam done by: declined

## 2021-08-01 NOTE — Patient Instructions (Signed)
Eric Floyd , Thank you for taking time to come for your Medicare Wellness Visit. I appreciate your ongoing commitment to your health goals. Please review the following plan we discussed and let me know if I can assist you in the future.   Screening recommendations/referrals: Colonoscopy: 04/10/2020 Recommended yearly ophthalmology/optometry visit for glaucoma screening and checkup Recommended yearly dental visit for hygiene and checkup  Vaccinations: Influenza vaccine: denied Pneumococcal vaccine: 07/17/2015, 09/10/2016 Tdap vaccine: 02/23/2019; due every 10 years Shingles vaccine: declined   Covid-19: 05/13/2019, 06/03/2019  Advanced directives: No  Conditions/risks identified: Yes  Next appointment: Please schedule your next Medicare Wellness Visit with your Nurse Health Advisor in 1 year by calling [phone].  Preventive Care 33 Years and Older, Male Preventive care refers to lifestyle choices and visits with your health care provider that can promote health and wellness. What does preventive care include? A yearly physical exam. This is also called an annual well check. Dental exams once or twice a year. Routine eye exams. Ask your health care provider how often you should have your eyes checked. Personal lifestyle choices, including: Daily care of your teeth and gums. Regular physical activity. Eating a healthy diet. Avoiding tobacco and drug use. Limiting alcohol use. Practicing safe sex. Taking low doses of aspirin every day. Taking vitamin and mineral supplements as recommended by your health care provider. What happens during an annual well check? The services and screenings done by your health care provider during your annual well check will depend on your age, overall health, lifestyle risk factors, and family history of disease. Counseling  Your health care provider may ask you questions about your: Alcohol use. Tobacco use. Drug use. Emotional well-being. Home and  relationship well-being. Sexual activity. Eating habits. History of falls. Memory and ability to understand (cognition). Work and work Statistician. Screening  You may have the following tests or measurements: Height, weight, and BMI. Blood pressure. Lipid and cholesterol levels. These may be checked every 5 years, or more frequently if you are over 62 years old. Skin check. Lung cancer screening. You may have this screening every year starting at age 60 if you have a 30-pack-year history of smoking and currently smoke or have quit within the past 15 years. Fecal occult blood test (FOBT) of the stool. You may have this test every year starting at age 47. Flexible sigmoidoscopy or colonoscopy. You may have a sigmoidoscopy every 5 years or a colonoscopy every 10 years starting at age 71. Prostate cancer screening. Recommendations will vary depending on your family history and other risks. Hepatitis C blood test. Hepatitis B blood test. Sexually transmitted disease (STD) testing. Diabetes screening. This is done by checking your blood sugar (glucose) after you have not eaten for a while (fasting). You may have this done every 1-3 years. Abdominal aortic aneurysm (AAA) screening. You may need this if you are a current or former smoker. Osteoporosis. You may be screened starting at age 74 if you are at high risk. Talk with your health care provider about your test results, treatment options, and if necessary, the need for more tests. Vaccines  Your health care provider may recommend certain vaccines, such as: Influenza vaccine. This is recommended every year. Tetanus, diphtheria, and acellular pertussis (Tdap, Td) vaccine. You may need a Td booster every 10 years. Zoster vaccine. You may need this after age 39. Pneumococcal 13-valent conjugate (PCV13) vaccine. One dose is recommended after age 56. Pneumococcal polysaccharide (PPSV23) vaccine. One dose is recommended after age  64. Talk to your  health care provider about which screenings and vaccines you need and how often you need them. This information is not intended to replace advice given to you by your health care provider. Make sure you discuss any questions you have with your health care provider. Document Released: 03/15/2015 Document Revised: 11/06/2015 Document Reviewed: 12/18/2014 Elsevier Interactive Patient Education  2017 [place] Prevention in the Home Falls can cause injuries. They can happen to people of all ages. There are many things you can do to make your home safe and to help prevent falls. What can I do on the outside of my home? Regularly fix the edges of walkways and driveways and fix any cracks. Remove anything that might make you trip as you walk through a door, such as a raised step or threshold. Trim any bushes or trees on the path to your home. Use bright outdoor lighting. Clear any walking paths of anything that might make someone trip, such as rocks or tools. Regularly check to see if handrails are loose or broken. Make sure that both sides of any steps have handrails. Any raised decks and porches should have guardrails on the edges. Have any leaves, snow, or ice cleared regularly. Use sand or salt on walking paths during winter. Clean up any spills in your garage right away. This includes oil or grease spills. What can I do in the bathroom? Use night lights. Install grab bars by the toilet and in the tub and shower. Do not use towel bars as grab bars. Use non-skid mats or decals in the tub or shower. If you need to sit down in the shower, use a plastic, non-slip stool. Keep the floor dry. Clean up any water that spills on the floor as soon as it happens. Remove soap buildup in the tub or shower regularly. Attach bath mats securely with double-sided non-slip rug tape. Do not have throw rugs and other things on the floor that can make you trip. What can I do in the bedroom? Use night  lights. Make sure that you have a light by your bed that is easy to reach. Do not use any sheets or blankets that are too big for your bed. They should not hang down onto the floor. Have a firm chair that has side arms. You can use this for support while you get dressed. Do not have throw rugs and other things on the floor that can make you trip. What can I do in the kitchen? Clean up any spills right away. Avoid walking on wet floors. Keep items that you use a lot in easy-to-reach places. If you need to reach something above you, use a strong step stool that has a grab bar. Keep electrical cords out of the way. Do not use floor polish or wax that makes floors slippery. If you must use wax, use non-skid floor wax. Do not have throw rugs and other things on the floor that can make you trip. What can I do with my stairs? Do not leave any items on the stairs. Make sure that there are handrails on both sides of the stairs and use them. Fix handrails that are broken or loose. Make sure that handrails are as long as the stairways. Check any carpeting to make sure that it is firmly attached to the stairs. Fix any carpet that is loose or worn. Avoid having throw rugs at the top or bottom of the stairs. If you do have  throw rugs, attach them to the floor with carpet tape. Make sure that you have a light switch at the top of the stairs and the bottom of the stairs. If you do not have them, ask someone to add them for you. What else can I do to help prevent falls? Wear shoes that: Do not have high heels. Have rubber bottoms. Are comfortable and fit you well. Are closed at the toe. Do not wear sandals. If you use a stepladder: Make sure that it is fully opened. Do not climb a closed stepladder. Make sure that both sides of the stepladder are locked into place. Ask someone to hold it for you, if possible. Clearly mark and make sure that you can see: Any grab bars or handrails. First and last  steps. Where the edge of each step is. Use tools that help you move around (mobility aids) if they are needed. These include: Canes. Walkers. Scooters. Crutches. Turn on the lights when you go into a dark area. Replace any light bulbs as soon as they burn out. Set up your furniture so you have a clear path. Avoid moving your furniture around. If any of your floors are uneven, fix them. If there are any pets around you, be aware of where they are. Review your medicines with your doctor. Some medicines can make you feel dizzy. This can increase your chance of falling. Ask your doctor what other things that you can do to help prevent falls. This information is not intended to replace advice given to you by your health care provider. Make sure you discuss any questions you have with your health care provider. Document Released: 12/13/2008 Document Revised: 07/25/2015 Document Reviewed: 03/23/2014 Elsevier Interactive Patient Education  2017 [employer].

## 2021-08-26 ENCOUNTER — Ambulatory Visit: Payer: PPO | Admitting: Neurology

## 2021-08-26 ENCOUNTER — Encounter: Payer: Self-pay | Admitting: Neurology

## 2021-08-26 VITALS — BP 147/69 | HR 42 | Ht 66.0 in | Wt 148.0 lb

## 2021-08-26 DIAGNOSIS — F0280 Dementia in other diseases classified elsewhere without behavioral disturbance: Secondary | ICD-10-CM

## 2021-08-26 DIAGNOSIS — G309 Alzheimer's disease, unspecified: Secondary | ICD-10-CM

## 2021-08-26 MED ORDER — MEMANTINE HCL 5 MG PO TABS: 5.0000 mg | ORAL_TABLET | Freq: Two times a day (BID) | ORAL | 1 refills | Status: DC

## 2021-12-25 ENCOUNTER — Ambulatory Visit (INDEPENDENT_AMBULATORY_CARE_PROVIDER_SITE_OTHER): Payer: PPO | Admitting: Emergency Medicine

## 2021-12-25 ENCOUNTER — Encounter: Payer: Self-pay | Admitting: Emergency Medicine

## 2021-12-25 VITALS — BP 130/76 | HR 47 | Temp 97.8°F | Ht 66.0 in | Wt 151.4 lb

## 2021-12-25 DIAGNOSIS — E785 Hyperlipidemia, unspecified: Secondary | ICD-10-CM | POA: Diagnosis not present

## 2021-12-25 DIAGNOSIS — Z23 Encounter for immunization: Secondary | ICD-10-CM | POA: Diagnosis not present

## 2021-12-25 DIAGNOSIS — I10 Essential (primary) hypertension: Secondary | ICD-10-CM | POA: Diagnosis not present

## 2021-12-25 DIAGNOSIS — F0280 Dementia in other diseases classified elsewhere without behavioral disturbance: Secondary | ICD-10-CM

## 2021-12-25 DIAGNOSIS — G309 Alzheimer's disease, unspecified: Secondary | ICD-10-CM

## 2021-12-25 NOTE — Assessment & Plan Note (Signed)
Well-controlled hypertension. Continue lisinopril 10 mg daily. BP Readings from Last 3 Encounters:  12/25/21 130/76  08/26/21 (!) 147/69  08/01/21 110/60

## 2021-12-25 NOTE — Progress Notes (Signed)
Eric Floyd 76 y.o.   Chief Complaint  Patient presents with   Follow-up    21mth f/u appt , no concerns     HISTORY OF PRESENT ILLNESS: This is a 76y.o. male here for 634-monthollow-up of hypertension and dyslipidemia. Also has history of Alzheimer's dementia on Namenda 5 mg twice a day. Overall doing well.  Has no complaints or medical concerns today.  Accompanied by his wife.  Last neurologist office visit assessment and plan as follows: ASSESSMENT AND PLAN [age].o. year old male  has a past medical history of Hyperlipidemia and Memory difficulty (02/19/2020). here with:   1.  Alzheimer's disease, MMSE 14/30 2.  Bradycardia   -Decrease Namenda to 5 mg twice daily, see if improvement in dizziness (reported by his wife), monitor HR, has seen cardiology, determined no intervention needed since asymptomatic  -MMSE 14/30 at last visit -Follow-up with our office in 6 months or sooner if needed   SaEvangeline DakinDNP 08/26/2021, 2:39 PM [facility] [id number] [phone] Readings from Last 3 Encounters:  12/25/21 130/76  08/26/21 (!) 147/69  08/01/21 110/60     HPI   Prior to Admission medications   Medication Sig Start Date End Date Taking? Authorizing Provider  Ascorbic Acid (VITAMIN C) 100 MG tablet Take 500 mg by mouth daily.    [provider]  azelastine (ASTELIN) 0.1 % nasal spray Place 1 spray into both nostrils 2 (two) times daily. Use in each nostril as directed 06/25/21   SaHorald PollenMD  Calcium Carb-Cholecalciferol (CALCIUM 600 + D PO) Take by mouth.    [provider]  cetirizine (ZYRTEC) 10 MG tablet Take 10 mg by mouth daily.    [provider]  Cyanocobalamin (VITAMIN B-12 PO) Take by mouth.    [provider]  lisinopril (ZESTRIL) 10 MG tablet Take 1 tablet (10 mg total) by mouth daily. 06/25/21   SaHorald PollenMD  memantine  (NAMENDA) 5 MG tablet Take 1 tablet (5 mg total) by mouth 2 (two) times daily. 08/26/21   SlSuzzanne CloudNP  Multiple Vitamin (MULTIVITAMIN) tablet Take 1 tablet by mouth daily.    [provider]  Omega-3 Fatty Acids (FISH OIL OMEGA-3 PO) Take by mouth.    [provider]  OVER THE COUNTER MEDICATION     [provider]  OVTurner   [provider]  rosuvastatin (CRESTOR) 10 MG tablet Take 1 tablet (10 mg total) by mouth daily. 06/25/21   SaHorald PollenMD  Vitamin D, Ergocalciferol, (DRISDOL) 1.25 MG (50000 UNIT) CAPS capsule TAKE 1 CAPSULE EVERY 7 DAYS 02/26/20   SaHorald PollenMD    No Known Allergies  Patient Active Problem List   Diagnosis Date Noted   Sinus bradycardia on ECG 08/29/2020   Alzheimer's disease ([place]/20/2022   Essential hypertension 06/19/2020   Diverticulosis 06/19/2020   Memory difficulty 02/19/2020   Vitamin D deficiency 12/21/2019   Dyslipidemia 12/21/2019    Past Medical History:  Diagnosis Date   Hyperlipidemia    Memory difficulty 02/19/2020    Past Surgical History:  Procedure Laterality Date   NO PAST SURGERIES      Social History   Socioeconomic History   Marital status: Married    Spouse name: Not on file   Number of children: Not on file   Years of education:  Not on file   Highest education level: Not on file  Occupational History   Not on file  Tobacco Use   Smoking status: Never   Smokeless tobacco: Never  Vaping Use   Vaping Use: Never used  Substance and Sexual Activity   Alcohol use: Yes    Alcohol/week: 21.0 standard drinks of alcohol    Types: 21 Shots of liquor per week    Comment: 3 drinks a day per pt   Drug use: Never   Sexual activity: Not on file  Other Topics Concern   Not on file  Social History Narrative   Lives with wife   Right handed   Drinks 1-2 cups caffeine daily   Social Determinants of Health   Financial Resource Strain: Low  Risk  (08/01/2021)   Overall Financial Resource Strain (CARDIA)    Difficulty of Paying Living Expenses: Not hard at all  Food Insecurity: No Food Insecurity (08/01/2021)   Hunger Vital Sign    Worried About Running Out of Food in the Last Year: Never true    Ran Out of Food in the Last Year: Never true  Transportation Needs: No Transportation Needs (08/01/2021)   PRAPARE - Hydrologist (Medical): No    Lack of Transportation (Non-Medical): No  Physical Activity: Inactive (08/01/2021)   Exercise Vital Sign    Days of Exercise per Week: 0 days    Minutes of Exercise per Session: 0 min  Stress: No Stress Concern Present (08/01/2021)   [place]    Feeling of Stress : Not at all  Social Connections: Socially Integrated (08/01/2021)   Social Connection and Isolation Panel [NHANES]    Frequency of Communication with Friends and Family: More than three times a week    Frequency of Social Gatherings with Friends and Family: More than three times a week    Attends Religious Services: More than 4 times per year    Active Member of [employer] or Organizations: Yes    Attends Music therapist: More than 4 times per year    Marital Status: Married  Human resources officer Violence: Not At Risk (08/01/2021)   Humiliation, Afraid, Rape, and Kick questionnaire    Fear of Current or Ex-Partner: No    Emotionally Abused: No    Physically Abused: No    Sexually Abused: No    Family History  Problem Relation Age of Onset   Stroke Mother    Heart disease Brother    Heart attack Brother    Ovarian cancer Niece    Colon cancer Neg Hx    Colon polyps Neg Hx    Esophageal cancer Neg Hx    Rectal cancer Neg Hx    Stomach cancer Neg Hx      Review of Systems  Constitutional: Negative.  Negative for chills and fever.  HENT: Negative.  Negative for congestion and sore throat.   Respiratory: Negative.  Negative  for cough and shortness of breath.   Cardiovascular: Negative.  Negative for chest pain and palpitations.  Gastrointestinal: Negative.  Negative for abdominal pain, diarrhea, nausea and vomiting.  Genitourinary: Negative.  Negative for dysuria and hematuria.  Skin: Negative.  Negative for rash.  Neurological: Negative.  Negative for dizziness and headaches.  All other systems reviewed and are negative.   Today's Vitals   12/25/21 1104  BP: 130/76  Pulse: (!) 47  Temp: 97.8 F (36.6 C)  TempSrc: Oral  SpO2: 96%  Weight: 151 lb 6 oz (68.7 kg)  Height: '5\' 6"'$  (1.676 m)   Body mass index is 24.43 kg/m. Wt Readings from Last 3 Encounters:  12/25/21 151 lb 6 oz (68.7 kg)  08/26/21 148 lb (67.1 kg)  08/01/21 148 lb 12.8 oz (67.5 kg)    Physical Exam Vitals reviewed.  Constitutional:      Appearance: Normal appearance.  HENT:     Head: Normocephalic.     Mouth/Throat:     Mouth: Mucous membranes are moist.     Pharynx: Oropharynx is clear.  Eyes:     Extraocular Movements: Extraocular movements intact.     Pupils: Pupils are equal, round, and reactive to light.  Cardiovascular:     Rate and Rhythm: Bradycardia present.     Heart sounds: Murmur heard.  Pulmonary:     Effort: Pulmonary effort is normal.     Breath sounds: Normal breath sounds.  Abdominal:     Palpations: Abdomen is soft.     Tenderness: There is no abdominal tenderness.  Musculoskeletal:     Cervical back: No tenderness.     Right lower leg: No edema.     Left lower leg: No edema.  Lymphadenopathy:     Cervical: No cervical adenopathy.  Skin:    General: Skin is warm and dry.  Neurological:     General: No focal deficit present.     Mental Status: He is alert and oriented to person, place, and time.  Psychiatric:        Mood and Affect: Mood normal.        Behavior: Behavior normal.     ASSESSMENT & PLAN: A total of 43 minutes was spent with the patient and counseling/coordination of care  regarding preparing for this visit, review of most recent office visit note, review of most recent neurologist office visit note, review of multiple chronic medical problems and their management, review of all medications, review of most recent blood work results, education on nutrition, prognosis, documentation, and need for follow-up.  Problem List Items Addressed This Visit       Cardiovascular and Mediastinum   Essential hypertension - Primary    Well-controlled hypertension. Continue lisinopril 10 mg daily. BP Readings from Last 3 Encounters:  12/25/21 130/76  08/26/21 (!) 147/69  08/01/21 110/60           Nervous and Auditory   Alzheimer's disease ([place])    Stable.  Follows up with neurologist on a regular basis. Continue Namenda 5 mg twice a day.        Other   Dyslipidemia    Stable.  Diet and nutrition reviewed with patient Encouraged to walk more often. Continue rosuvastatin 10 mg daily.      Other Visit Diagnoses     Need for vaccination       Relevant Orders   Flu Vaccine QUAD High Dose(Fluad) (Completed)      Patient Instructions  Mantenimiento de la salud despus de los 66 aos de edad Health Maintenance After Age 54 Despus de los 65 aos de edad, corre un riesgo mayor de Tourist information centre manager enfermedades e infecciones a Barrister's clerk, como tambin de sufrir lesiones por cadas. Las cadas son la causa principal de las fracturas de huesos y lesiones en la cabeza de personas mayores de 57 aos de edad. Recibir cuidados preventivos de forma regular puede ayudarlo a mantenerse saludable y en buen [place]. Los cuidados preventivos incluyen realizarse  anlisis de forma regular y Paramedic de vida segn las recomendaciones del mdico. Converse con el mdico sobre lo siguiente: Las pruebas de deteccin y los anlisis que debe Dispensing optician. Una prueba de deteccin es un estudio que se para Hydrographic surveyor la presencia de una enfermedad cuando no tiene  sntomas. Un plan de dieta y ejercicios adecuado para usted. Qu debo saber sobre las pruebas de deteccin y los anlisis para prevenir cadas? Realizarse pruebas de deteccin y [employer] es la mejor manera de Hydrographic surveyor un problema de salud de forma temprana. El diagnstico y tratamiento tempranos le brindan la mejor oportunidad de Chief Technology Officer las afecciones mdicas que son comunes despus de los 71 aos de edad. Ciertas afecciones y elecciones de estilo de vida pueden hacer que sea ms propenso a sufrir Engineer, manufacturing. El mdico puede recomendarle lo siguiente: Controles regulares de la visin. Una visin deficiente y afecciones como las cataratas pueden hacer que sea ms propenso a sufrir Engineer, manufacturing. Si [place] lentes, asegrese de obtener una receta actualizada si su visin cambia. Revisin de medicamentos. Revise regularmente con el mdico todos los medicamentos que toma, incluidos los medicamentos de [place]. Consulte al [employer] efectos secundarios que pueden hacer que sea ms propenso a sufrir Engineer, manufacturing. Informe al mdico si alguno de los medicamentos que toma lo hace sentir mareado o somnoliento. Controles de fuerza y equilibrio. El mdico puede recomendar ciertos estudios para controlar su fuerza y equilibrio al estar de pie, al caminar o al cambiar de posicin. Examen de los pies. El dolor y Chiropractor en los pies, como tambin no utilizar el calzado [place], pueden hacer que sea ms propenso a sufrir Engineer, manufacturing. Pruebas de deteccin, que incluyen las siguientes: Pruebas de deteccin para la osteoporosis. La osteoporosis es una afeccin que hace que los huesos se tornen ms dbiles y se quiebren con ms facilidad. Pruebas de deteccin para la presin arterial. Los cambios en la presin arterial y los medicamentos para Chief Technology Officer la presin arterial pueden hacerlo sentir mareado. Prueba de deteccin de la depresin. Es ms probable que sufra una cada si tiene miedo a caerse, se siente  deprimido o se siente incapaz de Patent examiner. Prueba de deteccin de consumo de alcohol. Beber demasiado alcohol puede afectar su equilibrio y puede hacer que sea ms propenso a sufrir Engineer, manufacturing. Siga estas indicaciones en su casa: Estilo de vida No beba alcohol si: Su mdico le indica no hacerlo. Si bebe alcohol: Limite la cantidad que bebe a lo siguiente: De 0 a 1 medida por da para las mujeres. De 0 a 2 medidas por da para los hombres. Sepa cunta cantidad de alcohol hay en las bebidas que toma. En los [place], una medida equivale a una botella de cerveza de 12 oz (355 ml), un vaso de vino de 5 oz (148 ml) o un vaso de una bebida alcohlica de alta graduacin de 1 oz (44 ml). No consuma ningn producto que contenga nicotina o tabaco. Estos productos incluyen cigarrillos, tabaco para Higher education careers adviser y aparatos de vapeo, como los Psychologist, sport and exercise. Si necesita ayuda para dejar de consumir estos productos, consulte al [employer]. Actividad  Siga un programa de ejercicio regular para mantenerse en forma. Esto lo ayudar a Contractor equilibrio. Consulte al mdico qu tipos de ejercicios son adecuados para usted. Si necesita un bastn o un andador, selo segn las recomendaciones del mdico. Utilice calzado con buen apoyo y suela antideslizante. Seguridad  Mirant  objetos que puedan causar tropiezos tales como alfombras, cables u obstculos. Instale equipos de seguridad, como barras para sostn en los baos y barandas de seguridad en las escaleras. [place] habitaciones y los pasillos bien iluminados. Indicaciones generales Hable con el mdico sobre sus riesgos de sufrir una cada. Infrmele a su mdico si: Se cae. Asegrese de informarle a su mdico acerca de todas las cadas, incluso aquellas que parecen ser [employer] Se siente mareado, cansado (tiene fatiga) o siente que pierde el equilibrio. Use los medicamentos de venta libre y los recetados solamente  como se lo haya indicado el mdico. Estos incluyen suplementos. Siga una dieta sana y [place] un peso saludable. Una dieta saludable incluye productos lcteos descremados, carnes bajas en contenido de grasa ([place]), fibra de granos enteros, frijoles y [place] frutas y verduras. [place]. Realcese los estudios de rutina de la salud, dentales y de Public librarian. Resumen Tener un estilo de vida saludable y recibir cuidados preventivos pueden ayudar a Theatre stage manager salud y el bienestar despus de los 5 aos de [place]. Realizarse pruebas de deteccin y [employer] es la mejor manera de Hydrographic surveyor un problema de salud de forma temprana y Lourena Simmonds a Product/process development scientist una cada. El diagnstico y tratamiento tempranos le brindan la mejor oportunidad de Chief Technology Officer las afecciones mdicas ms comunes en las personas mayores de 74 aos de edad. Las cadas son la causa principal de las fracturas de huesos y lesiones en la cabeza de personas mayores de 80 aos de edad. Tome precauciones para evitar una cada en su casa. Trabaje con el mdico para saber qu cambios que puede hacer para mejorar su salud y [place], y [place]. Esta informacin no tiene Marine scientist el consejo del mdico. Asegrese de hacerle al mdico cualquier pregunta que tenga. Document Revised: 07/24/2020 Document Reviewed: 07/24/2020 Elsevier Patient Education  [place], MD [facility]

## 2021-12-25 NOTE — Assessment & Plan Note (Signed)
Stable.  Diet and nutrition reviewed with patient Encouraged to walk more often. Continue rosuvastatin 10 mg daily.

## 2021-12-25 NOTE — Patient Instructions (Signed)
Mantenimiento de la salud despus de los 65 aos de edad Health Maintenance After Age 75 Despus de los 65 aos de edad, corre un riesgo mayor de padecer ciertas enfermedades e infecciones a largo plazo, como tambin de sufrir lesiones por cadas. Las cadas son la causa principal de las fracturas de huesos y lesiones en la cabeza de personas mayores de 65 aos de edad. Recibir cuidados preventivos de forma regular puede ayudarlo a mantenerse saludable y en buen estado. Los cuidados preventivos incluyen realizarse anlisis de forma regular y realizar cambios en el estilo de vida segn las recomendaciones del mdico. Converse con el mdico sobre lo siguiente: Las pruebas de deteccin y los anlisis que debe realizarse. Una prueba de deteccin es un estudio que se para detectar la presencia de una enfermedad cuando no tiene sntomas. Un plan de dieta y ejercicios adecuado para usted. Qu debo saber sobre las pruebas de deteccin y los anlisis para prevenir cadas? Realizarse pruebas de deteccin y anlisis es la mejor manera de detectar un problema de salud de forma temprana. El diagnstico y tratamiento tempranos le brindan la mejor oportunidad de controlar las afecciones mdicas que son comunes despus de los 65 aos de edad. Ciertas afecciones y elecciones de estilo de vida pueden hacer que sea ms propenso a sufrir una cada. El mdico puede recomendarle lo siguiente: Controles regulares de la visin. Una visin deficiente y afecciones como las cataratas pueden hacer que sea ms propenso a sufrir una cada. Si usa lentes, asegrese de obtener una receta actualizada si su visin cambia. Revisin de medicamentos. Revise regularmente con el mdico todos los medicamentos que toma, incluidos los medicamentos de venta libre. Consulte al mdico sobre los efectos secundarios que pueden hacer que sea ms propenso a sufrir una cada. Informe al mdico si alguno de los medicamentos que toma lo hace sentir mareado o  somnoliento. Controles de fuerza y equilibrio. El mdico puede recomendar ciertos estudios para controlar su fuerza y equilibrio al estar de pie, al caminar o al cambiar de posicin. Examen de los pies. El dolor y el adormecimiento en los pies, como tambin no utilizar el calzado adecuado, pueden hacer que sea ms propenso a sufrir una cada. Pruebas de deteccin, que incluyen las siguientes: Pruebas de deteccin para la osteoporosis. La osteoporosis es una afeccin que hace que los huesos se tornen ms dbiles y se quiebren con ms facilidad. Pruebas de deteccin para la presin arterial. Los cambios en la presin arterial y los medicamentos para controlar la presin arterial pueden hacerlo sentir mareado. Prueba de deteccin de la depresin. Es ms probable que sufra una cada si tiene miedo a caerse, se siente deprimido o se siente incapaz de realizar actividades que sola hacer. Prueba de deteccin de consumo de alcohol. Beber demasiado alcohol puede afectar su equilibrio y puede hacer que sea ms propenso a sufrir una cada. Siga estas indicaciones en su casa: Estilo de vida No beba alcohol si: Su mdico le indica no hacerlo. Si bebe alcohol: Limite la cantidad que bebe a lo siguiente: De 0 a 1 medida por da para las mujeres. De 0 a 2 medidas por da para los hombres. Sepa cunta cantidad de alcohol hay en las bebidas que toma. En los [place], una medida equivale a una botella de cerveza de 12 oz (355 ml), un vaso de vino de 5 oz (148 ml) o un vaso de una bebida alcohlica de alta graduacin de 1 oz (44 ml). No consuma ningn producto que   contenga nicotina o tabaco. Estos productos incluyen cigarrillos, tabaco para mascar y aparatos de vapeo, como los cigarrillos electrnicos. Si necesita ayuda para dejar de consumir estos productos, consulte al mdico. Actividad  Siga un programa de ejercicio regular para mantenerse en forma. Esto lo ayudar a mantener el equilibrio. Consulte al  mdico qu tipos de ejercicios son adecuados para usted. Si necesita un bastn o un andador, selo segn las recomendaciones del mdico. Utilice calzado con buen apoyo y suela antideslizante. Seguridad  Retire los objetos que puedan causar tropiezos tales como alfombras, cables u obstculos. Instale equipos de seguridad, como barras para sostn en los baos y barandas de seguridad en las escaleras. Mantenga las habitaciones y los pasillos bien iluminados. Indicaciones generales Hable con el mdico sobre sus riesgos de sufrir una cada. Infrmele a su mdico si: Se cae. Asegrese de informarle a su mdico acerca de todas las cadas, incluso aquellas que parecen ser menores. Se siente mareado, cansado (tiene fatiga) o siente que pierde el equilibrio. Use los medicamentos de venta libre y los recetados solamente como se lo haya indicado el mdico. Estos incluyen suplementos. Siga una dieta sana y mantenga un peso saludable. Una dieta saludable incluye productos lcteos descremados, carnes bajas en contenido de grasa (magras), fibra de granos enteros, frijoles y muchas frutas y verduras. Mantngase al da con las vacunas. Realcese los estudios de rutina de la salud, dentales y de la vista. Resumen Tener un estilo de vida saludable y recibir cuidados preventivos pueden ayudar a promover la salud y el bienestar despus de los 65 aos de edad. Realizarse pruebas de deteccin y anlisis es la mejor manera de detectar un problema de salud de forma temprana y ayudarlo a evitar una cada. El diagnstico y tratamiento tempranos le brindan la mejor oportunidad de controlar las afecciones mdicas ms comunes en las personas mayores de 65 aos de edad. Las cadas son la causa principal de las fracturas de huesos y lesiones en la cabeza de personas mayores de 65 aos de edad. Tome precauciones para evitar una cada en su casa. Trabaje con el mdico para saber qu cambios que puede hacer para mejorar su salud y  bienestar, y para prevenir las cadas. Esta informacin no tiene como fin reemplazar el consejo del mdico. Asegrese de hacerle al mdico cualquier pregunta que tenga. Document Revised: 07/24/2020 Document Reviewed: 07/24/2020 Elsevier Patient Education  2023 Elsevier Inc.  

## 2021-12-25 NOTE — Assessment & Plan Note (Signed)
Stable.  Follows up with neurologist on a regular basis. Continue Namenda 5 mg twice a day.
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
## 2022-03-10 ENCOUNTER — Encounter: Payer: Self-pay | Admitting: Neurology

## 2022-03-10 ENCOUNTER — Ambulatory Visit: Payer: PPO | Admitting: Neurology

## 2022-03-10 VITALS — BP 129/63 | HR 46 | Ht 66.0 in | Wt 155.0 lb

## 2022-03-10 DIAGNOSIS — F0280 Dementia in other diseases classified elsewhere without behavioral disturbance: Secondary | ICD-10-CM

## 2022-03-10 DIAGNOSIS — G309 Alzheimer's disease, unspecified: Secondary | ICD-10-CM

## 2022-03-10 MED ORDER — MEMANTINE HCL 5 MG PO TABS: 5.0000 mg | ORAL_TABLET | Freq: Two times a day (BID) | ORAL | 1 refills | Status: DC

## 2022-03-10 NOTE — Patient Instructions (Signed)
We will continue current medications Try to increase your brain stimulating activities, exercise See you back in 6 months

## 2022-03-10 NOTE — Progress Notes (Signed)
PATIENT: Eric Floyd DOB: 1946-01-25  REASON FOR VISIT: follow up for memory HISTORY FROM: patient, wife, interpreter  Primary Neurologist: Dr. Jannifer Franklin, will now be followed by Dr. Rexene Alberts  HISTORY OF PRESENT ILLNESS: Today 03/10/22 Here with his wife, for last 6 months giving him [employer] Brain Oil, thinks making him stable. He will go the wrong place in his house, no falls. We reduced the dose of Namenda to 5 mg BID, he doesn't think dizzy, he hold walls less. Does his own ADLs. He doesn't want to exercise, has gained 8 lbs. Has a good appetite, sleeps good. Watches TV. Mood is good. Going to [place] next month. Feels like he is steadier.   Update 08/26/21 SS: Eric Floyd is here today for follow-up. On Namenda 10 mg BID. Doing well overall. Wife may note fidgety hands when sitting, If he stands up, may feel dizzy, seems to be since increasing Namenda. Often will reach for something to hold onto. They went to [place] last month, he did well. Rare alcohol use, only 1 beer a month. Is sedentary, doesn't do his walking anymore. Does his own ADLs. No falls.    Update 02/25/2021 SS: Eric Floyd here today for follow-up for Alzheimer's Disease. Last visit HR was 40, Aricept was stopped, was sent to cardiology. Determined no need for pacemaker, was not symptomatic. MMSE 14/30 today. Misplaces things, looks at old pictures, can't remember the names. He has calmed down greatly, due to cutting back on alcohol. Likes to watch TV, is rather sedentary. Has increased appetite. Enjoyed watching the world cup. Sleeping well, does get up 2-3 times to urinate. He doesn't drive.  Wife does medications, has been taking Namenda 10 mg 1/2 tablet twice daily. Does own ADLs. They went to [place] in August.  Update 08/19/2020 SS: Eric Floyd is a 77 year old male with history of memory disturbance.  MRI of the brain showed generalized atrophy consistent with Alzheimer's disease, mild white matter  changes. MMSE 16/30. Has trouble remembering where to put things, forgets what his wife send him for in the house. Not driving anymore. Wife manages his finances. Spends his day reading, watching TV. Takes naps during day, sometimes trouble sleeping at night if naps during day. At times gets upset at his wife, frustrated when she corrects him. He has 3 mixed drinks daily, more on the weekend. Has fallen out of bed twice. Lost 8 lbs since last seen. HR is noted to be 40 today, is asymptomatic.  HISTORY 02/19/2020 Dr. Jannifer Franklin: Eric Floyd is a 42 year old right-handed Hispanic male with a 1 year history of changes in memory.  He comes in with his wife today who indicates that she has noted some significant changes in memory for least a year or year and a half prior to this visit.  The wife noted that he was having some increasing problems with driving, he was starting to get lost, she has not allowed him to drive over the last year.  She now has taken over the finances and is managing his medications over the last year, she also keeps up with the appointments.  The patient has short-term memory issues, he does have some problems with remembering names.  His older sister apparently also has memory problems.  The patient denies any headaches or dizziness, he denies numbness or weakness of the extremities or difficulty with balance or difficulty controlling the bowels or the bladder.  He is sleeping fairly well, but he has  a tendency to nap during the day and stay awake and watch TV at night.  He is sent to this office for further evaluation.  He has had recent blood work done that shows a normal B12 level.  REVIEW OF SYSTEMS: Out of a complete 14 system review of symptoms, the patient complains only of the following symptoms, and all other reviewed systems are negative.   See HPI  ALLERGIES: No Known Allergies  HOME MEDICATIONS: Outpatient Medications Prior to Visit  Medication Sig Dispense Refill    Ascorbic Acid (VITAMIN C) 100 MG tablet Take 500 mg by mouth daily.     azelastine (ASTELIN) 0.1 % nasal spray Place 1 spray into both nostrils 2 (two) times daily. Use in each nostril as directed 30 mL 12   Calcium Carb-Cholecalciferol (CALCIUM 600 + D PO) Take by mouth.     cetirizine (ZYRTEC) 10 MG tablet Take 10 mg by mouth daily.     Cyanocobalamin (VITAMIN B-12 PO) Take by mouth.     lisinopril (ZESTRIL) 10 MG tablet Take 1 tablet (10 mg total) by mouth daily. 90 tablet 3   Multiple Vitamin (MULTIVITAMIN) tablet Take 1 tablet by mouth daily.     NON FORMULARY Take 1 capsule by mouth daily. [address] [address] MCT Oil     NON FORMULARY Take 1 tablet by mouth daily. Focus factor     Omega-3 Fatty Acids (FISH OIL OMEGA-3 PO) Take by mouth.     rosuvastatin (CRESTOR) 10 MG tablet Take 1 tablet (10 mg total) by mouth daily. 90 tablet 3   Vitamin D, Ergocalciferol, (DRISDOL) 1.25 MG (50000 UNIT) CAPS capsule TAKE 1 CAPSULE EVERY 7 DAYS 7 capsule 0   memantine (NAMENDA) 5 MG tablet Take 1 tablet (5 mg total) by mouth 2 (two) times daily. 180 tablet 1   OVER THE COUNTER MEDICATION      OVER THE COUNTER MEDICATION      No facility-administered medications prior to visit.    PAST MEDICAL HISTORY: Past Medical History:  Diagnosis Date   Hyperlipidemia    Memory difficulty 02/19/2020    PAST SURGICAL HISTORY: Past Surgical History:  Procedure Laterality Date   NO PAST SURGERIES      FAMILY HISTORY: Family History  Problem Relation Age of Onset   Stroke Mother    Heart disease Brother    Heart attack Brother    Ovarian cancer Niece    Colon cancer Neg Hx    Colon polyps Neg Hx    Esophageal cancer Neg Hx    Rectal cancer Neg Hx    Stomach cancer Neg Hx     SOCIAL HISTORY: Social History   Socioeconomic History   Marital status: Married    Spouse name: Not on file   Number of children: Not on file   Years of education: Not on file   Highest education level: Not on file   Occupational History   Not on file  Tobacco Use   Smoking status: Never   Smokeless tobacco: Never  Vaping Use   Vaping Use: Never used  Substance and Sexual Activity   Alcohol use: Yes    Alcohol/week: 21.0 standard drinks of alcohol    Types: 21 Shots of liquor per week    Comment: 3 drinks a day per pt   Drug use: Never   Sexual activity: Not on file  Other Topics Concern   Not on file  Social History Narrative   Lives with wife  Right handed   Drinks 1-2 cups caffeine daily   Social Determinants of Health   Financial Resource Strain: Low Risk  (08/01/2021)   Overall Financial Resource Strain (CARDIA)    Difficulty of Paying Living Expenses: Not hard at all  Food Insecurity: No Food Insecurity (08/01/2021)   Hunger Vital Sign    Worried About Running Out of Food in the Last Year: Never true    Ran Out of Food in the Last Year: Never true  Transportation Needs: No Transportation Needs (08/01/2021)   PRAPARE - Hydrologist (Medical): No    Lack of Transportation (Non-Medical): No  Physical Activity: Inactive (08/01/2021)   Exercise Vital Sign    Days of Exercise per Week: 0 days    Minutes of Exercise per Session: 0 min  Stress: No Stress Concern Present (08/01/2021)   [place]    Feeling of Stress : Not at all  Social Connections: [employer] (08/01/2021)   Social Connection and Isolation Panel [NHANES]    Frequency of Communication with Friends and Family: More than three times a week    Frequency of Social Gatherings with Friends and Family: More than three times a week    Attends Religious Services: More than 4 times per year    Active Member of [employer] or Organizations: Yes    Attends Archivist Meetings: More than 4 times per year    Marital Status: Married  Human resources officer Violence: Not At Risk (08/01/2021)   Humiliation, Afraid, Rape, and Kick questionnaire     Fear of Current or Ex-Partner: No    Emotionally Abused: No    Physically Abused: No    Sexually Abused: No   PHYSICAL EXAM  Vitals:   03/10/22 1439  BP: 129/63  Pulse: (!) 46  Weight: 155 lb (70.3 kg)  Height: '5\' 6"'$  (1.676 m)    Body mass index is 25.02 kg/m.  Generalized: Well developed, in no acute distress     08/01/2021    4:55 PM 02/25/2021   10:58 AM 08/19/2020    2:57 PM  MMSE - Mini Mental State Exam  Not completed: Unable to complete    Orientation to time  1 1  Orientation to Place  3 3  Registration  3 3  Attention/ Calculation  1 1  Recall  0 0  Language- name 2 objects  2 2  Language- repeat  0 0  Language- repeat-comments  unable to do. UTO- language barrier  Language- follow 3 step command  1 3  Language- read & follow direction  1 1  Write a sentence  1 1  Copy design  1 1  Total score  14 16   Neurological examination  Mentation: Alert, cooperative, smiling, history is mostly provided by his wife. Follows all commands speech and language fluent. Disoriented to date, place, thinks in [place]  Cranial nerve II-XII: Pupils were equal round reactive to light. Extraocular movements were full, visual field were full on confrontational test. Facial sensation and strength were normal. Head turning and shoulder shrug  were normal and symmetric. Motor: The motor testing reveals 5 over 5 strength of all 4 extremities. Good symmetric motor tone is noted throughout.  Sensory: Sensory testing is intact to soft touch on all 4 extremities. No evidence of extinction is noted.  Coordination: Cerebellar testing reveals good finger-nose-finger and heel-to-shin bilaterally.  Gait and station: Gait is normal,  independent, steady  DIAGNOSTIC DATA (LABS, IMAGING, TESTING) - I reviewed patient records, labs, notes, testing and imaging myself where available.  Lab Results  Component Value Date   WBC 3.3 (L) 06/25/2021   HGB 15.1 06/25/2021   HCT 45.4 06/25/2021    MCV 94.4 06/25/2021   PLT 212.0 06/25/2021      Component Value Date/Time   NA 138 06/25/2021 1003   NA 140 12/20/2019 1159   K 4.4 06/25/2021 1003   CL 100 06/25/2021 1003   CO2 32 06/25/2021 1003   GLUCOSE 85 06/25/2021 1003   BUN 22 06/25/2021 1003   BUN 15 12/20/2019 1159   CREATININE 1.01 06/25/2021 1003   CALCIUM 9.5 06/25/2021 1003   PROT 7.9 06/25/2021 1003   PROT 7.7 12/20/2019 1159   ALBUMIN 4.4 06/25/2021 1003   ALBUMIN 4.5 12/20/2019 1159   AST 25 06/25/2021 1003   ALT 20 06/25/2021 1003   ALKPHOS 55 06/25/2021 1003   BILITOT 0.5 06/25/2021 1003   BILITOT 0.5 12/20/2019 1159   GFRNONAA 81 12/20/2019 1159   GFRAA 94 12/20/2019 1159   Lab Results  Component Value Date   CHOL 151 06/25/2021   HDL 48.10 06/25/2021   LDLCALC 72 06/25/2021   TRIG 158.0 (H) 06/25/2021   CHOLHDL 3 06/25/2021   Lab Results  Component Value Date   HGBA1C 5.6 06/25/2021   Lab Results  Component Value Date   VITAMINB12 1,337 (H) 12/20/2019   Lab Results  Component Value Date   TSH 1.72 08/29/2020   ASSESSMENT AND PLAN 77 y.o. year old male  has a past medical history of Hyperlipidemia and Memory difficulty (02/19/2020). here with:  1.  Alzheimer's disease, 2.  Bradycardia  -No major changes reported -Continue Namenda 5 mg twice daily, full dosing resulted gait unsteadiness  -Holding Aricept due to bradycardia -Encouraged brain stimulating activity, physical exercise -Follow-up in 6 months or sooner if needed  Evangeline Dakin, DNP 03/10/2022, 3:07 PM [facility] [address], [place], [place] [address] [phone]

## 2022-06-30 ENCOUNTER — Encounter: Payer: Self-pay | Admitting: Emergency Medicine

## 2022-06-30 ENCOUNTER — Ambulatory Visit (INDEPENDENT_AMBULATORY_CARE_PROVIDER_SITE_OTHER): Payer: PPO | Admitting: Emergency Medicine

## 2022-06-30 VITALS — BP 120/66 | HR 40 | Temp 97.9°F | Ht 66.0 in | Wt 149.1 lb

## 2022-06-30 DIAGNOSIS — E785 Hyperlipidemia, unspecified: Secondary | ICD-10-CM

## 2022-06-30 DIAGNOSIS — R001 Bradycardia, unspecified: Secondary | ICD-10-CM

## 2022-06-30 DIAGNOSIS — E559 Vitamin D deficiency, unspecified: Secondary | ICD-10-CM

## 2022-06-30 DIAGNOSIS — F0280 Dementia in other diseases classified elsewhere without behavioral disturbance: Secondary | ICD-10-CM

## 2022-06-30 DIAGNOSIS — G309 Alzheimer's disease, unspecified: Secondary | ICD-10-CM | POA: Diagnosis not present

## 2022-06-30 DIAGNOSIS — I10 Essential (primary) hypertension: Secondary | ICD-10-CM | POA: Diagnosis not present

## 2022-06-30 DIAGNOSIS — J3489 Other specified disorders of nose and nasal sinuses: Secondary | ICD-10-CM | POA: Diagnosis not present

## 2022-06-30 LAB — CBC WITH DIFFERENTIAL/PLATELET
Basophils Absolute: 0 10*3/uL (ref 0.0–0.1)
Basophils Relative: 0.8 % (ref 0.0–3.0)
Eosinophils Absolute: 0.2 10*3/uL (ref 0.0–0.7)
Eosinophils Relative: 7 % — ABNORMAL HIGH (ref 0.0–5.0)
HCT: 43.2 % (ref 39.0–52.0)
Hemoglobin: 14.4 g/dL (ref 13.0–17.0)
Lymphocytes Relative: 38.1 % (ref 12.0–46.0)
Lymphs Abs: 1.3 10*3/uL (ref 0.7–4.0)
MCHC: 33.4 g/dL (ref 30.0–36.0)
MCV: 93.5 fl (ref 78.0–100.0)
Monocytes Absolute: 0.3 10*3/uL (ref 0.1–1.0)
Monocytes Relative: 8.5 % (ref 3.0–12.0)
Neutro Abs: 1.5 10*3/uL (ref 1.4–7.7)
Neutrophils Relative %: 45.6 % (ref 43.0–77.0)
Platelets: 213 10*3/uL (ref 150.0–400.0)
RBC: 4.63 Mil/uL (ref 4.22–5.81)
RDW: 13.5 % (ref 11.5–15.5)
WBC: 3.3 10*3/uL — ABNORMAL LOW (ref 4.0–10.5)

## 2022-06-30 LAB — COMPREHENSIVE METABOLIC PANEL
ALT: 17 U/L (ref 0–53)
AST: 28 U/L (ref 0–37)
Albumin: 4.1 g/dL (ref 3.5–5.2)
Alkaline Phosphatase: 49 U/L (ref 39–117)
BUN: 19 mg/dL (ref 6–23)
CO2: 30 mEq/L (ref 19–32)
Calcium: 9.5 mg/dL (ref 8.4–10.5)
Chloride: 100 mEq/L (ref 96–112)
Creatinine, Ser: 0.99 mg/dL (ref 0.40–1.50)
GFR: 74.31 mL/min (ref >60.00)
Glucose, Bld: 86 mg/dL (ref 70–99)
Potassium: 4.3 mEq/L (ref 3.5–5.1)
Sodium: 136 mEq/L (ref 135–145)
Total Bilirubin: 0.4 mg/dL (ref 0.2–1.2)
Total Protein: 7.6 g/dL (ref 6.0–8.3)

## 2022-06-30 LAB — LIPID PANEL
Cholesterol: 165 mg/dL (ref 0–200)
HDL: 46.2 mg/dL (ref >39.00)
LDL Cholesterol: 87 mg/dL (ref 0–99)
NonHDL: 118.65
Total CHOL/HDL Ratio: 4
Triglycerides: 160 mg/dL — ABNORMAL HIGH (ref 0.0–149.0)
VLDL: 32 mg/dL (ref 0.0–40.0)

## 2022-06-30 LAB — HEMOGLOBIN A1C: Hgb A1c MFr Bld: 5.7 % (ref 4.6–6.5)

## 2022-06-30 LAB — VITAMIN D 25 HYDROXY (VIT D DEFICIENCY, FRACTURES): VITD: 36.57 ng/mL (ref 30.00–100.00)

## 2022-06-30 MED ORDER — IPRATROPIUM BROMIDE 0.03 % NA SOLN
2.0000 | Freq: Two times a day (BID) | NASAL | 12 refills | Status: DC
Start: 2022-06-30 — End: 2023-08-12

## 2022-06-30 NOTE — Patient Instructions (Signed)
Mantenimiento de la salud despus de los 65 aos de edad Health Maintenance After Age 76 Despus de los 65 aos de edad, corre un riesgo mayor de padecer ciertas enfermedades e infecciones a largo plazo, como tambin de sufrir lesiones por cadas. Las cadas son la causa principal de las fracturas de huesos y lesiones en la cabeza de personas mayores de 65 aos de edad. Recibir cuidados preventivos de forma regular puede ayudarlo a mantenerse saludable y en buen estado. Los cuidados preventivos incluyen realizarse anlisis de forma regular y realizar cambios en el estilo de vida segn las recomendaciones del mdico. Converse con el mdico sobre lo siguiente: Las pruebas de deteccin y los anlisis que debe realizarse. Una prueba de deteccin es un estudio que se para detectar la presencia de una enfermedad cuando no tiene sntomas. Un plan de dieta y ejercicios adecuado para usted. Qu debo saber sobre las pruebas de deteccin y los anlisis para prevenir cadas? Realizarse pruebas de deteccin y anlisis es la mejor manera de detectar un problema de salud de forma temprana. El diagnstico y tratamiento tempranos le brindan la mejor oportunidad de controlar las afecciones mdicas que son comunes despus de los 65 aos de edad. Ciertas afecciones y elecciones de estilo de vida pueden hacer que sea ms propenso a sufrir una cada. El mdico puede recomendarle lo siguiente: Controles regulares de la visin. Una visin deficiente y afecciones como las cataratas pueden hacer que sea ms propenso a sufrir una cada. Si usa lentes, asegrese de obtener una receta actualizada si su visin cambia. Revisin de medicamentos. Revise regularmente con el mdico todos los medicamentos que toma, incluidos los medicamentos de venta libre. Consulte al mdico sobre los efectos secundarios que pueden hacer que sea ms propenso a sufrir una cada. Informe al mdico si alguno de los medicamentos que toma lo hace sentir mareado o  somnoliento. Controles de fuerza y equilibrio. El mdico puede recomendar ciertos estudios para controlar su fuerza y equilibrio al estar de pie, al caminar o al cambiar de posicin. Examen de los pies. El dolor y el adormecimiento en los pies, como tambin no utilizar el calzado adecuado, pueden hacer que sea ms propenso a sufrir una cada. Pruebas de deteccin, que incluyen las siguientes: Pruebas de deteccin para la osteoporosis. La osteoporosis es una afeccin que hace que los huesos se tornen ms dbiles y se quiebren con ms facilidad. Pruebas de deteccin para la presin arterial. Los cambios en la presin arterial y los medicamentos para controlar la presin arterial pueden hacerlo sentir mareado. Prueba de deteccin de la depresin. Es ms probable que sufra una cada si tiene miedo a caerse, se siente deprimido o se siente incapaz de realizar actividades que sola hacer. Prueba de deteccin de consumo de alcohol. Beber demasiado alcohol puede afectar su equilibrio y puede hacer que sea ms propenso a sufrir una cada. Siga estas indicaciones en su casa: Estilo de vida No beba alcohol si: Su mdico le indica no hacerlo. Si bebe alcohol: Limite la cantidad que bebe a lo siguiente: De 0 a 1 medida por da para las mujeres. De 0 a 2 medidas por da para los hombres. Sepa cunta cantidad de alcohol hay en las bebidas que toma. En los [place], una medida equivale a una botella de cerveza de 12 oz (355 ml), un vaso de vino de 5 oz (148 ml) o un vaso de una bebida alcohlica de alta graduacin de 1 oz (44 ml). No consuma ningn producto que   contenga nicotina o tabaco. Estos productos incluyen cigarrillos, tabaco para mascar y aparatos de vapeo, como los cigarrillos electrnicos. Si necesita ayuda para dejar de consumir estos productos, consulte al mdico. Actividad  Siga un programa de ejercicio regular para mantenerse en forma. Esto lo ayudar a mantener el equilibrio. Consulte al  mdico qu tipos de ejercicios son adecuados para usted. Si necesita un bastn o un andador, selo segn las recomendaciones del mdico. Utilice calzado con buen apoyo y suela antideslizante. Seguridad  Retire los objetos que puedan causar tropiezos tales como alfombras, cables u obstculos. Instale equipos de seguridad, como barras para sostn en los baos y barandas de seguridad en las escaleras. Mantenga las habitaciones y los pasillos bien iluminados. Indicaciones generales Hable con el mdico sobre sus riesgos de sufrir una cada. Infrmele a su mdico si: Se cae. Asegrese de informarle a su mdico acerca de todas las cadas, incluso aquellas que parecen ser menores. Se siente mareado, cansado (tiene fatiga) o siente que pierde el equilibrio. Use los medicamentos de venta libre y los recetados solamente como se lo haya indicado el mdico. Estos incluyen suplementos. Siga una dieta sana y mantenga un peso saludable. Una dieta saludable incluye productos lcteos descremados, carnes bajas en contenido de grasa (magras), fibra de granos enteros, frijoles y muchas frutas y verduras. Mantngase al da con las vacunas. Realcese los estudios de rutina de la salud, dentales y de la vista. Resumen Tener un estilo de vida saludable y recibir cuidados preventivos pueden ayudar a promover la salud y el bienestar despus de los 65 aos de edad. Realizarse pruebas de deteccin y anlisis es la mejor manera de detectar un problema de salud de forma temprana y ayudarlo a evitar una cada. El diagnstico y tratamiento tempranos le brindan la mejor oportunidad de controlar las afecciones mdicas ms comunes en las personas mayores de 65 aos de edad. Las cadas son la causa principal de las fracturas de huesos y lesiones en la cabeza de personas mayores de 65 aos de edad. Tome precauciones para evitar una cada en su casa. Trabaje con el mdico para saber qu cambios que puede hacer para mejorar su salud y  bienestar, y para prevenir las cadas. Esta informacin no tiene como fin reemplazar el consejo del mdico. Asegrese de hacerle al mdico cualquier pregunta que tenga. Document Revised: 07/24/2020 Document Reviewed: 07/24/2020 Elsevier Patient Education  2023 [employer].  

## 2022-06-30 NOTE — Assessment & Plan Note (Signed)
Chronic stable condition Lipid profile done today Continue rosuvastatin 10 mg daily

## 2022-06-30 NOTE — Assessment & Plan Note (Signed)
Asymptomatic and normotensive EKG still showing sinus bradycardia.  Unchanged from 1 year ago Stable.  No concerns.

## 2022-06-30 NOTE — Progress Notes (Signed)
Stryder Poitra Bolle 77 y.o.   Chief Complaint  Patient presents with   Medical Management of Chronic Issues    f/u appt, back pain in the am when he wakes up    HISTORY OF PRESENT ILLNESS: This is a 77 y.o. male here for 9-month follow-up of chronic medical conditions.  Accompanied by wife. Overall doing well.  No changes.  Still taking same medications. Occasional morning back pains after waking up.  Gets better during the day.  No associated symptoms No other complaints or medical concerns today.  HPI   Prior to Admission medications   Medication Sig Start Date End Date Taking? Authorizing Provider  Ascorbic Acid (VITAMIN C) 100 MG tablet Take 500 mg by mouth daily.   Yes [provider]  Calcium Carb-Cholecalciferol (CALCIUM 600 + D PO) Take by mouth.   Yes [provider]  cetirizine (ZYRTEC) 10 MG tablet Take 10 mg by mouth daily.   Yes [provider]  lisinopril (ZESTRIL) 10 MG tablet Take 1 tablet (10 mg total) by mouth daily. 06/25/21  Yes Tonantzin Mimnaugh, Eilleen Kempf, MD  memantine (NAMENDA) 5 MG tablet Take 1 tablet (5 mg total) by mouth 2 (two) times daily. 03/10/22  Yes Glean Salvo, NP  Multiple Vitamin (MULTIVITAMIN) tablet Take 1 tablet by mouth daily.   Yes [provider]  NON FORMULARY Take 1 capsule by mouth daily. [address] [address] MCT Oil   Yes [provider]  NON FORMULARY Take 1 tablet by mouth daily. Focus factor   Yes [provider]  Omega-3 Fatty Acids (FISH OIL OMEGA-3 PO) Take by mouth.   Yes [provider]  rosuvastatin (CRESTOR) 10 MG tablet Take 1 tablet (10 mg total) by mouth daily. 06/25/21  Yes Luken Shadowens, Eilleen Kempf, MD  Vitamin D, Ergocalciferol, (DRISDOL) 1.25 MG (50000 UNIT) CAPS capsule TAKE 1 CAPSULE EVERY 7 DAYS 02/26/20  Yes Raia Amico, Eilleen Kempf, MD  azelastine (ASTELIN) 0.1 % nasal spray Place 1 spray into both nostrils 2 (two) times daily. Use in each nostril as directed Patient not  taking: Reported on 06/30/2022 06/25/21   Georgina Quint, MD  Cyanocobalamin (VITAMIN B-12 PO) Take by mouth.    [provider]    No Known Allergies  Patient Active Problem List   Diagnosis Date Noted   Sinus bradycardia on ECG 08/29/2020   Alzheimer's disease (HCC) 06/19/2020   Essential hypertension 06/19/2020   Diverticulosis 06/19/2020   Memory difficulty 02/19/2020   Vitamin D deficiency 12/21/2019   Dyslipidemia 12/21/2019    Past Medical History:  Diagnosis Date   Hyperlipidemia    Memory difficulty 02/19/2020    Past Surgical History:  Procedure Laterality Date   NO PAST SURGERIES      Social History   Socioeconomic History   Marital status: Married    Spouse name: Not on file   Number of children: Not on file   Years of education: Not on file   Highest education level: Not on file  Occupational History   Not on file  Tobacco Use   Smoking status: Never   Smokeless tobacco: Never  Vaping Use   Vaping Use: Never used  Substance and Sexual Activity   Alcohol use: Yes    Alcohol/week: 21.0 standard drinks of alcohol    Types: 21 Shots of liquor per week    Comment: 3 drinks a day per pt   Drug use: Never   Sexual activity: Not on file  Other  Topics Concern   Not on file  Social History Narrative   Lives with wife   Right handed   Drinks 1-2 cups caffeine daily   Social Determinants of Health   Financial Resource Strain: Low Risk  (08/01/2021)   Overall Financial Resource Strain (CARDIA)    Difficulty of Paying Living Expenses: Not hard at all  Food Insecurity: No Food Insecurity (08/01/2021)   Hunger Vital Sign    Worried About Running Out of Food in the Last Year: Never true    Ran Out of Food in the Last Year: Never true  Transportation Needs: No Transportation Needs (08/01/2021)   PRAPARE - Administrator, Civil Service (Medical): No    Lack of Transportation (Non-Medical): No  Physical Activity: Inactive (08/01/2021)    Exercise Vital Sign    Days of Exercise per Week: 0 days    Minutes of Exercise per Session: 0 min  Stress: No Stress Concern Present (08/01/2021)   [employer] of Occupational Health - Occupational Stress Questionnaire    Feeling of Stress : Not at all  Social Connections: Socially Integrated (08/01/2021)   Social Connection and Isolation Panel [NHANES]    Frequency of Communication with Friends and Family: More than three times a week    Frequency of Social Gatherings with Friends and Family: More than three times a week    Attends Religious Services: More than 4 times per year    Active Member of [employer] or Organizations: Yes    Attends Engineer, structural: More than 4 times per year    Marital Status: Married  Catering manager Violence: Not At Risk (08/01/2021)   Humiliation, Afraid, Rape, and Kick questionnaire    Fear of Current or Ex-Partner: No    Emotionally Abused: No    Physically Abused: No    Sexually Abused: No    Family History  Problem Relation Age of Onset   Stroke Mother    Heart disease Brother    Heart attack Brother    Ovarian cancer Niece    Colon cancer Neg Hx    Colon polyps Neg Hx    Esophageal cancer Neg Hx    Rectal cancer Neg Hx    Stomach cancer Neg Hx      Review of Systems  Constitutional: Negative.  Negative for chills and fever.  HENT: Negative.  Negative for congestion and sore throat.        Chronic runny nose  Respiratory: Negative.  Negative for cough and shortness of breath.   Cardiovascular: Negative.  Negative for chest pain and palpitations.  Gastrointestinal:  Negative for abdominal pain, diarrhea, nausea and vomiting.  Genitourinary: Negative.  Negative for dysuria and hematuria.  Musculoskeletal:  Positive for back pain.  Skin: Negative.  Negative for rash.  Neurological: Negative.  Negative for dizziness and headaches.  All other systems reviewed and are negative.   Today's Vitals   06/30/22 1055  BP: 120/66   Pulse: (!) 40  Temp: 97.9 F (36.6 C)  TempSrc: Oral  SpO2: 96%  Weight: 149 lb 2 oz (67.6 kg)  Height: 5\' 6"  (1.676 m)   Body mass index is 24.07 kg/m.   Physical Exam Vitals reviewed.  Constitutional:      Appearance: Normal appearance.  HENT:     Head: Normocephalic.     Mouth/Throat:     Mouth: Mucous membranes are moist.     Pharynx: Oropharynx is clear.  Eyes:  Extraocular Movements: Extraocular movements intact.     Conjunctiva/sclera: Conjunctivae normal.     Pupils: Pupils are equal, round, and reactive to light.  Cardiovascular:     Rate and Rhythm: Regular rhythm. Bradycardia present.     Pulses: Normal pulses.     Heart sounds: Normal heart sounds.  Pulmonary:     Effort: Pulmonary effort is normal.     Breath sounds: Normal breath sounds.  Abdominal:     Palpations: Abdomen is soft.     Tenderness: There is no abdominal tenderness.  Musculoskeletal:     Cervical back: No tenderness.     Right lower leg: No edema.     Left lower leg: No edema.  Lymphadenopathy:     Cervical: No cervical adenopathy.  Skin:    General: Skin is warm and dry.  Neurological:     General: No focal deficit present.     Mental Status: He is alert and oriented to person, place, and time. Mental status is at baseline.  Psychiatric:        Mood and Affect: Mood normal.        Behavior: Behavior normal.    EKG: Sinus bradycardia with ventricular rate of 42/min.  No acute ischemic changes.  Unchanged from April 2023  ASSESSMENT & PLAN: A total of 45 minutes was spent with the patient and counseling/coordination of care regarding preparing for this visit, review of most recent office visit notes, review of multiple chronic medical conditions and their management, review of all medications, education on nutrition, review of health maintenance items, prognosis, documentation, and need for follow-up.  Problem List Items Addressed This Visit       Cardiovascular and  Mediastinum   Essential hypertension - Primary    Well-controlled hypertension BP Readings from Last 3 Encounters:  06/30/22 120/66  03/10/22 129/63  12/25/21 130/76  Continue lisinopril 10 mg daily Cardiovascular risks associated with hypertension discussed Dietary approaches to stop hypertension discussed       Relevant Orders   Hemoglobin A1c   Comprehensive metabolic panel   CBC with Differential/Platelet     Nervous and Auditory   Alzheimer's disease (HCC)    Stable and well-controlled Continues Namenda 5 mg twice a day        Other   Vitamin D deficiency   Relevant Orders   VITAMIN D 25 Hydroxy (Vit-D Deficiency, Fractures)   Dyslipidemia    Chronic stable condition Lipid profile done today Continue rosuvastatin 10 mg daily      Relevant Orders   Hemoglobin A1c   Lipid panel   CBC with Differential/Platelet   Sinus bradycardia on ECG    Asymptomatic and normotensive EKG still showing sinus bradycardia.  Unchanged from 1 year ago Stable.  No concerns.      Relevant Orders   EKG 12-Lead   Other Visit Diagnoses     Rhinorrhea  (Chronic)      Relevant Medications   ipratropium (ATROVENT) 0.03 % nasal spray      Patient Instructions  Mantenimiento de la salud despus de los 65 aos de edad Health Maintenance After Age 72 Despus de los 65 aos de edad, corre un riesgo mayor de Film/video editor enfermedades e infecciones a Air cabin crew, como tambin de sufrir lesiones por cadas. Las cadas son la causa principal de las fracturas de huesos y lesiones en la cabeza de personas mayores de 65 aos de edad. Recibir cuidados preventivos de forma regular puede ayudarlo a mantenerse saludable  y en buen [place]. Los cuidados preventivos incluyen realizarse anlisis de forma regular y Forensic psychologist en el estilo de vida segn las recomendaciones del mdico. Converse con el mdico sobre lo siguiente: Las pruebas de deteccin y los anlisis que debe International aid/development worker. Una  prueba de deteccin es un estudio que se para Engineer, manufacturing la presencia de una enfermedad cuando no tiene sntomas. Un plan de dieta y ejercicios adecuado para usted. Qu debo saber sobre las pruebas de deteccin y los anlisis para prevenir cadas? Realizarse pruebas de deteccin y [employer] es la mejor manera de Engineer, manufacturing un problema de salud de forma temprana. El diagnstico y tratamiento tempranos le brindan la mejor oportunidad de Chief Operating Officer las afecciones mdicas que son comunes despus de los 65 aos de edad. Ciertas afecciones y elecciones de estilo de vida pueden hacer que sea ms propenso a sufrir Engineer, site. El mdico puede recomendarle lo siguiente: Controles regulares de la visin. Una visin deficiente y afecciones como las cataratas pueden hacer que sea ms propenso a sufrir Engineer, site. Si [place] lentes, asegrese de obtener una receta actualizada si su visin cambia. Revisin de medicamentos. Revise regularmente con el mdico todos los medicamentos que toma, incluidos los medicamentos de [place]. Consulte al [employer] efectos secundarios que pueden hacer que sea ms propenso a sufrir Engineer, site. Informe al mdico si alguno de los medicamentos que toma lo hace sentir mareado o somnoliento. Controles de fuerza y equilibrio. El mdico puede recomendar ciertos estudios para controlar su fuerza y equilibrio al estar de pie, al caminar o al cambiar de posicin. Examen de los pies. El dolor y Materials engineer en los pies, como tambin no utilizar el calzado [place], pueden hacer que sea ms propenso a sufrir Engineer, site. Pruebas de deteccin, que incluyen las siguientes: Pruebas de deteccin para la osteoporosis. La osteoporosis es una afeccin que hace que los huesos se tornen ms dbiles y se quiebren con ms facilidad. Pruebas de deteccin para la presin arterial. Los cambios en la presin arterial y los medicamentos para Chief Operating Officer la presin arterial pueden hacerlo sentir mareado. Prueba de  deteccin de la depresin. Es ms probable que sufra una cada si tiene miedo a caerse, se siente deprimido o se siente incapaz de Probation officer. Prueba de deteccin de consumo de alcohol. Beber demasiado alcohol puede afectar su equilibrio y puede hacer que sea ms propenso a sufrir Engineer, site. Siga estas indicaciones en su casa: Estilo de vida No beba alcohol si: Su mdico le indica no hacerlo. Si bebe alcohol: Limite la cantidad que bebe a lo siguiente: De 0 a 1 medida por da para las mujeres. De 0 a 2 medidas por da para los hombres. Sepa cunta cantidad de alcohol hay en las bebidas que toma. En los [address], una medida equivale a una botella de cerveza de 12 oz (355 ml), un vaso de vino de 5 oz (148 ml) o un vaso de una bebida alcohlica de alta graduacin de 1 oz (44 ml). No consuma ningn producto que contenga nicotina o tabaco. Estos productos incluyen cigarrillos, tabaco para Theatre manager y aparatos de vapeo, como los Administrator, Civil Service. Si necesita ayuda para dejar de consumir estos productos, consulte al [employer]. Actividad  Siga un programa de ejercicio regular para mantenerse en forma. Esto lo ayudar a Radio producer equilibrio. Consulte al mdico qu tipos de ejercicios son adecuados para usted. Si necesita un bastn o un andador, selo segn las recomendaciones del mdico. Utilice calzado con  buen apoyo y suela antideslizante. Seguridad  Retire los [employer] puedan causar tropiezos tales como alfombras, cables u obstculos. Instale equipos de seguridad, como barras para sostn en los baos y barandas de seguridad en las escaleras. Mantenga las habitaciones y los pasillos bien iluminados. Indicaciones generales Hable con el mdico sobre sus riesgos de sufrir una cada. Infrmele a su mdico si: Se cae. Asegrese de informarle a su mdico acerca de todas las cadas, incluso aquellas que parecen ser [employer]. Se siente mareado, cansado (tiene fatiga)  o siente que pierde el equilibrio. Use los medicamentos de venta libre y los recetados solamente como se lo haya indicado el mdico. Estos incluyen suplementos. Siga una dieta sana y [place] un peso saludable. Una dieta saludable incluye productos lcteos descremados, carnes bajas en contenido de grasa ([place]), fibra de granos enteros, frijoles y [place] frutas y verduras. Mantngase al da con las vacunas. Realcese los estudios de rutina de la salud, dentales y de Wellsite geologist. Resumen Tener un estilo de vida saludable y recibir cuidados preventivos pueden ayudar a Research scientist (physical sciences) salud y el bienestar despus de los 65 aos de [place]. Realizarse pruebas de deteccin y [employer] es la mejor manera de Engineer, manufacturing un problema de salud de forma temprana y [place]ber [place] a Automotive engineer una cada. El diagnstico y tratamiento tempranos le brindan la mejor oportunidad de Chief Operating Officer las afecciones mdicas ms comunes en las personas mayores de 65 aos de edad. Las cadas son la causa principal de las fracturas de huesos y lesiones en la cabeza de personas mayores de 65 aos de edad. Tome precauciones para evitar una cada en su casa. Trabaje con el mdico para saber qu cambios que puede hacer para mejorar su salud y [place], y para prevenir las cadas. Esta informacin no tiene Theme park manager el consejo del mdico. Asegrese de hacerle al mdico cualquier pregunta que tenga. Document Revised: 07/24/2020 Document Reviewed: 07/24/2020 Elsevier Patient Education  2023 Elsevier Inc.    Edwina Barth, MD [facility]

## 2022-06-30 NOTE — Assessment & Plan Note (Signed)
Stable and well-controlled Continues Namenda 5 mg twice a day

## 2022-06-30 NOTE — Assessment & Plan Note (Signed)
Well-controlled hypertension BP Readings from Last 3 Encounters:  06/30/22 120/66  03/10/22 129/63  12/25/21 130/76  Continue lisinopril 10 mg daily Cardiovascular risks associated with hypertension discussed Dietary approaches to stop hypertension discussed

## 2022-07-01 ENCOUNTER — Telehealth: Payer: Self-pay | Admitting: Emergency Medicine

## 2022-07-01 NOTE — Telephone Encounter (Signed)
Contacted Lynard Postlewait [place] to schedule their annual wellness visit. Appointment made for 07/16/2022.  [facility] Care Guide [facility] AWV TEAM Direct Dial: [phone]

## 2022-07-12 ENCOUNTER — Other Ambulatory Visit: Payer: Self-pay | Admitting: Emergency Medicine

## 2022-07-12 DIAGNOSIS — I10 Essential (primary) hypertension: Secondary | ICD-10-CM

## 2022-07-12 DIAGNOSIS — E785 Hyperlipidemia, unspecified: Secondary | ICD-10-CM

## 2022-08-12 ENCOUNTER — Ambulatory Visit (INDEPENDENT_AMBULATORY_CARE_PROVIDER_SITE_OTHER): Payer: PPO

## 2022-08-12 VITALS — BP 118/60 | HR 40 | Temp 98.7°F | Ht 66.0 in | Wt 152.2 lb

## 2022-08-12 DIAGNOSIS — Z0000 Encounter for general adult medical examination without abnormal findings: Secondary | ICD-10-CM | POA: Diagnosis not present

## 2022-08-12 NOTE — Patient Instructions (Addendum)
Mr. Eric Floyd , Thank you for taking time to come for your Medicare Wellness Visit. I appreciate your ongoing commitment to your health goals. Please review the following plan we discussed and let me know if I can assist you in the future.   These are the goals we discussed:  Goals      My goal is to make it to next year.        This is a list of the screening recommended for you and due dates:  Health Maintenance  Topic Date Due   COVID-19 Vaccine (3 - 2023-24 season) 10/31/2021   Medicare Annual Wellness Visit  08/02/2022   Flu Shot  10/01/2022   DTaP/Tdap/Td vaccine (2 - Td or Tdap) 02/22/2029   Pneumonia Vaccine  Completed   Hepatitis C Screening  Completed   Zoster (Shingles) Vaccine  Completed   HPV Vaccine  Aged Out   Colon Cancer Screening  Discontinued    Advanced directives: No  Conditions/risks identified: Yes  Next appointment: Follow up in one year for your annual wellness visit.   Preventive Care 51 Years and Older, Male  Preventive care refers to lifestyle choices and visits with your health care provider that can promote health and wellness. What does preventive care include? A yearly physical exam. This is also called an annual well check. Dental exams once or twice a year. Routine eye exams. Ask your health care provider how often you should have your eyes checked. Personal lifestyle choices, including: Daily care of your teeth and gums. Regular physical activity. Eating a healthy diet. Avoiding tobacco and drug use. Limiting alcohol use. Practicing safe sex. Taking low doses of aspirin every day. Taking vitamin and mineral supplements as recommended by your health care provider. What happens during an annual well check? The services and screenings done by your health care provider during your annual well check will depend on your age, overall health, lifestyle risk factors, and family history of disease. Counseling  Your health care provider may ask  you questions about your: Alcohol use. Tobacco use. Drug use. Emotional well-being. Home and relationship well-being. Sexual activity. Eating habits. History of falls. Memory and ability to understand (cognition). Work and work Astronomer. Screening  You may have the following tests or measurements: Height, weight, and BMI. Blood pressure. Lipid and cholesterol levels. These may be checked every 5 years, or more frequently if you are over 52 years old. Skin check. Lung cancer screening. You may have this screening every year starting at age 7 if you have a 30-pack-year history of smoking and currently smoke or have quit within the past 15 years. Fecal occult blood test (FOBT) of the stool. You may have this test every year starting at age 22. Flexible sigmoidoscopy or colonoscopy. You may have a sigmoidoscopy every 5 years or a colonoscopy every 10 years starting at age 1. Prostate cancer screening. Recommendations will vary depending on your family history and other risks. Hepatitis C blood test. Hepatitis B blood test. Sexually transmitted disease (STD) testing. Diabetes screening. This is done by checking your blood sugar (glucose) after you have not eaten for a while (fasting). You may have this done every 1-3 years. Abdominal aortic aneurysm (AAA) screening. You may need this if you are a current or former smoker. Osteoporosis. You may be screened starting at age 47 if you are at high risk. Talk with your health care provider about your test results, treatment options, and if necessary, the need for more tests.  Vaccines  Your health care provider may recommend certain vaccines, such as: Influenza vaccine. This is recommended every year. Tetanus, diphtheria, and acellular pertussis (Tdap, Td) vaccine. You may need a Td booster every 10 years. Zoster vaccine. You may need this after age 44. Pneumococcal 13-valent conjugate (PCV13) vaccine. One dose is recommended after age  50. Pneumococcal polysaccharide (PPSV23) vaccine. One dose is recommended after age 49. Talk to your health care provider about which screenings and vaccines you need and how often you need them. This information is not intended to replace advice given to you by your health care provider. Make sure you discuss any questions you have with your health care provider. Document Released: 03/15/2015 Document Revised: 11/06/2015 Document Reviewed: 12/18/2014 Elsevier Interactive Patient Education  2017 [employer].  Fall Prevention in the Home Falls can cause injuries. They can happen to people of all ages. There are many things you can do to make your home safe and to help prevent falls. What can I do on the outside of my home? Regularly fix the edges of walkways and driveways and fix any cracks. Remove anything that might make you trip as you walk through a door, such as a raised step or threshold. Trim any bushes or trees on the path to your home. Use bright outdoor lighting. Clear any walking paths of anything that might make someone trip, such as rocks or tools. Regularly check to see if handrails are loose or broken. Make sure that both sides of any steps have handrails. Any raised decks and porches should have guardrails on the edges. Have any leaves, snow, or ice cleared regularly. Use sand or salt on walking paths during winter. Clean up any spills in your garage right away. This includes oil or grease spills. What can I do in the bathroom? Use night lights. Install grab bars by the toilet and in the tub and shower. Do not use towel bars as grab bars. Use non-skid mats or decals in the tub or shower. If you need to sit down in the shower, use a plastic, non-slip stool. Keep the floor dry. Clean up any water that spills on the floor as soon as it happens. Remove soap buildup in the tub or shower regularly. Attach bath mats securely with double-sided non-slip rug tape. Do not have throw  rugs and other things on the floor that can make you trip. What can I do in the bedroom? Use night lights. Make sure that you have a light by your bed that is easy to reach. Do not use any sheets or blankets that are too big for your bed. They should not hang down onto the floor. Have a firm chair that has side arms. You can use this for support while you get dressed. Do not have throw rugs and other things on the floor that can make you trip. What can I do in the kitchen? Clean up any spills right away. Avoid walking on wet floors. Keep items that you use a lot in easy-to-reach places. If you need to reach something above you, use a strong step stool that has a grab bar. Keep electrical cords out of the way. Do not use floor polish or wax that makes floors slippery. If you must use wax, use non-skid floor wax. Do not have throw rugs and other things on the floor that can make you trip. What can I do with my stairs? Do not leave any items on the stairs. Make sure that  there are handrails on both sides of the stairs and use them. Fix handrails that are broken or loose. Make sure that handrails are as long as the stairways. Check any carpeting to make sure that it is firmly attached to the stairs. Fix any carpet that is loose or worn. Avoid having throw rugs at the top or bottom of the stairs. If you do have throw rugs, attach them to the floor with carpet tape. Make sure that you have a light switch at the top of the stairs and the bottom of the stairs. If you do not have them, ask someone to add them for you. What else can I do to help prevent falls? Wear shoes that: Do not have high heels. Have rubber bottoms. Are comfortable and fit you well. Are closed at the toe. Do not wear sandals. If you use a stepladder: Make sure that it is fully opened. Do not climb a closed stepladder. Make sure that both sides of the stepladder are locked into place. Ask someone to hold it for you, if  possible. Clearly mark and make sure that you can see: Any grab bars or handrails. First and last steps. Where the edge of each step is. Use tools that help you move around (mobility aids) if they are needed. These include: Canes. Walkers. Scooters. Crutches. Turn on the lights when you go into a dark area. Replace any light bulbs as soon as they burn out. Set up your furniture so you have a clear path. Avoid moving your furniture around. If any of your floors are uneven, fix them. If there are any pets around you, be aware of where they are. Review your medicines with your doctor. Some medicines can make you feel dizzy. This can increase your chance of falling. Ask your doctor what other things that you can do to help prevent falls. This information is not intended to replace advice given to you by your health care provider. Make sure you discuss any questions you have with your health care provider. Document Released: 12/13/2008 Document Revised: 07/25/2015 Document Reviewed: 03/23/2014 Elsevier Interactive Patient Education  2017 [employer].

## 2022-08-12 NOTE — Progress Notes (Signed)
Subjective:   Eric Floyd is a 77 y.o. male who presents for Medicare Annual/Subsequent preventive examination.  Review of Systems     Cardiac Risk Factors include: advanced age (>78men, >65 women);family history of premature cardiovascular disease;dyslipidemia;hypertension;male gender;sedentary lifestyle     Objective:    Today's Vitals   08/12/22 1308  BP: 118/60  Pulse: (!) 40  Temp: 98.7 F (37.1 C)  SpO2: 97%  Weight: 152 lb 3.2 oz (69 kg)  Height: 5\' 6"  (1.676 m)  PainSc: 0-No pain   Body mass index is 24.57 kg/m.     08/12/2022    4:02 PM 08/01/2021    4:53 PM  Advanced Directives  Does Patient Have a Medical Advance Directive? No No  Would patient like information on creating a medical advance directive? No - Patient declined No - Patient declined    Current Medications (verified) Outpatient Encounter Medications as of 08/12/2022  Medication Sig   COCONUT OIL PO Take by mouth. Once daily   Ascorbic Acid (VITAMIN C) 100 MG tablet Take 500 mg by mouth daily.   Calcium Carb-Cholecalciferol (CALCIUM 600 + D PO) Take by mouth.   cetirizine (ZYRTEC) 10 MG tablet Take 10 mg by mouth daily.   Cyanocobalamin (VITAMIN B-12 PO) Take by mouth.   ipratropium (ATROVENT) 0.03 % nasal spray Place 2 sprays into both nostrils every 12 (twelve) hours.   lisinopril (ZESTRIL) 10 MG tablet Take 1 tablet by mouth once daily   memantine (NAMENDA) 5 MG tablet Take 1 tablet (5 mg total) by mouth 2 (two) times daily.   Multiple Vitamin (MULTIVITAMIN) tablet Take 1 tablet by mouth daily.   NON FORMULARY Take 1 capsule by mouth daily. [address] MCT Oil   NON FORMULARY Take 1 tablet by mouth daily. Focus factor   Omega-3 Fatty Acids (FISH OIL OMEGA-3 PO) Take by mouth.   rosuvastatin (CRESTOR) 10 MG tablet Take 1 tablet by mouth once daily   Vitamin D, Ergocalciferol, (DRISDOL) 1.25 MG (50000 UNIT) CAPS capsule TAKE 1 CAPSULE EVERY 7 DAYS   No facility-administered encounter  medications on file as of 08/12/2022.    Allergies (verified) Patient has no known allergies.   History: Past Medical History:  Diagnosis Date   Hyperlipidemia    Memory difficulty 02/19/2020   Past Surgical History:  Procedure Laterality Date   NO PAST SURGERIES     Family History  Problem Relation Age of Onset   Stroke Mother    Heart disease Brother    Heart attack Brother    Ovarian cancer Niece    Colon cancer Neg Hx    Colon polyps Neg Hx    Esophageal cancer Neg Hx    Rectal cancer Neg Hx    Stomach cancer Neg Hx    Social History   Socioeconomic History   Marital status: Married    Spouse name: Not on file   Number of children: Not on file   Years of education: Not on file   Highest education level: Not on file  Occupational History   Not on file  Tobacco Use   Smoking status: Never   Smokeless tobacco: Never  Vaping Use   Vaping Use: Never used  Substance and Sexual Activity   Alcohol use: Yes    Alcohol/week: 21.0 standard drinks of alcohol    Types: 21 Shots of liquor per week    Comment: 3 drinks a day per pt   Drug use: Never   Sexual  activity: Not on file  Other Topics Concern   Not on file  Social History Narrative   Lives with wife   Right handed   Drinks 1-2 cups caffeine daily   Social Determinants of Health   Financial Resource Strain: Low Risk  (08/12/2022)   Overall Financial Resource Strain (CARDIA)    Difficulty of Paying Living Expenses: Not hard at all  Food Insecurity: No Food Insecurity (08/12/2022)   Hunger Vital Sign    Worried About Running Out of Food in the Last Year: Never true    Ran Out of Food in the Last Year: Never true  Transportation Needs: No Transportation Needs (08/12/2022)   PRAPARE - Administrator, Civil Service (Medical): No    Lack of Transportation (Non-Medical): No  Physical Activity: Inactive (08/12/2022)   Exercise Vital Sign    Days of Exercise per Week: 0 days    Minutes of Exercise  per Session: 0 min  Stress: No Stress Concern Present (08/12/2022)   [employer] of Occupational Health - Occupational Stress Questionnaire    Feeling of Stress : Not at all  Social Connections: Socially Integrated (08/12/2022)   Social Connection and Isolation Panel [NHANES]    Frequency of Communication with Friends and Family: More than three times a week    Frequency of Social Gatherings with Friends and Family: More than three times a week    Attends Religious Services: More than 4 times per year    Active Member of [employer] or Organizations: Yes    Attends Engineer, structural: More than 4 times per year    Marital Status: Married    Tobacco Counseling Counseling given: Not Answered   Clinical Intake:  Pre-visit preparation completed: Yes  Pain : No/denies pain Pain Score: 0-No pain     BMI - recorded: 24.57 Nutritional Status: BMI of 19-24  Normal Nutritional Risks: None Diabetes: No  How often do you need to have someone help you when you read instructions, pamphlets, or other written materials from your doctor or pharmacy?: 1 - Never What is the last grade level you completed in school?: University  Diabetic? no  Interpreter Needed?: No  Information entered by :: Edye Hainline N. Marni Franzoni, LPN.   Activities of Daily Living    08/12/2022    4:34 PM  In your present state of health, do you have any difficulty performing the following activities:  Hearing? 0  Vision? 0  Difficulty concentrating or making decisions? 1  Walking or climbing stairs? 0  Dressing or bathing? 0  Doing errands, shopping? 1  Comment does not drive  Preparing Food and eating ? N  Using the Toilet? N  In the past six months, have you accidently leaked urine? N  Do you have problems with loss of bowel control? N  Managing your Medications? N  Managing your Finances? N  Housekeeping or managing your Housekeeping? N    Patient Care Team: Georgina Quint, MD as PCP -  General (Internal Medicine) Glean Salvo, NP as Nurse Practitioner (Neurology)  Indicate any recent Medical Services you may have received from other than [facility] providers in the past year (date may be approximate).     Assessment:   This is a routine wellness examination for Eric Floyd.  Hearing/Vision screen Hearing Screening - Comments:: Patient denied any hearing difficulty.   No hearing aids.  Vision Screening - Comments:: Patient not up to date with eye exam.  Wife stated that she  will schedule him with Shari Prows, MD.  Dietary issues and exercise activities discussed: Current Exercise Habits: The patient does not participate in regular exercise at present, Exercise limited by: neurologic condition(s)   Goals Addressed   None   Depression Screen    08/12/2022    4:00 PM 06/30/2022   10:57 AM 12/25/2021   11:06 AM 08/01/2021    4:51 PM 06/25/2021    9:20 AM 08/29/2020   11:22 AM 06/19/2020   10:59 AM  PHQ 2/9 Scores  PHQ - 2 Score 0 0 0 0 0 0 0  PHQ- 9 Score 5    4      Fall Risk    08/12/2022    4:34 PM 06/30/2022   10:57 AM 12/25/2021   11:06 AM 08/01/2021    4:54 PM 06/25/2021    9:20 AM  Fall Risk   Falls in the past year? 0 0 0 0 0  Number falls in past yr: 0 0 0 0   Injury with Fall? 0 0 0 0   Risk for fall due to : No Fall Risks No Fall Risks No Fall Risks No Fall Risks   Follow up Falls prevention discussed Falls evaluation completed Falls evaluation completed Falls evaluation completed     FALL RISK PREVENTION PERTAINING TO THE HOME:  Any stairs in or around the home? Yes  If so, are there any without handrails? No  Home free of loose throw rugs in walkways, pet beds, electrical cords, etc? Yes  Adequate lighting in your home to reduce risk of falls? Yes   ASSISTIVE DEVICES UTILIZED TO PREVENT FALLS:  Life alert? No  Use of a cane, walker or w/c? No  Grab bars in the bathroom? No  Shower chair or bench in shower? No  Elevated toilet seat or a  handicapped toilet? No   TIMED UP AND GO:  Was the test performed? Yes .  Length of time to ambulate 10 feet: 9 sec.   Gait steady and fast without use of assistive device  Cognitive Function:    08/01/2021    4:55 PM 02/25/2021   10:58 AM 08/19/2020    2:57 PM 02/19/2020    3:16 PM  MMSE - Mini Mental State Exam  Not completed: Unable to complete     Orientation to time  1 1 1   Orientation to Place  3 3 3   Registration  3 3 3   Attention/ Calculation  1 1 3   Recall  0 0 0  Language- name 2 objects  2 2 2   Language- repeat  0 0 0  Language- repeat-comments  unable to do. UTO- language barrier   Language- follow 3 step command  1 3 3   Language- read & follow direction  1 1 1   Write a sentence  1 1 1   Copy design  1 1 1   Total score  14 16 18         Immunizations Immunization History  Administered Date(s) Administered   Fluad Quad(high Dose 65+) 12/20/2019, 12/25/2021   PFIZER(Purple Top)SARS-COV-2 Vaccination 05/13/2019, 06/03/2019   Pneumococcal Conjugate-13 07/17/2015   Pneumococcal Polysaccharide-23 09/10/2016   Tdap 02/23/2019   Zoster Recombinat (Shingrix) 09/19/2015, 09/18/2016   Zoster, Live 07/17/2015    TDAP status: Up to date  Flu Vaccine status: Up to date  Pneumococcal vaccine status: Up to date  Covid-19 vaccine status: Completed vaccines  Qualifies for Shingles Vaccine? Yes   Zostavax completed Yes   Shingrix Completed?:  Yes  Screening Tests Health Maintenance  Topic Date Due   COVID-19 Vaccine (3 - 2023-24 season) 10/31/2021   INFLUENZA VACCINE  10/01/2022   Medicare Annual Wellness (AWV)  08/12/2023   DTaP/Tdap/Td (2 - Td or Tdap) 02/22/2029   Pneumonia Vaccine 88+ Years old  Completed   Hepatitis C Screening  Completed   Zoster Vaccines- Shingrix  Completed   HPV VACCINES  Aged Out   Colonoscopy  Discontinued    Health Maintenance  Health Maintenance Due  Topic Date Due   COVID-19 Vaccine (3 - 2023-24 season) 10/31/2021     Colorectal cancer screening: No longer required.   Lung Cancer Screening: (Low Dose CT Chest recommended if Age 57-80 years, 30 pack-year currently smoking OR have quit w/in 15years.) does not qualify.   Lung Cancer Screening Referral: no  Additional Screening:  Hepatitis C Screening: does qualify; Completed 06/25/2021  Vision Screening: Recommended annual ophthalmology exams for early detection of glaucoma and other disorders of the eye. Is the patient up to date with their annual eye exam?  No  Who is the provider or what is the name of the office in which the patient attends annual eye exams? None  If pt is not established with a provider, would they like to be referred to a provider to establish care? No . Patient's wife will schedule him with her ophthalmologist.  Dental Screening: Recommended annual dental exams for proper oral hygiene  Community Resource Referral / Chronic Care Management: CRR required this visit?  No   CCM required this visit?  No      Plan:     I have personally reviewed and noted the following in the patient's chart:   Medical and social history Use of alcohol, tobacco or illicit drugs  Current medications and supplements including opioid prescriptions. Patient is not currently taking opioid prescriptions. Functional ability and status Nutritional status Physical activity Advanced directives List of other physicians Hospitalizations, surgeries, and ER visits in previous 12 months Vitals Screenings to include cognitive, depression, and falls Referrals and appointments  In addition, I have reviewed and discussed with patient certain preventive protocols, quality metrics, and best practice recommendations. A written personalized care plan for preventive services as well as general preventive health recommendations were provided to patient.     Mickeal Needy, LPN   06/08/8117   Nurse Notes:  Cognitive status assessed by direct  observation.  Patient has current diagnosis of cognitive impairment.  Patient is followed by neurology for ongoing assessment.  Patient is unable to complete screening 6CIT or MMSE.

## 2022-08-19 ENCOUNTER — Other Ambulatory Visit: Payer: Self-pay | Admitting: Neurology

## 2022-09-29 ENCOUNTER — Ambulatory Visit: Payer: PPO | Admitting: Neurology

## 2022-10-06 ENCOUNTER — Ambulatory Visit: Payer: PPO | Admitting: Neurology

## 2022-10-15 ENCOUNTER — Ambulatory Visit: Payer: PPO | Admitting: Neurology

## 2022-10-23 ENCOUNTER — Other Ambulatory Visit: Payer: Self-pay | Admitting: Emergency Medicine

## 2022-10-23 DIAGNOSIS — I10 Essential (primary) hypertension: Secondary | ICD-10-CM

## 2022-10-24 ENCOUNTER — Other Ambulatory Visit: Payer: Self-pay | Admitting: Emergency Medicine

## 2022-10-24 DIAGNOSIS — E785 Hyperlipidemia, unspecified: Secondary | ICD-10-CM

## 2022-11-03 ENCOUNTER — Encounter: Payer: Self-pay | Admitting: Emergency Medicine

## 2022-11-03 ENCOUNTER — Ambulatory Visit (INDEPENDENT_AMBULATORY_CARE_PROVIDER_SITE_OTHER): Payer: PPO | Admitting: Emergency Medicine

## 2022-11-03 VITALS — BP 112/60 | HR 45 | Temp 97.8°F | Resp 16 | Ht 63.0 in | Wt 146.0 lb

## 2022-11-03 DIAGNOSIS — E785 Hyperlipidemia, unspecified: Secondary | ICD-10-CM

## 2022-11-03 DIAGNOSIS — I10 Essential (primary) hypertension: Secondary | ICD-10-CM

## 2022-11-03 DIAGNOSIS — F0280 Dementia in other diseases classified elsewhere without behavioral disturbance: Secondary | ICD-10-CM

## 2022-11-03 DIAGNOSIS — Z23 Encounter for immunization: Secondary | ICD-10-CM

## 2022-11-03 DIAGNOSIS — G309 Alzheimer's disease, unspecified: Secondary | ICD-10-CM | POA: Diagnosis not present

## 2022-11-03 NOTE — Progress Notes (Signed)
Eric Floyd 77 y.o.   Chief Complaint  Patient presents with  . Medical Management of Chronic Issues    HISTORY OF PRESENT ILLNESS: This is a 77 y.o. male here for follow-up of chronic medical conditions.  Accompanied by wife. Patient has history of Alzheimer's disease. Eating and sleeping well.  Staying physically active. No complaints or medical concerns today.  HPI   Prior to Admission medications   Medication Sig Start Date End Date Taking? Authorizing Provider  Ascorbic Acid (VITAMIN C) 100 MG tablet Take 500 mg by mouth daily.   Yes [provider]  Calcium Carb-Cholecalciferol (CALCIUM 600 + D PO) Take by mouth.   Yes [provider]  cetirizine (ZYRTEC) 10 MG tablet Take 10 mg by mouth daily.   Yes [provider]  COCONUT OIL PO Take by mouth. Once daily   Yes [provider]  Cyanocobalamin (VITAMIN B-12 PO) Take by mouth.   Yes [provider]  ipratropium (ATROVENT) 0.03 % nasal spray Place 2 sprays into both nostrils every 12 (twelve) hours. 06/30/22  Yes Georgina Quint, MD  lisinopril (ZESTRIL) 10 MG tablet Take 1 tablet by mouth once daily 10/23/22  Yes Johanna Stafford, Eilleen Kempf, MD  memantine [facility]) 5 MG tablet Take 1 tablet by mouth twice daily 08/19/22  Yes Glean Salvo, NP  Multiple Vitamin (MULTIVITAMIN) tablet Take 1 tablet by mouth daily.   Yes [provider]  NON FORMULARY Take 1 capsule by mouth daily. [address] [address] MCT Oil   Yes [provider]  NON FORMULARY Take 1 tablet by mouth daily. Focus factor   Yes [provider]  Omega-3 Fatty Acids (FISH OIL OMEGA-3 PO) Take by mouth.   Yes [provider]  rosuvastatin (CRESTOR) 10 MG tablet Take 1 tablet by mouth once daily 10/25/22  Yes Girtie Wiersma, Eilleen Kempf, MD  Vitamin D, Ergocalciferol, (DRISDOL) 1.25 MG (50000 UNIT) CAPS capsule TAKE 1 CAPSULE EVERY 7 DAYS 02/26/20  Yes Georgina Quint, MD    No Known  Allergies  Patient Active Problem List   Diagnosis Date Noted  . Sinus bradycardia on ECG 08/29/2020  . Alzheimer's disease (HCC) 06/19/2020  . Essential hypertension 06/19/2020  . Diverticulosis 06/19/2020  . Memory difficulty 02/19/2020  . Vitamin D deficiency 12/21/2019  . Dyslipidemia 12/21/2019    Past Medical History:  Diagnosis Date  . Hyperlipidemia   . Memory difficulty 02/19/2020    Past Surgical History:  Procedure Laterality Date  . NO PAST SURGERIES      Social History   Socioeconomic History  . Marital status: Married    Spouse name: Not on file  . Number of children: Not on file  . Years of education: Not on file  . Highest education level: Not on file  Occupational History  . Not on file  Tobacco Use  . Smoking status: Never  . Smokeless tobacco: Never  Vaping Use  . Vaping status: Never Used  Substance and Sexual Activity  . Alcohol use: Yes    Alcohol/week: 21.0 standard drinks of alcohol    Types: 21 Shots of liquor per week    Comment: 3 drinks a day per pt  . Drug use: Never  . Sexual activity: Not on file  Other Topics Concern  . Not on file  Social History Narrative   Lives with wife   Right handed   Drinks 1-2 cups caffeine daily   Social Determinants of Corporate investment banker  Strain: Low Risk  (08/12/2022)   Overall Financial Resource Strain (CARDIA)   . Difficulty of Paying Living Expenses: Not hard at all  Food Insecurity: No Food Insecurity (08/12/2022)   Hunger Vital Sign   . Worried About Programme researcher, broadcasting/film/video in the Last Year: Never true   . Ran Out of Food in the Last Year: Never true  Transportation Needs: No Transportation Needs (08/12/2022)   PRAPARE - Transportation   . Lack of Transportation (Medical): No   . Lack of Transportation (Non-Medical): No  Physical Activity: Inactive (08/12/2022)   Exercise Vital Sign   . Days of Exercise per Week: 0 days   . Minutes of Exercise per Session: 0 min  Stress: No Stress  Concern Present (08/12/2022)   [employer] of Occupational Health - Occupational Stress Questionnaire   . Feeling of Stress : Not at all  Social Connections: Socially Integrated (08/12/2022)   Social Connection and Isolation Panel [NHANES]   . Frequency of Communication with Friends and Family: More than three times a week   . Frequency of Social Gatherings with Friends and Family: More than three times a week   . Attends Religious Services: More than 4 times per year   . Active Member of Clubs or Organizations: Yes   . Attends Banker Meetings: More than 4 times per year   . Marital Status: Married  Catering manager Violence: Not At Risk (08/12/2022)   Humiliation, Afraid, Rape, and Kick questionnaire   . Fear of Current or Ex-Partner: No   . Emotionally Abused: No   . Physically Abused: No   . Sexually Abused: No    Family History  Problem Relation Age of Onset  . Stroke Mother   . Heart disease Brother   . Heart attack Brother   . Ovarian cancer Niece   . Colon cancer Neg Hx   . Colon polyps Neg Hx   . Esophageal cancer Neg Hx   . Rectal cancer Neg Hx   . Stomach cancer Neg Hx      Review of Systems  Constitutional: Negative.  Negative for chills and fever.  HENT: Negative.  Negative for congestion and sore throat.   Respiratory: Negative.  Negative for cough and shortness of breath.   Cardiovascular: Negative.  Negative for chest pain and palpitations.  Gastrointestinal:  Negative for abdominal pain, diarrhea, nausea and vomiting.  Genitourinary: Negative.  Negative for dysuria and hematuria.  Skin: Negative.  Negative for rash.  Neurological: Negative.  Negative for dizziness and headaches.  All other systems reviewed and are negative.   Vitals:   11/03/22 1050  BP: 112/60  Pulse: (!) 45  Resp: 16  Temp: 97.8 F (36.6 C)  SpO2: 98%    Physical Exam Vitals reviewed.  Constitutional:      Appearance: Normal appearance.  HENT:     Head:  Normocephalic.     Mouth/Throat:     Mouth: Mucous membranes are moist.     Pharynx: Oropharynx is clear.  Eyes:     Extraocular Movements: Extraocular movements intact.     Conjunctiva/sclera: Conjunctivae normal.     Pupils: Pupils are equal, round, and reactive to light.  Cardiovascular:     Rate and Rhythm: Normal rate and regular rhythm.     Pulses: Normal pulses.     Heart sounds: Normal heart sounds.  Pulmonary:     Effort: Pulmonary effort is normal.     Breath sounds: Normal breath sounds.  Abdominal:     Palpations: Abdomen is soft.     Tenderness: There is no abdominal tenderness.  Musculoskeletal:     Cervical back: No tenderness.  Lymphadenopathy:     Cervical: No cervical adenopathy.  Skin:    General: Skin is warm and dry.     Capillary Refill: Capillary refill takes less than 2 seconds.  Neurological:     General: No focal deficit present.     Mental Status: He is alert and oriented to person, place, and time.  Psychiatric:        Mood and Affect: Mood normal.        Behavior: Behavior normal.     ASSESSMENT & PLAN: A total of 42 minutes was spent with the patient and counseling/coordination of care regarding preparing for this visit, review of most recent office visit notes, review of multiple chronic medical conditions and their management, review of all medications, review of most recent blood work results, cardiovascular risks associated with hypertension and dyslipidemia, education on nutrition, prognosis, documentation, and need for follow-up.  Problem List Items Addressed This Visit       Cardiovascular and Mediastinum   Essential hypertension - Primary    BP Readings from Last 3 Encounters:  11/03/22 112/60  08/12/22 118/60  06/30/22 120/66  Well-controlled hypertension Continue lisinopril 10 mg daily Cardiovascular risks associated with hypertension discussed Dietary approaches to stop hypertension discussed         Nervous and Auditory    Alzheimer's disease (HCC)    Stable chronic condition. Continues Namenda 5 mg twice a day        Other   Dyslipidemia    Chronic stable condition Continues rosuvastatin 10 mg daily Diet and nutrition discussed The 10-year ASCVD risk score (Arnett DK, et al., 2019) is: 23.8%   Values used to calculate the score:     Age: 33 years     Sex: Male     Is Non-Hispanic African American: No     Diabetic: No     Tobacco smoker: No     Systolic Blood Pressure: 112 mmHg     Is BP treated: Yes     HDL Cholesterol: 46.2 mg/dL     Total Cholesterol: 165 mg/dL       Patient Instructions  Mantenimiento de la salud despus de los 65 aos de edad Health Maintenance After Age 59 Despus de los 65 aos de edad, corre un riesgo mayor de Film/video editor enfermedades e infecciones a Air cabin crew, como tambin de sufrir lesiones por cadas. Las cadas son la causa principal de las fracturas de huesos y lesiones en la cabeza de personas mayores de 65 aos de edad. Recibir cuidados preventivos de forma regular puede ayudarlo a mantenerse saludable y en buen [place]. Los cuidados preventivos incluyen realizarse anlisis de forma regular y Forensic psychologist en el estilo de vida segn las recomendaciones del mdico. Converse con el mdico sobre lo siguiente: Las pruebas de deteccin y los anlisis que debe International aid/development worker. Una prueba de deteccin es un estudio que se para Engineer, manufacturing la presencia de una enfermedad cuando no tiene sntomas. Un plan de dieta y ejercicios adecuado para usted. Qu debo saber sobre las pruebas de deteccin y los anlisis para prevenir cadas? Realizarse pruebas de deteccin y [employer] es la mejor manera de Engineer, manufacturing un problema de salud de forma temprana. El diagnstico y tratamiento tempranos le brindan la mejor oportunidad de Chief Operating Officer las afecciones mdicas que son comunes despus de los  65 aos de edad. Ciertas afecciones y elecciones de estilo de vida pueden hacer que sea ms propenso a  sufrir Engineer, site. El mdico puede recomendarle lo siguiente: Controles regulares de la visin. Una visin deficiente y afecciones como las cataratas pueden hacer que sea ms propenso a sufrir Engineer, site. Si [place] lentes, asegrese de obtener una receta actualizada si su visin cambia. Revisin de medicamentos. Revise regularmente con el mdico todos los medicamentos que toma, incluidos los medicamentos de [place]. Consulte al [employer] efectos secundarios que pueden hacer que sea ms propenso a sufrir Engineer, site. Informe al mdico si alguno de los medicamentos que toma lo hace sentir mareado o somnoliento. Controles de fuerza y equilibrio. El mdico puede recomendar ciertos estudios para controlar su fuerza y equilibrio al estar de pie, al caminar o al cambiar de posicin. Examen de los pies. El dolor y Materials engineer en los pies, como tambin no utilizar el calzado [place], pueden hacer que sea ms propenso a sufrir Engineer, site. Pruebas de deteccin, que incluyen las siguientes: Pruebas de deteccin para la osteoporosis. La osteoporosis es una afeccin que hace que los huesos se tornen ms dbiles y se quiebren con ms facilidad. Pruebas de deteccin para la presin arterial. Los cambios en la presin arterial y los medicamentos para Chief Operating Officer la presin arterial pueden hacerlo sentir mareado. Prueba de deteccin de la depresin. Es ms probable que sufra una cada si tiene miedo a caerse, se siente deprimido o se siente incapaz de Probation officer. Prueba de deteccin de consumo de alcohol. Beber demasiado alcohol puede afectar su equilibrio y puede hacer que sea ms propenso a sufrir Engineer, site. Siga estas indicaciones en su casa: Estilo de vida No beba alcohol si: Su mdico le indica no hacerlo. Si bebe alcohol: Limite la cantidad que bebe a lo siguiente: De 0 a 1 medida por da para las mujeres. De 0 a 2 medidas por da para los hombres. Sepa cunta cantidad de  alcohol hay en las bebidas que toma. En los [address], una medida equivale a una botella de cerveza de 12 oz (355 ml), un vaso de vino de 5 oz (148 ml) o un vaso de una bebida alcohlica de alta graduacin de 1 oz (44 ml). No consuma ningn producto que contenga nicotina o tabaco. Estos productos incluyen cigarrillos, tabaco para Theatre manager y aparatos de vapeo, como los Administrator, Civil Service. Si necesita ayuda para dejar de consumir estos productos, consulte al [employer]. Actividad  Siga un programa de ejercicio regular para mantenerse en forma. Esto lo ayudar a Radio producer equilibrio. Consulte al mdico qu tipos de ejercicios son adecuados para usted. Si necesita un bastn o un andador, selo segn las recomendaciones del mdico. Utilice calzado con buen apoyo y suela antideslizante. Seguridad  Retire los [employer] puedan causar tropiezos tales como alfombras, cables u obstculos. Instale equipos de seguridad, como barras para sostn en los baos y barandas de seguridad en las escaleras. Mantenga las habitaciones y los pasillos bien iluminados. Indicaciones generales Hable con el mdico sobre sus riesgos de sufrir una cada. Infrmele a su mdico si: Se cae. Asegrese de informarle a su mdico acerca de todas las cadas, incluso aquellas que parecen ser [employer]. Se siente mareado, cansado (tiene fatiga) o siente que pierde el equilibrio. Use los medicamentos de venta libre y los recetados solamente como se lo haya indicado el mdico. Estos incluyen suplementos. Siga una dieta sana y [place] un peso saludable. [place]omia Dear dieta  saludable incluye productos lcteos descremados, carnes bajas en contenido de grasa ([place]), fibra de granos enteros, frijoles y [place] frutas y verduras. Mantngase al da con las vacunas. Realcese los estudios de rutina de la salud, dentales y de Wellsite geologist. Resumen Tener un estilo de vida saludable y recibir cuidados preventivos pueden ayudar a Research scientist (physical sciences) salud y el  bienestar despus de los 65 aos de [place]. Realizarse pruebas de deteccin y [employer] es la mejor manera de Engineer, manufacturing un problema de salud de forma temprana y [place] a Automotive engineer una cada. El diagnstico y tratamiento tempranos le brindan la mejor oportunidad de Chief Operating Officer las afecciones mdicas ms comunes en las personas mayores de 65 aos de edad. Las cadas son la causa principal de las fracturas de huesos y lesiones en la cabeza de personas mayores de 65 aos de edad. Tome precauciones para evitar una cada en su casa. Trabaje con el mdico para saber qu cambios que puede hacer para mejorar su salud y [place], y para prevenir las cadas. Esta informacin no tiene Theme park manager el consejo del mdico. Asegrese de hacerle al mdico cualquier pregunta que tenga. Document Revised: 07/24/2020 Document Reviewed: 07/24/2020 Elsevier Patient Education  2024 Elsevier Inc.      Edwina Barth, MD [facility]

## 2022-11-03 NOTE — Assessment & Plan Note (Signed)
Stable chronic condition. Continues Namenda 5 mg twice a day

## 2022-11-03 NOTE — Assessment & Plan Note (Addendum)
Chronic stable condition Continues rosuvastatin 10 mg daily Diet and nutrition discussed The 10-year ASCVD risk score (Arnett DK, et al., 2019) is: 23.8%   Values used to calculate the score:     Age: 77 years     Sex: Male     Is Non-Hispanic African American: No     Diabetic: No     Tobacco smoker: No     Systolic Blood Pressure: 112 mmHg     Is BP treated: Yes     HDL Cholesterol: 46.2 mg/dL     Total Cholesterol: 165 mg/dL

## 2022-11-03 NOTE — Assessment & Plan Note (Signed)
BP Readings from Last 3 Encounters:  11/03/22 112/60  08/12/22 118/60  06/30/22 120/66  Well-controlled hypertension Continue lisinopril 10 mg daily Cardiovascular risks associated with hypertension discussed Dietary approaches to stop hypertension discussed

## 2022-11-03 NOTE — Patient Instructions (Signed)
Mantenimiento de la salud despus de los 65 aos de edad Health Maintenance After Age 76 Despus de los 65 aos de edad, corre un riesgo mayor de padecer ciertas enfermedades e infecciones a largo plazo, como tambin de sufrir lesiones por cadas. Las cadas son la causa principal de las fracturas de huesos y lesiones en la cabeza de personas mayores de 65 aos de edad. Recibir cuidados preventivos de forma regular puede ayudarlo a mantenerse saludable y en buen estado. Los cuidados preventivos incluyen realizarse anlisis de forma regular y realizar cambios en el estilo de vida segn las recomendaciones del mdico. Converse con el mdico sobre lo siguiente: Las pruebas de deteccin y los anlisis que debe realizarse. Una prueba de deteccin es un estudio que se para detectar la presencia de una enfermedad cuando no tiene sntomas. Un plan de dieta y ejercicios adecuado para usted. Qu debo saber sobre las pruebas de deteccin y los anlisis para prevenir cadas? Realizarse pruebas de deteccin y anlisis es la mejor manera de detectar un problema de salud de forma temprana. El diagnstico y tratamiento tempranos le brindan la mejor oportunidad de controlar las afecciones mdicas que son comunes despus de los 65 aos de edad. Ciertas afecciones y elecciones de estilo de vida pueden hacer que sea ms propenso a sufrir una cada. El mdico puede recomendarle lo siguiente: Controles regulares de la visin. Una visin deficiente y afecciones como las cataratas pueden hacer que sea ms propenso a sufrir una cada. Si usa lentes, asegrese de obtener una receta actualizada si su visin cambia. Revisin de medicamentos. Revise regularmente con el mdico todos los medicamentos que toma, incluidos los medicamentos de venta libre. Consulte al mdico sobre los efectos secundarios que pueden hacer que sea ms propenso a sufrir una cada. Informe al mdico si alguno de los medicamentos que toma lo hace sentir mareado o  somnoliento. Controles de fuerza y equilibrio. El mdico puede recomendar ciertos estudios para controlar su fuerza y equilibrio al estar de pie, al caminar o al cambiar de posicin. Examen de los pies. El dolor y el adormecimiento en los pies, como tambin no utilizar el calzado adecuado, pueden hacer que sea ms propenso a sufrir una cada. Pruebas de deteccin, que incluyen las siguientes: Pruebas de deteccin para la osteoporosis. La osteoporosis es una afeccin que hace que los huesos se tornen ms dbiles y se quiebren con ms facilidad. Pruebas de deteccin para la presin arterial. Los cambios en la presin arterial y los medicamentos para controlar la presin arterial pueden hacerlo sentir mareado. Prueba de deteccin de la depresin. Es ms probable que sufra una cada si tiene miedo a caerse, se siente deprimido o se siente incapaz de realizar actividades que sola hacer. Prueba de deteccin de consumo de alcohol. Beber demasiado alcohol puede afectar su equilibrio y puede hacer que sea ms propenso a sufrir una cada. Siga estas indicaciones en su casa: Estilo de vida No beba alcohol si: Su mdico le indica no hacerlo. Si bebe alcohol: Limite la cantidad que bebe a lo siguiente: De 0 a 1 medida por da para las mujeres. De 0 a 2 medidas por da para los hombres. Sepa cunta cantidad de alcohol hay en las bebidas que toma. En los [place], una medida equivale a una botella de cerveza de 12 oz (355 ml), un vaso de vino de 5 oz (148 ml) o un vaso de una bebida alcohlica de alta graduacin de 1 oz (44 ml). No consuma ningn producto que   contenga nicotina o tabaco. Estos productos incluyen cigarrillos, tabaco para mascar y aparatos de vapeo, como los cigarrillos electrnicos. Si necesita ayuda para dejar de consumir estos productos, consulte al mdico. Actividad  Siga un programa de ejercicio regular para mantenerse en forma. Esto lo ayudar a mantener el equilibrio. Consulte al  mdico qu tipos de ejercicios son adecuados para usted. Si necesita un bastn o un andador, selo segn las recomendaciones del mdico. Utilice calzado con buen apoyo y suela antideslizante. Seguridad  Retire los objetos que puedan causar tropiezos tales como alfombras, cables u obstculos. Instale equipos de seguridad, como barras para sostn en los baos y barandas de seguridad en las escaleras. Mantenga las habitaciones y los pasillos bien iluminados. Indicaciones generales Hable con el mdico sobre sus riesgos de sufrir una cada. Infrmele a su mdico si: Se cae. Asegrese de informarle a su mdico acerca de todas las cadas, incluso aquellas que parecen ser menores. Se siente mareado, cansado (tiene fatiga) o siente que pierde el equilibrio. Use los medicamentos de venta libre y los recetados solamente como se lo haya indicado el mdico. Estos incluyen suplementos. Siga una dieta sana y mantenga un peso saludable. Una dieta saludable incluye productos lcteos descremados, carnes bajas en contenido de grasa (magras), fibra de granos enteros, frijoles y muchas frutas y verduras. Mantngase al da con las vacunas. Realcese los estudios de rutina de la salud, dentales y de la vista. Resumen Tener un estilo de vida saludable y recibir cuidados preventivos pueden ayudar a promover la salud y el bienestar despus de los 65 aos de edad. Realizarse pruebas de deteccin y anlisis es la mejor manera de detectar un problema de salud de forma temprana y ayudarlo a evitar una cada. El diagnstico y tratamiento tempranos le brindan la mejor oportunidad de controlar las afecciones mdicas ms comunes en las personas mayores de 65 aos de edad. Las cadas son la causa principal de las fracturas de huesos y lesiones en la cabeza de personas mayores de 65 aos de edad. Tome precauciones para evitar una cada en su casa. Trabaje con el mdico para saber qu cambios que puede hacer para mejorar su salud y  bienestar, y para prevenir las cadas. Esta informacin no tiene como fin reemplazar el consejo del mdico. Asegrese de hacerle al mdico cualquier pregunta que tenga. Document Revised: 07/24/2020 Document Reviewed: 07/24/2020 Elsevier Patient Education  2024 Elsevier Inc.  

## 2022-11-04 NOTE — Progress Notes (Deleted)
PATIENT: Eric Floyd DOB: April 30, 1945  REASON FOR VISIT: follow up for memory HISTORY FROM: patient, wife, interpreter  Primary Neurologist: Dr. Anne Hahn, will now be followed by Dr. Frances Furbish  HISTORY OF PRESENT ILLNESS: Today 11/04/22 Here with his wife, for last 6 months giving him [place] [address] Brain Oil, thinks making him stable. He will go the wrong place in his house, no falls. We reduced the dose of Namenda to 5 mg BID, he doesn't think dizzy, he hold walls less. Does his own ADLs. He doesn't want to exercise, has gained 8 lbs. Has a good appetite, sleeps good. Watches TV. Mood is good. Going [place]. Feels like he is steadier.   Update 08/26/21 SS: Eric Floyd is here today for follow-up. On Namenda 10 mg BID. Doing well overall. Wife may note fidgety hands when sitting, If he stands up, may feel dizzy, seems to be since increasing Namenda. Often will reach for something to hold onto. [place] went [place] last month, he did well. Rare alcohol use, only 1 beer a month. Is sedentary, doesn't do his walking anymore. Does his own ADLs. No falls.    Update 02/25/2021 SS: Mr. Eric Floyd here today for follow-up for Alzheimer's Disease. Last visit HR was 40, Aricept was stopped, was sent to cardiology. Determined no need for pacemaker, was not symptomatic. MMSE 14/30 today. Misplaces things, looks at old pictures, can't remember the names. He has calmed down greatly, due to cutting back on alcohol. Likes to watch TV, is rather sedentary. Has increased appetite. Enjoyed watching the world cup. Sleeping well, does get up 2-3 times to urinate. He doesn't drive.  Wife does medications, has been taking Namenda 10 mg 1/2 tablet twice daily. Does own ADLs. They went to [place] in August.  Update 08/19/2020 SS: Mr. Eric Floyd is a 77 year old male with history of memory disturbance.  MRI of the brain showed generalized atrophy consistent with Alzheimer's disease, mild white matter  changes. MMSE 16/30. Has trouble remembering where to put things, forgets what his wife send him for in the house. Not driving anymore. Wife manages his finances. Spends his day reading, watching TV. Takes naps during day, sometimes trouble sleeping at night if naps during day. At times gets upset at his wife, frustrated when she corrects him. He has 3 mixed drinks daily, more on the weekend. Has fallen out of bed twice. Lost 8 lbs since last seen. HR is noted to be 40 today, is asymptomatic.  HISTORY 02/19/2020 Dr. Anne Hahn: Mr. Eric Floyd is a 63 year old right-handed Hispanic male with a 1 year history of changes in memory.  He comes in with his wife today who indicates that she has noted some significant changes in memory for least a year or year and a half prior to this visit.  The wife noted that he was having some increasing problems with driving, he was starting to get lost, she has not allowed him to drive over the last year.  She now has taken over the finances and is managing his medications over the last year, she also keeps up with the appointments.  The patient has short-term memory issues, he does have some problems with remembering names.  His older sister apparently also has memory problems.  The patient denies any headaches or dizziness, he denies numbness or weakness of the extremities or difficulty with balance or difficulty controlling the bowels or the bladder.  He is sleeping fairly well, but he has  a tendency to nap during the day and stay awake and watch TV at night.  He is sent to this office for further evaluation.  He has had recent blood work done that shows a normal B12 level.  REVIEW OF SYSTEMS: Out of a complete 14 system review of symptoms, the patient complains only of the following symptoms, and all other reviewed systems are negative.   See HPI  ALLERGIES: No Known Allergies  HOME MEDICATIONS: Outpatient Medications Prior to Visit  Medication Sig Dispense Refill    Ascorbic Acid (VITAMIN C) 100 MG tablet Take 500 mg by mouth daily.     Calcium Carb-Cholecalciferol (CALCIUM 600 + D PO) Take by mouth.     cetirizine (ZYRTEC) 10 MG tablet Take 10 mg by mouth daily.     COCONUT OIL PO Take by mouth. Once daily     Cyanocobalamin (VITAMIN B-12 PO) Take by mouth.     ipratropium (ATROVENT) 0.03 % nasal spray Place 2 sprays into both nostrils every 12 (twelve) hours. 30 mL 12   lisinopril (ZESTRIL) 10 MG tablet Take 1 tablet by mouth once daily 90 tablet 3   memantine (NAMENDA) 5 MG tablet Take 1 tablet by mouth twice daily 180 tablet 0   Multiple Vitamin (MULTIVITAMIN) tablet Take 1 tablet by mouth daily.     NON FORMULARY Take 1 capsule by mouth daily. [address] [address] MCT Oil     NON FORMULARY Take 1 tablet by mouth daily. Focus factor     Omega-3 Fatty Acids (FISH OIL OMEGA-3 PO) Take by mouth.     rosuvastatin (CRESTOR) 10 MG tablet Take 1 tablet by mouth once daily 90 tablet 3   Vitamin D, Ergocalciferol, (DRISDOL) 1.25 MG (50000 UNIT) CAPS capsule TAKE 1 CAPSULE EVERY 7 DAYS 7 capsule 0   No facility-administered medications prior to visit.    PAST MEDICAL HISTORY: Past Medical History:  Diagnosis Date   Hyperlipidemia    Memory difficulty 02/19/2020    PAST SURGICAL HISTORY: Past Surgical History:  Procedure Laterality Date   NO PAST SURGERIES      FAMILY HISTORY: Family History  Problem Relation Age of Onset   Stroke Mother    Heart disease Brother    Heart attack Brother    Ovarian cancer Niece    Colon cancer Neg Hx    Colon polyps Neg Hx    Esophageal cancer Neg Hx    Rectal cancer Neg Hx    Stomach cancer Neg Hx     SOCIAL HISTORY: Social History   Socioeconomic History   Marital status: Married    Spouse name: Not on file   Number of children: Not on file   Years of education: Not on file   Highest education level: Not on file  Occupational History   Not on file  Tobacco Use   Smoking status: Never   Smokeless  tobacco: Never  Vaping Use   Vaping status: Never Used  Substance and Sexual Activity   Alcohol use: Yes    Alcohol/week: 21.0 standard drinks of alcohol    Types: 21 Shots of liquor per week    Comment: 3 drinks a day per pt   Drug use: Never   Sexual activity: Not on file  Other Topics Concern   Not on file  Social History Narrative   Lives with wife   Right handed   Drinks 1-2 cups caffeine daily   Social Determinants of Health   Financial Resource Strain:  Low Risk  (08/12/2022)   Overall Financial Resource Strain (CARDIA)    Difficulty of Paying Living Expenses: Not hard at all  Food Insecurity: No Food Insecurity (08/12/2022)   Hunger Vital Sign    Worried About Running Out of Food in the Last Year: Never true    Ran Out of Food in the Last Year: Never true  Transportation Needs: No Transportation Needs (08/12/2022)   PRAPARE - Administrator, Civil Service (Medical): No    Lack of Transportation (Non-Medical): No  Physical Activity: Inactive (08/12/2022)   Exercise Vital Sign    Days of Exercise per Week: 0 days    Minutes of Exercise per Session: 0 min  Stress: No Stress Concern Present (08/12/2022)   [employer] of Occupational Health - Occupational Stress Questionnaire    Feeling of Stress : Not at all  Social Connections: Socially Integrated (08/12/2022)   Social Connection and Isolation Panel [NHANES]    Frequency of Communication with Friends and Family: More than three times a week    Frequency of Social Gatherings with Friends and Family: More than three times a week    Attends Religious Services: More than 4 times per year    Active Member of [employer] or Organizations: Yes    Attends Banker Meetings: More than 4 times per year    Marital Status: Married  Catering manager Violence: Not At Risk (08/12/2022)   Humiliation, Afraid, Rape, and Kick questionnaire    Fear of Current or Ex-Partner: No    Emotionally Abused: No    Physically  Abused: No    Sexually Abused: No   PHYSICAL EXAM  There were no vitals filed for this visit.   There is no height or weight on file to calculate BMI.  Generalized: Well developed, in no acute distress     08/01/2021    4:55 PM 02/25/2021   10:58 AM 08/19/2020    2:57 PM  MMSE - Mini Mental State Exam  Not completed: Unable to complete    Orientation to time  1 1  Orientation to Place  3 3  Registration  3 3  Attention/ Calculation  1 1  Recall  0 0  Language- name 2 objects  2 2  Language- repeat  0 0  Language- repeat-comments  unable to do. UTO- language barrier  Language- follow 3 step command  1 3  Language- read & follow direction  1 1  Write a sentence  1 1  Copy design  1 1  Total score  14 16   Neurological examination  Mentation: Alert, cooperative, smiling, history is mostly provided by his wife. Follows all commands speech and language fluent. Disoriented to date, place, thinks in [place]  Cranial nerve II-XII: Pupils were equal round reactive to light. Extraocular movements were full, visual field were full on confrontational test. Facial sensation and strength were normal. Head turning and shoulder shrug  were normal and symmetric. Motor: The motor testing reveals 5 over 5 strength of all 4 extremities. Good symmetric motor tone is noted throughout.  Sensory: Sensory testing is intact to soft touch on all 4 extremities. No evidence of extinction is noted.  Coordination: Cerebellar testing reveals good finger-nose-finger and heel-to-shin bilaterally.  Gait and station: Gait is normal, independent, steady  DIAGNOSTIC DATA (LABS, IMAGING, TESTING) - I reviewed patient records, labs, notes, testing and imaging myself where available.  Lab Results  Component Value Date   WBC 3.3 (  L) 06/30/2022   HGB 14.4 06/30/2022   HCT 43.2 06/30/2022   MCV 93.5 06/30/2022   PLT 213.0 06/30/2022      Component Value Date/Time   NA 136 06/30/2022 1132   NA 140  12/20/2019 1159   K 4.3 06/30/2022 1132   CL 100 06/30/2022 1132   CO2 30 06/30/2022 1132   GLUCOSE 86 06/30/2022 1132   BUN 19 06/30/2022 1132   BUN 15 12/20/2019 1159   CREATININE 0.99 06/30/2022 1132   CALCIUM 9.5 06/30/2022 1132   PROT 7.6 06/30/2022 1132   PROT 7.7 12/20/2019 1159   ALBUMIN 4.1 06/30/2022 1132   ALBUMIN 4.5 12/20/2019 1159   AST 28 06/30/2022 1132   ALT 17 06/30/2022 1132   ALKPHOS 49 06/30/2022 1132   BILITOT 0.4 06/30/2022 1132   BILITOT 0.5 12/20/2019 1159   GFRNONAA 81 12/20/2019 1159   GFRAA 94 12/20/2019 1159   Lab Results  Component Value Date   CHOL 165 06/30/2022   HDL 46.20 06/30/2022   LDLCALC 87 06/30/2022   TRIG 160.0 (H) 06/30/2022   CHOLHDL 4 06/30/2022   Lab Results  Component Value Date   HGBA1C 5.7 06/30/2022   Lab Results  Component Value Date   VITAMINB12 1,337 (H) 12/20/2019   Lab Results  Component Value Date   TSH 1.72 08/29/2020   ASSESSMENT AND PLAN 77 y.o. year old male  has a past medical history of Hyperlipidemia and Memory difficulty (02/19/2020). here with:  1.  Alzheimer's disease, 2.  Bradycardia  -No major changes reported -Continue Namenda 5 mg twice daily, full dosing resulted gait unsteadiness  -Holding Aricept due to bradycardia -Encouraged brain stimulating activity, physical exercise -Follow-up in 6 months or sooner if needed  Otila Kluver, DNP 11/04/2022, 8:54 AM [facility] [address], [address] [place], [place] [address] [phone]

## 2022-11-05 ENCOUNTER — Ambulatory Visit: Payer: PPO | Admitting: Neurology

## 2022-11-16 ENCOUNTER — Other Ambulatory Visit: Payer: Self-pay | Admitting: Neurology

## 2022-11-16 NOTE — Addendum Note (Signed)
Addended by: Theressa Stamps on: 11/16/2022 03:56 PM   Modules accepted: Orders

## 2023-02-09 ENCOUNTER — Other Ambulatory Visit: Payer: Self-pay | Admitting: Neurology

## 2023-05-04 ENCOUNTER — Ambulatory Visit: Payer: PPO | Admitting: Emergency Medicine

## 2023-05-04 ENCOUNTER — Encounter: Payer: Self-pay | Admitting: Emergency Medicine

## 2023-05-04 ENCOUNTER — Other Ambulatory Visit: Payer: Self-pay | Admitting: Neurology

## 2023-05-04 VITALS — BP 126/62 | HR 51 | Temp 98.4°F | Ht 63.0 in | Wt 150.0 lb

## 2023-05-04 DIAGNOSIS — F0280 Dementia in other diseases classified elsewhere without behavioral disturbance: Secondary | ICD-10-CM | POA: Diagnosis not present

## 2023-05-04 DIAGNOSIS — I10 Essential (primary) hypertension: Secondary | ICD-10-CM | POA: Diagnosis not present

## 2023-05-04 DIAGNOSIS — E785 Hyperlipidemia, unspecified: Secondary | ICD-10-CM | POA: Diagnosis not present

## 2023-05-04 DIAGNOSIS — R001 Bradycardia, unspecified: Secondary | ICD-10-CM

## 2023-05-04 DIAGNOSIS — G309 Alzheimer's disease, unspecified: Secondary | ICD-10-CM

## 2023-05-04 LAB — COMPREHENSIVE METABOLIC PANEL
ALT: 14 U/L (ref 0–53)
AST: 23 U/L (ref 0–37)
Albumin: 4.2 g/dL (ref 3.5–5.2)
Alkaline Phosphatase: 61 U/L (ref 39–117)
BUN: 21 mg/dL (ref 6–23)
CO2: 30 meq/L (ref 19–32)
Calcium: 9.4 mg/dL (ref 8.4–10.5)
Chloride: 100 meq/L (ref 96–112)
Creatinine, Ser: 0.93 mg/dL (ref 0.40–1.50)
GFR: 79.63 mL/min (ref >60.00)
Glucose, Bld: 89 mg/dL (ref 70–99)
Potassium: 4.1 meq/L (ref 3.5–5.1)
Sodium: 138 meq/L (ref 135–145)
Total Bilirubin: 0.6 mg/dL (ref 0.2–1.2)
Total Protein: 8 g/dL (ref 6.0–8.3)

## 2023-05-04 LAB — CBC WITH DIFFERENTIAL/PLATELET
Basophils Absolute: 0 10*3/uL (ref 0.0–0.1)
Basophils Relative: 0.3 % (ref 0.0–3.0)
Eosinophils Absolute: 0.1 10*3/uL (ref 0.0–0.7)
Eosinophils Relative: 1.9 % (ref 0.0–5.0)
HCT: 43.4 % (ref 39.0–52.0)
Hemoglobin: 14.2 g/dL (ref 13.0–17.0)
Lymphocytes Relative: 23.7 % (ref 12.0–46.0)
Lymphs Abs: 1.2 10*3/uL (ref 0.7–4.0)
MCHC: 32.7 g/dL (ref 30.0–36.0)
MCV: 95.4 fl (ref 78.0–100.0)
Monocytes Absolute: 0.4 10*3/uL (ref 0.1–1.0)
Monocytes Relative: 8.6 % (ref 3.0–12.0)
Neutro Abs: 3.2 10*3/uL (ref 1.4–7.7)
Neutrophils Relative %: 65.5 % (ref 43.0–77.0)
Platelets: 212 10*3/uL (ref 150.0–400.0)
RBC: 4.54 Mil/uL (ref 4.22–5.81)
RDW: 12.9 % (ref 11.5–15.5)
WBC: 4.9 10*3/uL (ref 4.0–10.5)

## 2023-05-04 LAB — TSH: TSH: 1.81 u[IU]/mL (ref 0.35–5.50)

## 2023-05-04 LAB — LIPID PANEL
Cholesterol: 165 mg/dL (ref 0–200)
HDL: 55.9 mg/dL (ref >39.00)
LDL Cholesterol: 93 mg/dL (ref 0–99)
NonHDL: 108.94
Total CHOL/HDL Ratio: 3
Triglycerides: 78 mg/dL (ref 0.0–149.0)
VLDL: 15.6 mg/dL (ref 0.0–40.0)

## 2023-05-04 LAB — HEMOGLOBIN A1C: Hgb A1c MFr Bld: 5.7 % (ref 4.6–6.5)

## 2023-05-04 NOTE — Assessment & Plan Note (Signed)
 BP Readings from Last 3 Encounters:  05/04/23 126/62  11/03/22 112/60  08/12/22 118/60  Well-controlled hypertension Continue lisinopril 10 mg daily Cardiovascular risks associated with hypertension discussed Dietary approaches to stop hypertension discussed

## 2023-05-04 NOTE — Patient Instructions (Signed)
 Mantenimiento de la salud despus de los 65 aos de edad Health Maintenance After Age 77 Despus de los 65 aos de edad, corre un riesgo mayor de Film/video editor enfermedades e infecciones a Air cabin crew, como tambin de sufrir lesiones por cadas. Las cadas son la causa principal de las fracturas de huesos y lesiones en la cabeza de personas mayores de 77 aos de edad. Recibir cuidados preventivos de forma regular puede ayudarlo a mantenerse saludable y en buen [place]. Los cuidados preventivos incluyen realizarse anlisis de forma regular y Forensic psychologist en el estilo de vida segn las recomendaciones del mdico. Converse con el mdico sobre lo siguiente: Las pruebas de deteccin y los anlisis que debe International aid/development worker. Una prueba de deteccin es un estudio que se para Engineer, manufacturing la presencia de una enfermedad cuando no tiene sntomas. Un plan de dieta y ejercicios adecuado para usted. Qu debo saber sobre las pruebas de deteccin y los anlisis para prevenir cadas? Realizarse pruebas de deteccin y [employer] es la mejor manera de Engineer, manufacturing un problema de salud de forma temprana. El diagnstico y tratamiento tempranos le brindan la mejor oportunidad de Chief Operating Officer las afecciones mdicas que son comunes despus de los 77 aos de edad. Ciertas afecciones y elecciones de estilo de vida pueden hacer que sea ms propenso a sufrir Engineer, site. El mdico puede recomendarle lo siguiente: Controles regulares de la visin. Una visin deficiente y afecciones como las cataratas pueden hacer que sea ms propenso a sufrir Engineer, site. Si [place] lentes, asegrese de obtener una receta actualizada si su visin cambia. Revisin de medicamentos. Revise regularmente con el mdico todos los medicamentos que toma, incluidos los medicamentos de [place]. Consulte al [employer] efectos secundarios que pueden hacer que sea ms propenso a sufrir Engineer, site. Informe al mdico si alguno de los medicamentos que toma lo hace sentir mareado o  somnoliento. Controles de fuerza y equilibrio. El mdico puede recomendar ciertos estudios para controlar su fuerza y equilibrio al estar de pie, al caminar o al cambiar de posicin. Examen de los pies. El dolor y Materials engineer en los pies, como tambin no utilizar el calzado [place], pueden hacer que sea ms propenso a sufrir Engineer, site. Pruebas de deteccin, que incluyen las siguientes: Pruebas de deteccin para la osteoporosis. La osteoporosis es una afeccin que hace que los huesos se tornen ms dbiles y se quiebren con ms facilidad. Pruebas de deteccin para la presin arterial. Los cambios en la presin arterial y los medicamentos para Chief Operating Officer la presin arterial pueden hacerlo sentir mareado. Prueba de deteccin de la depresin. Es ms probable que sufra una cada si tiene miedo a caerse, se siente deprimido o se siente incapaz de Probation officer. Prueba de deteccin de consumo de alcohol. Beber demasiado alcohol puede afectar su equilibrio y puede hacer que sea ms propenso a sufrir Engineer, site. Siga estas indicaciones en su casa: Estilo de vida No beba alcohol si: Su mdico le indica no hacerlo. Si bebe alcohol: Limite la cantidad que bebe a lo siguiente: De 0 a 1 medida por da para las mujeres. De 0 a 2 medidas por da para los hombres. Sepa cunta cantidad de alcohol hay en las bebidas que toma. En los [address], una medida equivale a una botella de cerveza de 12 oz (355 ml), un vaso de vino de 5 oz (148 ml) o un vaso de una bebida alcohlica de alta graduacin de 1 oz (44 ml). No consuma ningn producto que  contenga nicotina o tabaco. Estos productos incluyen cigarrillos, tabaco para Theatre manager y aparatos de vapeo, como los Administrator, Civil Service. Si necesita ayuda para dejar de consumir estos productos, consulte al [employer]. Actividad  Siga un programa de ejercicio regular para mantenerse en forma. Esto lo ayudar a Radio producer equilibrio. Consulte al  mdico qu tipos de ejercicios son adecuados para usted. Si necesita un bastn o un andador, selo segn las recomendaciones del mdico. Utilice calzado con buen apoyo y suela antideslizante. Seguridad  Retire los [employer] puedan causar tropiezos tales como alfombras, cables u obstculos. Instale equipos de seguridad, como barras para sostn en los baos y barandas de seguridad en las escaleras. Mantenga las habitaciones y los pasillos bien iluminados. Indicaciones generales Hable con el mdico sobre sus riesgos de sufrir una cada. Infrmele a su mdico si: Se cae. Asegrese de informarle a su mdico acerca de todas las cadas, incluso aquellas que parecen ser [employer]. Se siente mareado, cansado (tiene fatiga) o siente que pierde el equilibrio. Use los medicamentos de venta libre y los recetados solamente como se lo haya indicado el mdico. Estos incluyen suplementos. Siga una dieta sana y [place] un peso saludable. Una dieta saludable incluye productos lcteos descremados, carnes bajas en contenido de grasa ([place]), fibra de granos enteros, frijoles y [place] frutas y verduras. Mantngase al da con las vacunas. Realcese los estudios de rutina de la salud, dentales y de Wellsite geologist. Resumen Tener un estilo de vida saludable y recibir cuidados preventivos pueden ayudar a Research scientist (physical sciences) salud y el bienestar despus de los 77 aos de [place]. Realizarse pruebas de deteccin y [employer] es la mejor manera de Engineer, manufacturing un problema de salud de forma temprana y [place]ber [place] a Automotive engineer una cada. El diagnstico y tratamiento tempranos le brindan la mejor oportunidad de Chief Operating Officer las afecciones mdicas ms comunes en las personas mayores de 77 aos de edad. Las cadas son la causa principal de las fracturas de huesos y lesiones en la cabeza de personas mayores de 77 aos de edad. Tome precauciones para evitar una cada en su casa. Trabaje con el mdico para saber qu cambios que puede hacer para mejorar su salud y  [place], y para prevenir las cadas. Esta informacin no tiene Theme park manager el consejo del mdico. Asegrese de hacerle al mdico cualquier pregunta que tenga. Document Revised: 07/24/2020 Document Reviewed: 07/24/2020 Elsevier Patient Education  2024 [employer].

## 2023-05-04 NOTE — Assessment & Plan Note (Signed)
Chronic stable condition Continues rosuvastatin 10 mg daily Diet and nutrition discussed

## 2023-05-04 NOTE — Assessment & Plan Note (Signed)
 Stable chronic condition. Continues Namenda 5 mg twice a day Needs Follow-up with neurologist

## 2023-05-04 NOTE — Assessment & Plan Note (Signed)
Asymptomatic and normotensive EKG still showing sinus bradycardia.  Unchanged from 1 year ago Stable.  No concerns.

## 2023-05-04 NOTE — Progress Notes (Signed)
 Eric Floyd 78 y.o.   Chief Complaint  Patient presents with   Follow-up    6 month f/u for HTN. Patient has been coughing for the past 2 days. Patient has been sleeping way more than usual wife states, he has also been more confused and he's needing more help with dressing. Wife states his fingers and toes have become more purple and walking much slower than usual. Wife thinks it may be with his balance     HISTORY OF PRESENT ILLNESS: This is a 78 y.o. male here for 58-month follow-up of chronic medical conditions including hypertension Companied by wife. Developed cough 2 days ago but no other significant associated symptoms No recent falls even though sometimes he struggles with balance. History of Alzheimer's.  Last neurology office January 2024 Assessment and plan as follows:  ASSESSMENT AND PLAN 78 y.o. year old male  has a past medical history of Hyperlipidemia and Memory difficulty (02/19/2020). here with:   1.  Alzheimer's disease, 2.  Bradycardia   -No major changes reported -Continue Namenda 5 mg twice daily, full dosing resulted gait unsteadiness  -Holding Aricept due to bradycardia -Encouraged brain stimulating activity, physical exercise -Follow-up in 6 months or sooner if needed   Otila Kluver, DNP 03/10/2022, 3:07 PM [facility] [address], [address] [place], [place] [address] [phone]  HPI   Prior to Admission medications   Medication Sig Start Date End Date Taking? Authorizing Provider  Ascorbic Acid (VITAMIN C) 100 MG tablet Take 500 mg by mouth daily.   Yes [provider]  Calcium Carb-Cholecalciferol (CALCIUM 600 + D PO) Take by mouth.   Yes [provider]  cetirizine (ZYRTEC) 10 MG tablet Take 10 mg by mouth daily.   Yes [provider]  COCONUT OIL PO Take by mouth. Once daily   Yes [provider]  Cyanocobalamin (VITAMIN B-12 PO) Take by mouth.   Yes [provider]   ipratropium (ATROVENT) 0.03 % nasal spray Place 2 sprays into both nostrils every 12 (twelve) hours. 06/30/22  Yes Georgina Quint, MD  lisinopril (ZESTRIL) 10 MG tablet Take 1 tablet by mouth once daily 10/23/22  Yes Juliya Magill, Eilleen Kempf, MD  memantine [facility]) 5 MG tablet Take 1 tablet by mouth twice daily 02/10/23  Yes Glean Salvo, NP  Multiple Vitamin (MULTIVITAMIN) tablet Take 1 tablet by mouth daily.   Yes [provider]  NON FORMULARY Take 1 capsule by mouth daily. [address] MCT Oil   Yes [provider]  NON FORMULARY Take 1 tablet by mouth daily. Focus factor   Yes [provider]  Omega-3 Fatty Acids (FISH OIL OMEGA-3 PO) Take by mouth.   Yes [provider]  rosuvastatin (CRESTOR) 10 MG tablet Take 1 tablet by mouth once daily 10/25/22  Yes Gerardo Territo, Eilleen Kempf, MD  Vitamin D, Ergocalciferol, (DRISDOL) 1.25 MG (50000 UNIT) CAPS capsule TAKE 1 CAPSULE EVERY 7 DAYS 02/26/20  Yes Georgina Quint, MD    No Known Allergies  Patient Active Problem List   Diagnosis Date Noted   Sinus bradycardia on ECG 08/29/2020   Alzheimer's disease (HCC) 06/19/2020   Essential hypertension 06/19/2020   Diverticulosis 06/19/2020   Memory difficulty 02/19/2020   Vitamin D deficiency 12/21/2019   Dyslipidemia 12/21/2019    Past Medical History:  Diagnosis Date   Hyperlipidemia    Memory difficulty 02/19/2020    Past Surgical History:  Procedure Laterality Date   NO PAST  SURGERIES      Social History   Socioeconomic History   Marital status: Married    Spouse name: Not on file   Number of children: Not on file   Years of education: Not on file   Highest education level: Not on file  Occupational History   Not on file  Tobacco Use   Smoking status: Never   Smokeless tobacco: Never  Vaping Use   Vaping status: Never Used  Substance and Sexual Activity   Alcohol use: Yes    Alcohol/week: 21.0 standard drinks of alcohol     Types: 21 Shots of liquor per week    Comment: 3 drinks a day per pt   Drug use: Never   Sexual activity: Not on file  Other Topics Concern   Not on file  Social History Narrative   Lives with wife   Right handed   Drinks 1-2 cups caffeine daily   Social Drivers of Health   Financial Resource Strain: Low Risk  (08/12/2022)   Overall Financial Resource Strain (CARDIA)    Difficulty of Paying Living Expenses: Not hard at all  Food Insecurity: No Food Insecurity (08/12/2022)   Hunger Vital Sign    Worried About Running Out of Food in the Last Year: Never true    Ran Out of Food in the Last Year: Never true  Transportation Needs: No Transportation Needs (08/12/2022)   PRAPARE - Administrator, Civil Service (Medical): No    Lack of Transportation (Non-Medical): No  Physical Activity: Inactive (08/12/2022)   Exercise Vital Sign    Days of Exercise per Week: 0 days    Minutes of Exercise per Session: 0 min  Stress: No Stress Concern Present (08/12/2022)   [employer] of Occupational Health - Occupational Stress Questionnaire    Feeling of Stress : Not at all  Social Connections: Socially Integrated (08/12/2022)   Social Connection and Isolation Panel [NHANES]    Frequency of Communication with Friends and Family: More than three times a week    Frequency of Social Gatherings with Friends and Family: More than three times a week    Attends Religious Services: More than 4 times per year    Active Member of [employer] or Organizations: Yes    Attends Engineer, structural: More than 4 times per year    Marital Status: Married  Catering manager Violence: Not At Risk (08/12/2022)   Humiliation, Afraid, Rape, and Kick questionnaire    Fear of Current or Ex-Partner: No    Emotionally Abused: No    Physically Abused: No    Sexually Abused: No    Family History  Problem Relation Age of Onset   Stroke Mother    Heart disease Brother    Heart attack Brother    Ovarian  cancer Niece    Colon cancer Neg Hx    Colon polyps Neg Hx    Esophageal cancer Neg Hx    Rectal cancer Neg Hx    Stomach cancer Neg Hx      Review of Systems  Constitutional: Negative.  Negative for chills and fever.  HENT:  Positive for congestion. Negative for sore throat.   Respiratory:  Positive for cough.   Cardiovascular: Negative.  Negative for chest pain and palpitations.  Gastrointestinal:  Negative for abdominal pain, diarrhea, nausea and vomiting.  Genitourinary: Negative.  Negative for dysuria and hematuria.  Musculoskeletal: Negative.   Skin: Negative.  Negative for rash.  Neurological: Negative.  Negative for dizziness and headaches.  All other systems reviewed and are negative.   Vitals:   05/04/23 1049  BP: 126/62  Pulse: (!) 51  Temp: 98.4 F (36.9 C)  SpO2: 95%    Physical Exam Vitals reviewed.  Constitutional:      Appearance: Normal appearance.  HENT:     Head: Normocephalic.     Mouth/Throat:     Mouth: Mucous membranes are moist.     Pharynx: Oropharynx is clear.  Eyes:     Extraocular Movements: Extraocular movements intact.     Pupils: Pupils are equal, round, and reactive to light.  Cardiovascular:     Rate and Rhythm: Normal rate and regular rhythm.     Pulses: Normal pulses.     Heart sounds: Murmur heard.  Pulmonary:     Effort: Pulmonary effort is normal.     Breath sounds: Normal breath sounds.  Abdominal:     Palpations: Abdomen is soft.     Tenderness: There is no abdominal tenderness.  Musculoskeletal:     Cervical back: No tenderness.  Lymphadenopathy:     Cervical: No cervical adenopathy.  Skin:    General: Skin is warm and dry.     Capillary Refill: Capillary refill takes less than 2 seconds.  Neurological:     General: No focal deficit present.     Mental Status: He is alert and oriented to person, place, and time.  Psychiatric:        Mood and Affect: Mood normal.        Behavior: Behavior normal.       ASSESSMENT & PLAN: A total of 45 minutes was spent with the patient and counseling/coordination of care regarding preparing for this visit, review of most recent office visit notes, review of most recent neurologist office visit note, review of multiple chronic medical conditions and their management, review of all medications, review of most recent bloodwork results, review of health maintenance items, education on nutrition, prognosis, documentation, and need for follow up.   Problem List Items Addressed This Visit       Cardiovascular and Mediastinum   Essential hypertension - Primary   BP Readings from Last 3 Encounters:  05/04/23 126/62  11/03/22 112/60  08/12/22 118/60  Well-controlled hypertension Continue lisinopril 10 mg daily Cardiovascular risks associated with hypertension discussed Dietary approaches to stop hypertension discussed      Relevant Orders   Comprehensive metabolic panel   CBC with Differential/Platelet   Hemoglobin A1c   Lipid panel   TSH     Nervous and Auditory   Alzheimer's disease (HCC)   Stable chronic condition. Continues Namenda 5 mg twice a day Needs Follow-up with neurologist       Relevant Orders   Comprehensive metabolic panel   CBC with Differential/Platelet   Hemoglobin A1c   Lipid panel   TSH     Other   Dyslipidemia   Chronic stable condition Continues rosuvastatin 10 mg daily Diet and nutrition discussed      Relevant Orders   Comprehensive metabolic panel   CBC with Differential/Platelet   Hemoglobin A1c   Lipid panel   TSH   Sinus bradycardia on ECG   Asymptomatic and normotensive EKG still showing sinus bradycardia.  Unchanged from 1 year ago Stable.  No concerns.      Patient Instructions  Mantenimiento de la salud despus de los 65 aos de edad Health Maintenance After Age 68 Despus de los 65 aos de [place],  corre un riesgo mayor de Social worker e infecciones a Air cabin crew, como  tambin de sufrir lesiones por cadas. Las cadas son la causa principal de las fracturas de huesos y lesiones en la cabeza de personas mayores de 65 aos de edad. Recibir cuidados preventivos de forma regular puede ayudarlo a mantenerse saludable y en buen [place]. Los cuidados preventivos incluyen realizarse anlisis de forma regular y Forensic psychologist en el estilo de vida segn las recomendaciones del mdico. Converse con el mdico sobre lo siguiente: Las pruebas de deteccin y los anlisis que debe International aid/development worker. Una prueba de deteccin es un estudio que se para Engineer, manufacturing la presencia de una enfermedad cuando no tiene sntomas. Un plan de dieta y ejercicios adecuado para usted. Qu debo saber sobre las pruebas de deteccin y los anlisis para prevenir cadas? Realizarse pruebas de deteccin y [employer] es la mejor manera de Engineer, manufacturing un problema de salud de forma temprana. El diagnstico y tratamiento tempranos le brindan la mejor oportunidad de Chief Operating Officer las afecciones mdicas que son comunes despus de los 65 aos de edad. Ciertas afecciones y elecciones de estilo de vida pueden hacer que sea ms propenso a sufrir Engineer, site. El mdico puede recomendarle lo siguiente: Controles regulares de la visin. Una visin deficiente y afecciones como las cataratas pueden hacer que sea ms propenso a sufrir Engineer, site. Si [place] lentes, asegrese de obtener una receta actualizada si su visin cambia. Revisin de medicamentos. Revise regularmente con el mdico todos los medicamentos que toma, incluidos los medicamentos de [place]. Consulte al [employer] efectos secundarios que pueden hacer que sea ms propenso a sufrir Engineer, site. Informe al mdico si alguno de los medicamentos que toma lo hace sentir mareado o somnoliento. Controles de fuerza y equilibrio. El mdico puede recomendar ciertos estudios para controlar su fuerza y equilibrio al estar de pie, al caminar o al cambiar de posicin. Examen de los pies. El  dolor y Materials engineer en los pies, como tambin no utilizar el calzado [place], pueden hacer que sea ms propenso a sufrir Engineer, site. Pruebas de deteccin, que incluyen las siguientes: Pruebas de deteccin para la osteoporosis. La osteoporosis es una afeccin que hace que los huesos se tornen ms dbiles y se quiebren con ms facilidad. Pruebas de deteccin para la presin arterial. Los cambios en la presin arterial y los medicamentos para Chief Operating Officer la presin arterial pueden hacerlo sentir mareado. Prueba de deteccin de la depresin. Es ms probable que sufra una cada si tiene miedo a caerse, se siente deprimido o se siente incapaz de Probation officer. Prueba de deteccin de consumo de alcohol. Beber demasiado alcohol puede afectar su equilibrio y puede hacer que sea ms propenso a sufrir Engineer, site. Siga estas indicaciones en su casa: Estilo de vida No beba alcohol si: Su mdico le indica no hacerlo. Si bebe alcohol: Limite la cantidad que bebe a lo siguiente: De 0 a 1 medida por da para las mujeres. De 0 a 2 medidas por da para los hombres. Sepa cunta cantidad de alcohol hay en las bebidas que toma. En los [address], una medida equivale a una botella de cerveza de 12 oz (355 ml), un vaso de vino de 5 oz (148 ml) o un vaso de una bebida alcohlica de alta graduacin de 1 oz (44 ml). No consuma ningn producto que contenga nicotina o tabaco. Estos productos incluyen cigarrillos, tabaco para Theatre manager y aparatos de vapeo, como los Administrator, Civil Service. Si necesita ayuda  para dejar de consumir estos productos, consulte al [employer]. Actividad  Siga un programa de ejercicio regular para mantenerse en forma. Esto lo ayudar a Radio producer equilibrio. Consulte al mdico qu tipos de ejercicios son adecuados para usted. Si necesita un bastn o un andador, selo segn las recomendaciones del mdico. Utilice calzado con buen apoyo y suela  antideslizante. Seguridad  Retire los [employer] puedan causar tropiezos tales como alfombras, cables u obstculos. Instale equipos de seguridad, como barras para sostn en los baos y barandas de seguridad en las escaleras. Mantenga las habitaciones y los pasillos bien iluminados. Indicaciones generales Hable con el mdico sobre sus riesgos de sufrir una cada. Infrmele a su mdico si: Se cae. Asegrese de informarle a su mdico acerca de todas las cadas, incluso aquellas que parecen ser [employer]. Se siente mareado, cansado (tiene fatiga) o siente que pierde el equilibrio. Use los medicamentos de venta libre y los recetados solamente como se lo haya indicado el mdico. Estos incluyen suplementos. Siga una dieta sana y [place] un peso saludable. Una dieta saludable incluye productos lcteos descremados, carnes bajas en contenido de grasa ([place]), fibra de granos enteros, frijoles y [place] frutas y verduras. Mantngase al da con las vacunas. Realcese los estudios de rutina de la salud, dentales y de Wellsite geologist. Resumen Tener un estilo de vida saludable y recibir cuidados preventivos pueden ayudar a Research scientist (physical sciences) salud y el bienestar despus de los 65 aos de [place]. Realizarse pruebas de deteccin y [employer] es la mejor manera de Engineer, manufacturing un problema de salud de forma temprana y [place] a Automotive engineer una cada. El diagnstico y tratamiento tempranos le brindan la mejor oportunidad de Chief Operating Officer las afecciones mdicas ms comunes en las personas mayores de 65 aos de edad. Las cadas son la causa principal de las fracturas de huesos y lesiones en la cabeza de personas mayores de 65 aos de edad. Tome precauciones para evitar una cada en su casa. Trabaje con el mdico para saber qu cambios que puede hacer para mejorar su salud y [place], y para prevenir las cadas. Esta informacin no tiene Theme park manager el consejo del mdico. Asegrese de hacerle al mdico cualquier pregunta que  tenga. Document Revised: 07/24/2020 Document Reviewed: 07/24/2020 Elsevier Patient Education  2024 Elsevier Inc.     Edwina Barth, MD [facility]

## 2023-05-05 ENCOUNTER — Other Ambulatory Visit: Payer: Self-pay

## 2023-05-07 ENCOUNTER — Other Ambulatory Visit: Payer: Self-pay | Admitting: Neurology

## 2023-05-12 ENCOUNTER — Other Ambulatory Visit: Payer: Self-pay | Admitting: Anesthesiology

## 2023-06-02 NOTE — Progress Notes (Addendum)
 PATIENT: Eric Floyd DOB: [birth year]  REASON FOR VISIT: follow up for memory HISTORY FROM: patient, wife, interpreter  Primary Neurologist: Dr. Anne Floyd, will now be followed by Dr. Frances Floyd  HISTORY OF PRESENT ILLNESS: Today 06/03/23 MMSE 13/30. Went to [place] in Feb, going again in September. Mentions during travel in [place], he got lost in the airport going to the bathroom. Confused with time travel, showering 1 AM. Memory declining, bring his wife the wrong things. He sleeps at night, wakes up 3 times to use bathroom, takes a shower during the night, he is up and down, turns on the lights. Has urinary accidents at night. Showers independent. Puts denture cream in his hair. Mood is okay, his wife is very patient with him. He is wiping his nose during most visit today.   03/10/22 SS: Here with his wife, for last 6 months giving him [employer] Brain Oil, thinks making him stable. He will go the wrong place in his house, no falls. We reduced the dose of Namenda to 5 mg BID, he doesn't think dizzy, he hold walls less. Does his own ADLs. He doesn't want to exercise, has gained 8 lbs. Has a good appetite, sleeps good. Watches TV. Mood is good. Going to [place] next month. Feels like he is steadier.   Update 08/26/21 SS: Eric Floyd is here today for follow-up. On Namenda 10 mg BID. Doing well overall. Wife may note fidgety hands when sitting, If he stands up, may feel dizzy, seems to be since increasing Namenda. Often will reach for something to hold onto. They went to [place] last month, he did well. Rare alcohol use, only 1 beer a month. Is sedentary, doesn't do his walking anymore. Does his own ADLs. No falls.    Update 02/25/2021 SS: Mr. Eric Floyd here today for follow-up for Alzheimer's Disease. Last visit HR was 40, Aricept was stopped, was sent to cardiology. Determined no need for pacemaker, was not symptomatic. MMSE 14/30 today. Misplaces things, looks at old pictures, can't  remember the names. He has calmed down greatly, due to cutting back on alcohol. Likes to watch TV, is rather sedentary. Has increased appetite. Enjoyed watching the world cup. Sleeping well, does get up 2-3 times to urinate. He doesn't drive.  Wife does medications, has been taking Namenda 10 mg 1/2 tablet twice daily. Does own ADLs. They went to [place] in August.  Update 08/19/2020 SS: Mr. Eric Floyd is a 78 year old male with history of memory disturbance.  MRI of the brain showed generalized atrophy consistent with Alzheimer's disease, mild white matter changes. MMSE 16/30. Has trouble remembering where to put things, forgets what his wife send him for in the house. Not driving anymore. Wife manages his finances. Spends his day reading, watching TV. Takes naps during day, sometimes trouble sleeping at night if naps during day. At times gets upset at his wife, frustrated when she corrects him. He has 3 mixed drinks daily, more on the weekend. Has fallen out of bed twice. Lost 8 lbs since last seen. HR is noted to be 40 today, is asymptomatic.  HISTORY 02/19/2020 Dr. Anne Floyd: Mr. Eric Floyd is a 57 year old right-handed Hispanic male with a 1 year history of changes in memory.  He comes in with his wife today who indicates that she has noted some significant changes in memory for least a year or year and a half prior to this visit.  The wife noted that he was having some increasing problems  with driving, he was starting to get lost, she has not allowed him to drive over the last year.  She now has taken over the finances and is managing his medications over the last year, she also keeps up with the appointments.  The patient has short-term memory issues, he does have some problems with remembering names.  His older sister apparently also has memory problems.  The patient denies any headaches or dizziness, he denies numbness or weakness of the extremities or difficulty with balance or difficulty controlling the bowels or  the bladder.  He is sleeping fairly well, but he has a tendency to nap during the day and stay awake and watch TV at night.  He is sent to this office for further evaluation.  He has had recent blood work done that shows a normal B12 level.  REVIEW OF SYSTEMS: Out of a complete 14 system review of symptoms, the patient complains only of the following symptoms, and all other reviewed systems are negative.   See HPI  ALLERGIES: No Known Allergies  HOME MEDICATIONS: Outpatient Medications Prior to Visit  Medication Sig Dispense Refill   Ascorbic Acid (VITAMIN C) 100 MG tablet Take 500 mg by mouth daily.     Calcium Carb-Cholecalciferol (CALCIUM 600 + D PO) Take by mouth.     cetirizine (ZYRTEC) 10 MG tablet Take 10 mg by mouth daily.     COCONUT OIL PO Take by mouth. Once daily     Cyanocobalamin (VITAMIN B-12 PO) Take by mouth.     ipratropium (ATROVENT) 0.03 % nasal spray Place 2 sprays into both nostrils every 12 (twelve) hours. 30 mL 12   lisinopril (ZESTRIL) 10 MG tablet Take 1 tablet by mouth once daily 90 tablet 3   memantine (NAMENDA) 5 MG tablet Take 1 tablet by mouth twice daily 180 tablet 0   Multiple Vitamin (MULTIVITAMIN) tablet Take 1 tablet by mouth daily.     NON FORMULARY Take 1 capsule by mouth daily. [address] [address] MCT Oil     NON FORMULARY Take 1 tablet by mouth daily. Focus factor     Omega-3 Fatty Acids (FISH OIL OMEGA-3 PO) Take by mouth.     rosuvastatin (CRESTOR) 10 MG tablet Take 1 tablet by mouth once daily 90 tablet 3   Vitamin D, Ergocalciferol, (DRISDOL) 1.25 MG (50000 UNIT) CAPS capsule TAKE 1 CAPSULE EVERY 7 DAYS 7 capsule 0   No facility-administered medications prior to visit.    PAST MEDICAL HISTORY: Past Medical History:  Diagnosis Date   Hyperlipidemia    Memory difficulty 02/19/2020    PAST SURGICAL HISTORY: Past Surgical History:  Procedure Laterality Date   NO PAST SURGERIES      FAMILY HISTORY: Family History  Problem Relation Age of  Onset   Stroke Mother    Heart disease Brother    Heart attack Brother    Ovarian cancer Niece    Colon cancer Neg Hx    Colon polyps Neg Hx    Esophageal cancer Neg Hx    Rectal cancer Neg Hx    Stomach cancer Neg Hx     SOCIAL HISTORY: Social History   Socioeconomic History   Marital status: Married    Spouse name: Not on file   Number of children: Not on file   Years of education: Not on file   Highest education level: Not on file  Occupational History   Not on file  Tobacco Use   Smoking status: Never  Smokeless tobacco: Never  Vaping Use   Vaping status: Never Used  Substance and Sexual Activity   Alcohol use: Yes    Alcohol/week: 21.0 standard drinks of alcohol    Types: 21 Shots of liquor per week    Comment: 3 drinks a day per pt   Drug use: Never   Sexual activity: Not on file  Other Topics Concern   Not on file  Social History Narrative   Lives with wife   Right handed   Drinks 1-2 cups caffeine daily   Social Drivers of Health   Financial Resource Strain: Low Risk  (08/12/2022)   Overall Financial Resource Strain (CARDIA)    Difficulty of Paying Living Expenses: Not hard at all  Food Insecurity: No Food Insecurity (08/12/2022)   Hunger Vital Sign    Worried About Running Out of Food in the Last Year: Never true    Ran Out of Food in the Last Year: Never true  Transportation Needs: No Transportation Needs (08/12/2022)   PRAPARE - Administrator, Civil Service (Medical): No    Lack of Transportation (Non-Medical): No  Physical Activity: Inactive (08/12/2022)   Exercise Vital Sign    Days of Exercise per Week: 0 days    Minutes of Exercise per Session: 0 min  Stress: No Stress Concern Present (08/12/2022)   [employer] of Occupational Health - Occupational Stress Questionnaire    Feeling of Stress : Not at all  Social Connections: Socially Integrated (08/12/2022)   Social Connection and Isolation Panel [NHANES]    Frequency of  Communication with Friends and Family: More than three times a week    Frequency of Social Gatherings with Friends and Family: More than three times a week    Attends Religious Services: More than 4 times per year    Active Member of [employer] or Organizations: Yes    Attends Banker Meetings: More than 4 times per year    Marital Status: Married  Catering manager Violence: Not At Risk (08/12/2022)   Humiliation, Afraid, Rape, and Kick questionnaire    Fear of Current or Ex-Partner: No    Emotionally Abused: No    Physically Abused: No    Sexually Abused: No   PHYSICAL EXAM  Vitals:   06/03/23 1047  BP: 107/79  Resp: 16  Weight: 146 lb (66.2 kg)  Height: 5\' 4"  (1.626 m)     Body mass index is 25.06 kg/m.  Generalized: Well developed, in no acute distress     06/03/2023   10:53 AM 08/01/2021    4:55 PM 02/25/2021   10:58 AM  MMSE - Mini Mental State Exam  Not completed:  Unable to complete   Orientation to time 0  1  Orientation to Place 1  3  Registration 3  3  Attention/ Calculation 1  1  Recall 0  0  Language- name 2 objects 2  2  Language- repeat 1  0  Language- repeat-comments   unable to do.  Language- follow 3 step command 3  1  Language- read & follow direction 1  1  Write a sentence 1  1  Copy design 0  1  Total score 13  14   Neurological examination  Mentation: Alert, cooperative, smiling, history is mostly provided by his wife. Follows all commands speech and language fluent. Wipes his nose often Cranial nerve II-XII: Pupils were equal round reactive to light. Extraocular movements were full, visual field were full  on confrontational test. Facial sensation and strength were normal. Head turning and shoulder shrug  were normal and symmetric. Motor: The motor testing reveals 5 over 5 strength of all 4 extremities. Good symmetric motor tone is noted throughout.  Sensory: Sensory testing is intact to soft touch on all 4 extremities. No evidence of  extinction is noted.  Coordination: Cerebellar testing reveals good finger-nose-finger and heel-to-shin bilaterally.  Gait and station: Gait is normal, independent, steady  DIAGNOSTIC DATA (LABS, IMAGING, TESTING) - I reviewed patient records, labs, notes, testing and imaging myself where available.  Lab Results  Component Value Date   WBC 4.9 05/04/2023   HGB 14.2 05/04/2023   HCT 43.4 05/04/2023   MCV 95.4 05/04/2023   PLT 212.0 05/04/2023      Component Value Date/Time   NA 138 05/04/2023 1127   NA 140 12/20/2019 1159   K 4.1 05/04/2023 1127   CL 100 05/04/2023 1127   CO2 30 05/04/2023 1127   GLUCOSE 89 05/04/2023 1127   BUN 21 05/04/2023 1127   BUN 15 12/20/2019 1159   CREATININE 0.93 05/04/2023 1127   CALCIUM 9.4 05/04/2023 1127   PROT 8.0 05/04/2023 1127   PROT 7.7 12/20/2019 1159   ALBUMIN 4.2 05/04/2023 1127   ALBUMIN 4.5 12/20/2019 1159   AST 23 05/04/2023 1127   ALT 14 05/04/2023 1127   ALKPHOS 61 05/04/2023 1127   BILITOT 0.6 05/04/2023 1127   BILITOT 0.5 12/20/2019 1159   GFRNONAA 81 12/20/2019 1159   GFRAA 94 12/20/2019 1159   Lab Results  Component Value Date   CHOL 165 05/04/2023   HDL 55.90 05/04/2023   LDLCALC 93 05/04/2023   TRIG 78.0 05/04/2023   CHOLHDL 3 05/04/2023   Lab Results  Component Value Date   HGBA1C 5.7 05/04/2023   Lab Results  Component Value Date   VITAMINB12 1,337 (H) 12/20/2019   Lab Results  Component Value Date   TSH 1.81 05/04/2023   ASSESSMENT AND PLAN 78 y.o. year old male  has a past medical history of Hyperlipidemia and Memory difficulty (02/19/2020). here with:  1.  Alzheimer's disease, 2.  Bradycardia  -MMSE 13/30 -Continue Namenda 5 mg twice daily, full dosing resulted gait unsteadiness  -Holding Aricept due to bradycardia -Add trazodone working up to 50 mg at bedtime for insomnia, help with mood -Encouraged brain stimulating activity, physical exercise -Follow-up in 6 months or sooner if  needed  Addendum 06/10/23 SS: tried trazodone for 3 nights, did fine, then on the 4th night, episode of vomiting, felt dizzy, with a fall without injury, wife stopped trazodone.   Meds ordered this encounter  Medications   traZODone (DESYREL) 50 MG tablet    Sig: Take 1 tablet (50 mg total) by mouth at bedtime.    Dispense:  30 tablet    Refill:  5   memantine (NAMENDA) 5 MG tablet    Sig: Take 1 tablet (5 mg total) by mouth 2 (two) times daily.    Dispense:  180 tablet    Refill:  3   Margie Ege, [place], DNP 06/03/2023, 11:13 AM [facility] [address] [place], [place] [address] [phone]

## 2023-06-03 ENCOUNTER — Encounter: Payer: Self-pay | Admitting: Neurology

## 2023-06-03 ENCOUNTER — Ambulatory Visit: Payer: PPO | Admitting: Neurology

## 2023-06-03 VITALS — BP 107/79 | Resp 16 | Ht 64.0 in | Wt 146.0 lb

## 2023-06-03 DIAGNOSIS — R001 Bradycardia, unspecified: Secondary | ICD-10-CM

## 2023-06-03 DIAGNOSIS — F0280 Dementia in other diseases classified elsewhere without behavioral disturbance: Secondary | ICD-10-CM | POA: Diagnosis not present

## 2023-06-03 DIAGNOSIS — G309 Alzheimer's disease, unspecified: Secondary | ICD-10-CM | POA: Diagnosis not present

## 2023-06-03 MED ORDER — MEMANTINE HCL 5 MG PO TABS: 5.0000 mg | ORAL_TABLET | Freq: Two times a day (BID) | ORAL | 3 refills | Status: DC

## 2023-06-03 MED ORDER — TRAZODONE HCL 50 MG PO TABS: 50.0000 mg | ORAL_TABLET | Freq: Every day | ORAL | 5 refills | Status: DC

## 2023-06-03 NOTE — Patient Instructions (Addendum)
 Start taking trazodone 50 mg at bedtime, start giving him 1/2 tablet at bedtime for 2 weeks, can increase to 1 tablet  if needed to help with sleep  Continue namenda for memory   Continue exercise, brain stimulating activities   Meds ordered this encounter  Medications   traZODone (DESYREL) 50 MG tablet    Sig: Take 1 tablet (50 mg total) by mouth at bedtime.    Dispense:  30 tablet    Refill:  5   memantine (NAMENDA) 5 MG tablet    Sig: Take 1 tablet (5 mg total) by mouth 2 (two) times daily.    Dispense:  180 tablet    Refill:  3

## 2023-06-09 ENCOUNTER — Telehealth: Payer: Self-pay | Admitting: Neurology

## 2023-06-09 NOTE — Telephone Encounter (Signed)
 Wife reports giving the trazodone for 3 nights an everything was fine and the 4th night he starting vomiting and Monday night he fell from feeling dizziness. She has stopped giving the medicine. He has only been getting the 25 mg dose. He is not able to tolerate it and she doesn't want to try anything else at this time. No injuries from fall

## 2023-06-09 NOTE — Telephone Encounter (Signed)
 Wife is calling to report that she is no longer giving pt traZODone (DESYREL) 50 MG tablet because it has caused him to be dizzy and to fall.  Please call spouse to discuss.

## 2023-08-12 ENCOUNTER — Other Ambulatory Visit: Payer: Self-pay | Admitting: Emergency Medicine

## 2023-08-12 DIAGNOSIS — J3489 Other specified disorders of nose and nasal sinuses: Secondary | ICD-10-CM

## 2023-08-18 ENCOUNTER — Ambulatory Visit

## 2023-08-18 ENCOUNTER — Ambulatory Visit (INDEPENDENT_AMBULATORY_CARE_PROVIDER_SITE_OTHER): Payer: PPO

## 2023-08-18 VITALS — BP 118/68 | HR 45 | Ht 64.0 in | Wt 141.8 lb

## 2023-08-18 DIAGNOSIS — Z0000 Encounter for general adult medical examination without abnormal findings: Secondary | ICD-10-CM

## 2023-08-18 DIAGNOSIS — R001 Bradycardia, unspecified: Secondary | ICD-10-CM

## 2023-08-18 NOTE — Progress Notes (Unsigned)
 Patient presents in office today for AWV. Nurse noticed bradycardia, discussed with PCP and he gave verbals to perform EKG on patient. PCP reviewed EKG, patient made aware.

## 2023-08-18 NOTE — Addendum Note (Signed)
 Addended by: Maxine Spare on: 08/18/2023 05:03 PM   Modules accepted: Orders

## 2023-08-18 NOTE — Patient Instructions (Signed)
 Eric Floyd , Thank you for taking time out of your busy schedule to complete your Annual Wellness Visit with me. I enjoyed our conversation and look forward to speaking with you again next year. I, as well as your care team,  appreciate your ongoing commitment to your health goals. Please review the following plan we discussed and let me know if I can assist you in the future. Your Game plan/ To Do List   Follow up Visits: Next Medicare AWV with our clinical staff: 08/24/2024   Have you seen your provider in the last 6 months (3 months if uncontrolled diabetes)? No Next Office Visit with your provider: 11/2023  Clinician Recommendations:  Aim for 30 minutes of exercise or brisk walking, 6-8 glasses of water, and 5 servings of fruits and vegetables each day.       This is a list of the screening recommended for you and due dates:  Health Maintenance  Topic Date Due   COVID-19 Vaccine (3 - 2024-25 season) 11/01/2022   Flu Shot  10/01/2023   Medicare Annual Wellness Visit  08/17/2024   DTaP/Tdap/Td vaccine (2 - Td or Tdap) 02/22/2029   Pneumococcal Vaccine for age over 21  Completed   Hepatitis C Screening  Completed   Zoster (Shingles) Vaccine  Completed   HPV Vaccine  Aged Out   Meningitis B Vaccine  Aged Out   Colon Cancer Screening  Discontinued    Advanced directives: (Declined) Advance directive discussed with you today. Even though you declined this today, please call our office should you change your mind, and we can give you the proper paperwork for you to fill out. Advance Care Planning is important because it:  [x]  Makes sure you receive the medical care that is consistent with your values, goals, and preferences  [x]  It provides guidance to your family and loved ones and reduces their decisional burden about whether or not they are making the right decisions based on your wishes.  Follow the link provided in your after visit summary or read over the paperwork we have mailed to  you to help you started getting your Advance Directives in place. If you need assistance in completing these, please reach out to us  so that we can help you!

## 2023-08-18 NOTE — Progress Notes (Signed)
 Subjective:   Eric Floyd is a 78 y.o. who presents for a Medicare Wellness preventive visit.  As a reminder, Annual Wellness Visits don't include a physical exam, and some assessments may be limited, especially if this visit is performed virtually. We may recommend an in-person follow-up visit with your provider if needed.  Visit Complete: In person  Persons Participating in Visit: Patient. And Spouse, Audrea Blender.  AWV Questionnaire: No: Patient Medicare AWV questionnaire was not completed prior to this visit.  Cardiac Risk Factors include: advanced age (>66men, >24 women);hypertension;dyslipidemia;male gender     Objective:    Today's Vitals   08/18/23 1309  BP: 118/68  Pulse: (!) 45  SpO2: 96%  Weight: 141 lb 12.8 oz (64.3 kg)  Height: 5' 4 (1.626 m)   Body mass index is 24.34 kg/m.     08/18/2023    1:04 PM 08/12/2022    4:02 PM 08/01/2021    4:53 PM  Advanced Directives  Does Patient Have a Medical Advance Directive? No No No  Would patient like information on creating a medical advance directive? No - Patient declined No - Patient declined No - Patient declined    Current Medications (verified) Outpatient Encounter Medications as of 08/18/2023  Medication Sig   Ascorbic Acid (VITAMIN C) 100 MG tablet Take 500 mg by mouth daily.   Calcium  Carb-Cholecalciferol (CALCIUM  600 + D PO) Take by mouth.   cetirizine (ZYRTEC) 10 MG tablet Take 10 mg by mouth daily.   COCONUT OIL PO Take by mouth. Once daily   Cyanocobalamin (VITAMIN B-12 PO) Take by mouth.   ipratropium (ATROVENT ) 0.03 % nasal spray USE 2 SPRAY(S) IN EACH NOSTRIL EVERY 12 HOURS   lisinopril  (ZESTRIL ) 10 MG tablet Take 1 tablet by mouth once daily   memantine  (NAMENDA ) 5 MG tablet Take 1 tablet (5 mg total) by mouth 2 (two) times daily.   Multiple Vitamin (MULTIVITAMIN) tablet Take 1 tablet by mouth daily.   NON FORMULARY Take 1 capsule by mouth daily. [place] [address] MCT Oil   NON FORMULARY Take  1 tablet by mouth daily. Focus factor   Omega-3 Fatty Acids (FISH OIL OMEGA-3 PO) Take by mouth.   rosuvastatin  (CRESTOR ) 10 MG tablet Take 1 tablet by mouth once daily   Vitamin D , Ergocalciferol , (DRISDOL ) 1.25 MG (50000 UNIT) CAPS capsule TAKE 1 CAPSULE EVERY 7 DAYS   No facility-administered encounter medications on file as of 08/18/2023.    Allergies (verified) Patient has no known allergies.   History: Past Medical History:  Diagnosis Date   Hyperlipidemia    Memory difficulty 02/19/2020   Past Surgical History:  Procedure Laterality Date   NO PAST SURGERIES     Family History  Problem Relation Age of Onset   Stroke Mother    Heart disease Brother    Heart attack Brother    Ovarian cancer Niece    Colon cancer Neg Hx    Colon polyps Neg Hx    Esophageal cancer Neg Hx    Rectal cancer Neg Hx    Stomach cancer Neg Hx    Social History   Socioeconomic History   Marital status: Married    Spouse name: Not on file   Number of children: Not on file   Years of education: Not on file   Highest education level: Not on file  Occupational History   Not on file  Tobacco Use   Smoking status: Former    Current packs/day: 0.00  Types: Cigarettes    Quit date: 03/02/1984    Years since quitting: 39.4   Smokeless tobacco: Never  Vaping Use   Vaping status: Never Used  Substance and Sexual Activity   Alcohol use: Yes    Alcohol/week: 21.0 standard drinks of alcohol    Types: 21 Shots of liquor per week    Comment: 3 drinks a day per pt   Drug use: Never   Sexual activity: Not Currently  Other Topics Concern   Not on file  Social History Narrative   Lives with wife   Right handed   Drinks 1-2 cups caffeine daily   Social Drivers of Health   Financial Resource Strain: Low Risk  (08/18/2023)   Overall Financial Resource Strain (CARDIA)    Difficulty of Paying Living Expenses: Not hard at all  Food Insecurity: No Food Insecurity (08/18/2023)   Hunger Vital Sign     Worried About Running Out of Food in the Last Year: Never true    Ran Out of Food in the Last Year: Never true  Transportation Needs: No Transportation Needs (08/18/2023)   PRAPARE - Administrator, Civil Service (Medical): No    Lack of Transportation (Non-Medical): No  Physical Activity: Inactive (08/18/2023)   Exercise Vital Sign    Days of Exercise per Week: 0 days    Minutes of Exercise per Session: 0 min  Stress: No Stress Concern Present (08/18/2023)   [employer] of Occupational Health - Occupational Stress Questionnaire    Feeling of Stress: Not at all  Social Connections: Moderately Isolated (08/18/2023)   Social Connection and Isolation Panel    Frequency of Communication with Friends and Family: More than three times a week    Frequency of Social Gatherings with Friends and Family: Once a week    Attends Religious Services: Never    Database administrator or Organizations: No    Attends Engineer, structural: Never    Marital Status: Married    Tobacco Counseling Counseling given: Not Answered    Clinical Intake:  Pre-visit preparation completed: Yes  Pain : No/denies pain     BMI - recorded: 23.34 Nutritional Status: BMI of 19-24  Normal Nutritional Risks: None Diabetes: No  Lab Results  Component Value Date   HGBA1C 5.7 05/04/2023   HGBA1C 5.7 06/30/2022   HGBA1C 5.6 06/25/2021     How often do you need to have someone help you when you read instructions, pamphlets, or other written materials from your doctor or pharmacy?: 5 - Always (Spouse reads all info)  Interpreter Needed?: No  Comments: speaks Spanish Information entered by :: Kandy Orris, CMA   Activities of Daily Living     08/18/2023    1:17 PM  In your present state of health, do you have any difficulty performing the following activities:  Hearing? 0  Vision? 0  Difficulty concentrating or making decisions? 0  Walking or climbing stairs? 0  Dressing or  bathing? 0  Doing errands, shopping? 0  Preparing Food and eating ? N  Using the Toilet? N  In the past six months, have you accidently leaked urine? Y  Comment uses depends only at night  Do you have problems with loss of bowel control? N  Managing your Medications? N  Managing your Finances? N  Housekeeping or managing your Housekeeping? N    Patient Care Team: Elvira Hammersmith, MD as PCP - General (Internal Medicine) Wess Hammed, NP  as Nurse Practitioner (Neurology)  I have updated your Care Teams any recent Medical Services you may have received from other providers in the past year.     Assessment:   This is a routine wellness examination for Eric Floyd.  Hearing/Vision screen Hearing Screening - Comments:: Denies hearing difficulties   Vision Screening - Comments:: Denies vision concerns - declines an appt w/an Ophthalmologist    Goals Addressed               This Visit's Progress     Patient Stated (pt-stated)        Patient stated he continues to walk around the house but could walk more       Depression Screen     08/18/2023    1:32 PM 05/04/2023   10:57 AM 08/12/2022    4:00 PM 06/30/2022   10:57 AM 12/25/2021   11:06 AM 08/01/2021    4:51 PM 06/25/2021    9:20 AM  PHQ 2/9 Scores  PHQ - 2 Score 0 0 0 0 0 0 0  PHQ- 9 Score 1  5    4     Fall Risk     08/18/2023    1:21 PM 05/04/2023   10:57 AM 08/12/2022    4:34 PM 06/30/2022   10:57 AM 12/25/2021   11:06 AM  Fall Risk   Falls in the past year? 0 0 0 0 0  Number falls in past yr: 0 0 0 0 0  Injury with Fall? 0 0 0 0 0  Risk for fall due to : No Fall Risks No Fall Risks No Fall Risks No Fall Risks No Fall Risks  Follow up Falls evaluation completed;Falls prevention discussed Falls evaluation completed Falls prevention discussed Falls evaluation completed Falls evaluation completed      Data saved with a previous flowsheet row definition    MEDICARE RISK AT HOME:  Medicare Risk at Home Any stairs  in or around the home?: Yes If so, are there any without handrails?: No Home free of loose throw rugs in walkways, pet beds, electrical cords, etc?: Yes Adequate lighting in your home to reduce risk of falls?: Yes Life alert?: No Use of a cane, walker or w/c?: No Grab bars in the bathroom?: No Shower chair or bench in shower?: No Elevated toilet seat or a handicapped toilet?: No  TIMED UP AND GO:  Was the test performed?  No  Cognitive Function: 6CIT completed    06/03/2023   10:53 AM 08/01/2021    4:55 PM 02/25/2021   10:58 AM 08/19/2020    2:57 PM 02/19/2020    3:16 PM  MMSE - Mini Mental State Exam  Not completed:  Unable to complete     Orientation to time 0  1 1 1   Orientation to Place 1  3 3 3   Registration 3  3 3 3   Attention/ Calculation 1  1 1 3   Recall 0  0 0 0  Language- name 2 objects 2  2 2 2   Language- repeat 1  0 0 0  Language- repeat-comments   unable to do. UTO- language barrier   Language- follow 3 step command 3  1 3 3   Language- read & follow direction 1  1 1 1   Write a sentence 1  1 1 1   Copy design 0  1 1 1   Total score 13  14 16 18         08/18/2023    1:27 PM  6CIT Screen  What Year? 4 points  What month? 3 points  What time? 0 points  Count back from 20 0 points  Months in reverse 4 points  Repeat phrase 10 points  Total Score 21 points    Immunizations Immunization History  Administered Date(s) Administered   Fluad Quad(high Dose 65+) 12/20/2019, 12/25/2021   Fluad Trivalent(High Dose 65+) 11/03/2022   PFIZER(Purple Top)SARS-COV-2 Vaccination 05/13/2019, 06/03/2019   Pneumococcal Conjugate-13 07/17/2015   Pneumococcal Polysaccharide-23 09/10/2016   Tdap 02/23/2019   Zoster Recombinant(Shingrix) 09/19/2015, 09/18/2016   Zoster, Live 07/17/2015    Screening Tests Health Maintenance  Topic Date Due   COVID-19 Vaccine (3 - 2024-25 season) 11/01/2022   INFLUENZA VACCINE  10/01/2023   Medicare Annual Wellness (AWV)  08/17/2024    DTaP/Tdap/Td (2 - Td or Tdap) 02/22/2029   Pneumococcal Vaccine: 50+ Years  Completed   Hepatitis C Screening  Completed   Zoster Vaccines- Shingrix  Completed   HPV VACCINES  Aged Out   Meningococcal B Vaccine  Aged Out   Colonoscopy  Discontinued    Health Maintenance  Health Maintenance Due  Topic Date Due   COVID-19 Vaccine (3 - 2024-25 season) 11/01/2022   Health Maintenance Items Addressed:  Due to pt's Heart rate = 44-45, after consulting with Dr Vedia Geralds an EKG was performed and results will be reviewed with instructions given to the patient/Spouse.  Additional Screening:  Vision Screening: Recommended annual ophthalmology exams for early detection of glaucoma and other disorders of the eye. Would you like a referral to an eye doctor? No  Pt declined a referral for an eye exam.  Dental Screening: Recommended annual dental exams for proper oral hygiene  Community Resource Referral / Chronic Care Management: CRR required this visit?  No   CCM required this visit?  No   Plan:    I have personally reviewed and noted the following in the patient's chart:   Medical and social history Use of alcohol, tobacco or illicit drugs  Current medications and supplements including opioid prescriptions. Patient is not currently taking opioid prescriptions. Functional ability and status Nutritional status Physical activity Advanced directives List of other physicians Hospitalizations, surgeries, and ER visits in previous 12 months Vitals Screenings to include cognitive, depression, and falls Referrals and appointments  In addition, I have reviewed and discussed with patient certain preventive protocols, quality metrics, and best practice recommendations. A written personalized care plan for preventive services as well as general preventive health recommendations were provided to patient.   Patria Bookbinder, CMA   08/18/2023   After Visit Summary: (In Person-Printed) AVS  printed and given to the patient  Notes: EKG ordered by PCP due to low heart rate (sinus bradycardia) > pt's next appt is 10/2024

## 2023-10-02 ENCOUNTER — Other Ambulatory Visit: Payer: Self-pay | Admitting: Emergency Medicine

## 2023-10-02 DIAGNOSIS — J3489 Other specified disorders of nose and nasal sinuses: Secondary | ICD-10-CM

## 2023-10-05 ENCOUNTER — Other Ambulatory Visit: Payer: Self-pay | Admitting: Emergency Medicine

## 2023-10-05 DIAGNOSIS — I10 Essential (primary) hypertension: Secondary | ICD-10-CM

## 2023-10-05 DIAGNOSIS — E785 Hyperlipidemia, unspecified: Secondary | ICD-10-CM

## 2023-11-29 ENCOUNTER — Encounter: Payer: Self-pay | Admitting: Emergency Medicine

## 2023-11-29 ENCOUNTER — Ambulatory Visit: Admitting: Emergency Medicine

## 2023-11-29 VITALS — BP 122/72 | HR 40 | Temp 97.7°F | Ht 64.0 in | Wt 138.0 lb

## 2023-11-29 DIAGNOSIS — G309 Alzheimer's disease, unspecified: Secondary | ICD-10-CM

## 2023-11-29 DIAGNOSIS — Z23 Encounter for immunization: Secondary | ICD-10-CM

## 2023-11-29 DIAGNOSIS — R001 Bradycardia, unspecified: Secondary | ICD-10-CM | POA: Diagnosis not present

## 2023-11-29 DIAGNOSIS — I10 Essential (primary) hypertension: Secondary | ICD-10-CM | POA: Diagnosis not present

## 2023-11-29 DIAGNOSIS — E785 Hyperlipidemia, unspecified: Secondary | ICD-10-CM | POA: Diagnosis not present

## 2023-11-29 DIAGNOSIS — F0280 Dementia in other diseases classified elsewhere without behavioral disturbance: Secondary | ICD-10-CM | POA: Diagnosis not present

## 2023-11-29 DIAGNOSIS — F5104 Psychophysiologic insomnia: Secondary | ICD-10-CM | POA: Insufficient documentation

## 2023-11-29 MED ORDER — HYDROXYZINE HCL 25 MG PO TABS: 25.0000 mg | ORAL_TABLET | Freq: Every evening | ORAL | 3 refills | Status: AC | PRN

## 2023-11-29 NOTE — Addendum Note (Signed)
 Addended by: PURCELL EMIL PARAS on: 11/29/2023 11:44 AM   Modules accepted: Level of Service

## 2023-11-29 NOTE — Assessment & Plan Note (Signed)
 History of Alzheimer's dementia Did not tolerate trial of trazodone  Recommend Vistaril 25 mg at bedtime

## 2023-11-29 NOTE — Patient Instructions (Signed)
 Mantenimiento de la salud despus de los 65 aos de edad Health Maintenance After Age 77 Despus de los 65 aos de edad, corre un riesgo mayor de Film/video editor enfermedades e infecciones a largo plazo, como tambin de sufrir lesiones por cadas. Las cadas son la causa principal de las fracturas de huesos y lesiones en la cabeza de personas mayores de 65 aos de edad. Recibir cuidados preventivos de forma regular puede ayudarlo a mantenerse saludable y en buen [place]. Los cuidados preventivos incluyen realizarse anlisis de forma regular y Forensic psychologist en el estilo de vida segn las recomendaciones del mdico. Converse con el mdico sobre lo siguiente: Las pruebas de deteccin y los anlisis que debe International aid/development worker. Una prueba de deteccin es un estudio que se para Engineer, manufacturing la presencia de una enfermedad cuando no tiene sntomas. Un plan de dieta y ejercicios adecuado para usted. Qu debo saber sobre las pruebas de deteccin y los anlisis para prevenir cadas? Realizarse pruebas de deteccin y [employer] es la mejor manera de Engineer, manufacturing un problema de salud de forma temprana. El diagnstico y tratamiento tempranos le brindan la mejor oportunidad de Chief Operating Officer las afecciones mdicas que son comunes despus de los 65 aos de edad. Ciertas afecciones y elecciones de estilo de vida pueden hacer que sea ms propenso a sufrir Engineer, site. El mdico puede recomendarle lo siguiente: Controles regulares de la visin. Una visin deficiente y afecciones como las cataratas pueden hacer que sea ms propenso a sufrir Engineer, site. Si usa  lentes, asegrese de obtener una receta actualizada si su visin cambia. Revisin de medicamentos. Revise regularmente con el mdico todos los medicamentos que toma, incluidos los medicamentos de [place]. Consulte al [employer] efectos secundarios que pueden hacer que sea ms propenso a sufrir Engineer, site. Informe al mdico si alguno de los medicamentos que toma lo hace sentir mareado o  somnoliento. Controles de fuerza y equilibrio. El mdico puede recomendar ciertos estudios para controlar su fuerza y equilibrio al estar de pie, al caminar o al cambiar de posicin. Examen de los pies. El dolor y Materials engineer en los pies, como tambin no utilizar el calzado adecuado, pueden hacer que sea ms propenso a sufrir Engineer, site. Pruebas de deteccin, que incluyen las siguientes: Pruebas de deteccin para la osteoporosis. La osteoporosis es una afeccin que hace que los huesos se tornen ms dbiles y se quiebren con ms facilidad. Pruebas de deteccin para la presin arterial. Los cambios en la presin arterial y los medicamentos para Chief Operating Officer la presin arterial pueden hacerlo sentir mareado. Prueba de deteccin de la depresin. Es ms probable que sufra una cada si tiene miedo a caerse, se siente deprimido o se siente incapaz de Probation officer. Prueba de deteccin de consumo de alcohol. Beber demasiado alcohol puede afectar su equilibrio y puede hacer que sea ms propenso a sufrir Engineer, site. Siga estas indicaciones en su casa: Estilo de vida No beba alcohol si: Su mdico le indica no hacerlo. Si bebe alcohol: Limite la cantidad que bebe a lo siguiente: De 0 a 1 medida por da para las mujeres. De 0 a 2 medidas por da para los hombres. Sepa cunta cantidad de alcohol hay en las bebidas que toma. En los [address], una medida equivale a una botella de cerveza de 12 oz (355 ml), un vaso de vino de 5 oz (148 ml) o un vaso de una bebida alcohlica de alta graduacin de 1 oz (44 ml). No consuma ningn producto que  contenga nicotina o tabaco. Estos productos incluyen cigarrillos, tabaco para mascar y aparatos de vapeo, como los cigarrillos electrnicos. Si necesita ayuda para dejar de consumir estos productos, consulte al [employer]. Actividad  Siga un programa de ejercicio regular para mantenerse en forma. Esto lo ayudar a Radio producer equilibrio. Consulte al  mdico qu tipos de ejercicios son adecuados para usted. Si necesita un bastn o un andador, selo segn las recomendaciones del mdico. Utilice calzado con buen apoyo y suela antideslizante. Seguridad  [employer] los [employer] puedan causar tropiezos tales como alfombras, cables u obstculos. Instale equipos de seguridad, como barras para sostn en los baos y barandas de seguridad en las escaleras. Mantenga las habitaciones y los pasillos bien iluminados. Indicaciones generales Hable con el mdico sobre sus riesgos de sufrir una cada. Infrmele a su mdico si: Se cae. Asegrese de informarle a su mdico acerca de todas las cadas, incluso aquellas que parecen ser [employer]. Se siente mareado, cansado (tiene fatiga) o siente que pierde el equilibrio. Use los medicamentos de venta libre y los recetados solamente como se lo haya indicado el mdico. Estos incluyen suplementos. Siga una dieta sana y [place] un peso saludable. Una dieta saludable incluye productos lcteos descremados, carnes bajas en contenido de grasa ([place]), fibra de granos enteros, frijoles y [place] frutas y verduras. Mantngase al da con las vacunas. Realcese los estudios de rutina de la salud, dentales y de Wellsite geologist. Resumen Tener un estilo de vida saludable y recibir cuidados preventivos pueden ayudar a Research scientist (physical sciences) salud y el bienestar despus de los 65 aos de [place]. Realizarse pruebas de deteccin y anlisis es la mejor manera de Engineer, manufacturing un problema de salud de forma temprana y ayudarlo a Automotive engineer una cada. El diagnstico y tratamiento tempranos le brindan la mejor oportunidad de Chief Operating Officer las afecciones mdicas ms comunes en las personas mayores de 65 aos de edad. Las cadas son la causa principal de las fracturas de huesos y lesiones en la cabeza de personas mayores de 65 aos de edad. Tome precauciones para evitar una cada en su casa. Trabaje con el mdico para saber qu cambios que puede hacer para mejorar su salud y  [place], y para prevenir las cadas. Esta informacin no tiene Theme park manager el consejo del mdico. Asegrese de hacerle al mdico cualquier pregunta que tenga. Document Revised: 07/24/2020 Document Reviewed: 07/24/2020 Elsevier Patient Education  2024 [employer].

## 2023-11-29 NOTE — Assessment & Plan Note (Signed)
 BP Readings from Last 3 Encounters:  11/29/23 122/72  08/18/23 118/68  06/03/23 107/79  Well-controlled hypertension Continue lisinopril  10 mg daily Cardiovascular risks associated with hypertension discussed Dietary approaches to stop hypertension discussed

## 2023-11-29 NOTE — Progress Notes (Signed)
 Rossi Burdo Harshman 78 y.o.   Chief Complaint  Patient presents with   Medical Management of Chronic Issues    6 month f/u    HISTORY OF PRESENT ILLNESS: This is a 78 y.o. male here for follow-up of multiple chronic medical conditions including hypertension Accompanied by wife today.  Also has history of Alzheimer's Chronic insomnia.  Was recently tried on trazodone  but became too dizzy and fell.  Did not tolerate well. Looking for another alternative. Otherwise doing well.  No other complaints or medical concerns today.  HPI   Prior to Admission medications   Medication Sig Start Date End Date Taking? Authorizing Provider  Ascorbic Acid (VITAMIN C) 100 MG tablet Take 500 mg by mouth daily.   Yes [provider]  Calcium  Carb-Cholecalciferol (CALCIUM  600 + D PO) Take by mouth.   Yes [provider]  cetirizine (ZYRTEC) 10 MG tablet Take 10 mg by mouth daily.   Yes [provider]  COCONUT OIL PO Take by mouth. Once daily   Yes [provider]  Cyanocobalamin (VITAMIN B-12 PO) Take by mouth.   Yes [provider]  hydrOXYzine (ATARAX) 25 MG tablet Take 1 tablet (25 mg total) by mouth at bedtime as needed. 11/29/23  Yes Kyisha Fowle Jose, MD  ipratropium (ATROVENT ) 0.03 % nasal spray USE 2 SPRAY(S) IN EACH NOSTRIL EVERY 12 HOURS 10/03/23  Yes Aimar Borghi, Emil Schanz, MD  lisinopril  (ZESTRIL ) 10 MG tablet Take 1 tablet by mouth once daily 10/05/23  Yes Brinden Kincheloe, Emil Schanz, MD  memantine  (NAMENDA ) 5 MG tablet Take 1 tablet (5 mg total) by mouth 2 (two) times daily. 06/03/23  Yes Gayland Lauraine PARAS, NP  Multiple Vitamin (MULTIVITAMIN) tablet Take 1 tablet by mouth daily.   Yes [provider]  NON FORMULARY Take 1 capsule by mouth daily. [address] [address] MCT Oil   Yes [provider]  NON FORMULARY Take 1 tablet by mouth daily. Focus factor   Yes [provider]  Omega-3 Fatty Acids (FISH OIL OMEGA-3 PO) Take by mouth.   Yes  [provider]  rosuvastatin  (CRESTOR ) 10 MG tablet Take 1 tablet by mouth once daily 10/05/23  Yes Karolee Meloni, Maynard, MD  Vitamin D , Ergocalciferol , (DRISDOL ) 1.25 MG (50000 UNIT) CAPS capsule TAKE 1 CAPSULE EVERY 7 DAYS 02/26/20  Yes [place] Emil Schanz, MD    No Known Allergies  Patient Active Problem List   Diagnosis Date Noted   Sinus bradycardia on ECG 08/29/2020   Alzheimer's disease (HCC) 06/19/2020   Essential hypertension 06/19/2020   Diverticulosis 06/19/2020   Memory difficulty 02/19/2020   Vitamin D  deficiency 12/21/2019   Dyslipidemia 12/21/2019    Past Medical History:  Diagnosis Date   Hyperlipidemia    Memory difficulty 02/19/2020    Past Surgical History:  Procedure Laterality Date   NO PAST SURGERIES      Social History   Socioeconomic History   Marital status: Married    Spouse name: Not on file   Number of children: Not on file   Years of education: Not on file   Highest education level: Not on file  Occupational History   Not on file  Tobacco Use   Smoking status: Former    Current packs/day: 0.00    Types: Cigarettes    Quit date: 03/02/1984    Years since quitting: 39.7   Smokeless tobacco: Never  Vaping Use   Vaping status: Never Used  Substance and Sexual Activity   Alcohol  use: Yes    Alcohol/week: 21.0 standard drinks of alcohol    Types: 21 Shots of liquor per week    Comment: 3 drinks a day per pt   Drug use: Never   Sexual activity: Not Currently  Other Topics Concern   Not on file  Social History Narrative   Lives with wife   Right handed   Drinks 1-2 cups caffeine daily   Social Drivers of Health   Financial Resource Strain: Low Risk  (08/18/2023)   Overall Financial Resource Strain (CARDIA)    Difficulty of Paying Living Expenses: Not hard at all  Food Insecurity: No Food Insecurity (08/18/2023)   Hunger Vital Sign    Worried About Running Out of Food in the Last Year: Never true    Ran Out of Food in  the Last Year: Never true  Transportation Needs: No Transportation Needs (08/18/2023)   PRAPARE - Administrator, Civil Service (Medical): No    Lack of Transportation (Non-Medical): No  Physical Activity: Inactive (08/18/2023)   Exercise Vital Sign    Days of Exercise per Week: 0 days    Minutes of Exercise per Session: 0 min  Stress: No Stress Concern Present (08/18/2023)   [employer] of Occupational Health - Occupational Stress Questionnaire    Feeling of Stress: Not at all  Social Connections: Moderately Isolated (08/18/2023)   Social Connection and Isolation Panel    Frequency of Communication with Friends and Family: More than three times a week    Frequency of Social Gatherings with Friends and Family: Once a week    Attends Religious Services: Never    Database administrator or Organizations: No    Attends Banker Meetings: Never    Marital Status: Married  Catering manager Violence: Not At Risk (08/18/2023)   Humiliation, Afraid, Rape, and Kick questionnaire    Fear of Current or Ex-Partner: No    Emotionally Abused: No    Physically Abused: No    Sexually Abused: No    Family History  Problem Relation Age of Onset   Stroke Mother    Heart disease Brother    Heart attack Brother    Ovarian cancer Niece    Colon cancer Neg Hx    Colon polyps Neg Hx    Esophageal cancer Neg Hx    Rectal cancer Neg Hx    Stomach cancer Neg Hx      Review of Systems  Constitutional: Negative.  Negative for chills and fever.  HENT: Negative.  Negative for congestion and sore throat.   Respiratory: Negative.  Negative for cough and shortness of breath.   Cardiovascular: Negative.  Negative for chest pain and palpitations.  Gastrointestinal:  Negative for abdominal pain, diarrhea, nausea and vomiting.  Genitourinary: Negative.  Negative for dysuria and hematuria.  Skin: Negative.  Negative for rash.  Neurological: Negative.  Negative for dizziness and  headaches.  Psychiatric/Behavioral:  The patient has insomnia.   All other systems reviewed and are negative.   Vitals:   11/29/23 0946  BP: 122/72  Pulse: (!) 40  Temp: 97.7 F (36.5 C)  SpO2: 99%    Physical Exam Vitals reviewed.  Constitutional:      Appearance: Normal appearance.  HENT:     Head: Normocephalic.     Mouth/Throat:     Mouth: Mucous membranes are moist.     Pharynx: Oropharynx is clear.  Eyes:     Extraocular Movements: Extraocular movements  intact.     Conjunctiva/sclera: Conjunctivae normal.     Pupils: Pupils are equal, round, and reactive to light.  Cardiovascular:     Rate and Rhythm: Regular rhythm. Bradycardia present.     Pulses: Normal pulses.     Heart sounds: Normal heart sounds.  Pulmonary:     Effort: Pulmonary effort is normal.     Breath sounds: Normal breath sounds.  Abdominal:     Palpations: Abdomen is soft.     Tenderness: There is no abdominal tenderness.  Musculoskeletal:     Cervical back: No tenderness.     Right lower leg: No edema.     Left lower leg: Edema present.  Lymphadenopathy:     Cervical: No cervical adenopathy.  Skin:    General: Skin is warm and dry.  Neurological:     Mental Status: He is alert. Mental status is at baseline.  Psychiatric:        Mood and Affect: Mood normal.        Behavior: Behavior normal.      ASSESSMENT & PLAN: A total of 42 minutes was spent with the patient and counseling/coordination of care regarding preparing for this visit, review of most recent office visit notes, review of multiple chronic medical conditions and their management, review of all medications, review of most recent bloodwork results, review of health maintenance items, education on nutrition, prognosis, documentation, and need for follow up.   Problem List Items Addressed This Visit       Cardiovascular and Mediastinum   Essential hypertension - Primary   BP Readings from Last 3 Encounters:  11/29/23 122/72   08/18/23 118/68  06/03/23 107/79  Well-controlled hypertension Continue lisinopril  10 mg daily Cardiovascular risks associated with hypertension discussed Dietary approaches to stop hypertension discussed         Nervous and Auditory   Alzheimer's disease (HCC)   Stable chronic condition. Continues Namenda  5 mg twice a day Was able to follow-up with neurologist on 06/03/2023 Office notes reviewed      Relevant Medications   hydrOXYzine (ATARAX) 25 MG tablet     Other   Dyslipidemia   Chronic stable condition Continues rosuvastatin  10 mg daily Diet and nutrition discussed      Sinus bradycardia on ECG   Asymptomatic and normotensive EKG still showing sinus bradycardia.  Unchanged from 1 year ago Stable.  No concerns.      Chronic insomnia   History of Alzheimer's dementia Did not tolerate trial of trazodone  Recommend Vistaril 25 mg at bedtime      Relevant Medications   hydrOXYzine (ATARAX) 25 MG tablet   Other Visit Diagnoses       Need for influenza vaccination       Relevant Orders   Flu vaccine HIGH DOSE PF(Fluzone Trivalent) (Completed)      Patient Instructions  Mantenimiento de la salud despus de los 65 aos de edad Health Maintenance After Age 7 Despus de los 65 aos de edad, corre un riesgo mayor de Film/video editor enfermedades e infecciones a largo plazo, como tambin de sufrir lesiones por cadas. Las cadas son la causa principal de las fracturas de huesos y lesiones en la cabeza de personas mayores de 65 aos de edad. Recibir cuidados preventivos de forma regular puede ayudarlo a mantenerse saludable y en buen [place]. Los cuidados preventivos incluyen realizarse anlisis de forma regular y Forensic psychologist en el estilo de vida segn las recomendaciones del mdico. [place] con el mdico sobre  lo siguiente: Las pruebas de deteccin y los anlisis que debe International aid/development worker. Una prueba de deteccin es un estudio que se para Engineer, manufacturing la presencia de una  enfermedad cuando no tiene sntomas. Un plan de dieta y ejercicios adecuado para usted. Qu debo saber sobre las pruebas de deteccin y los anlisis para prevenir cadas? Realizarse pruebas de deteccin y [employer] es la mejor manera de Engineer, manufacturing un problema de salud de forma temprana. El diagnstico y tratamiento tempranos le brindan la mejor oportunidad de Chief Operating Officer las afecciones mdicas que son comunes despus de los 65 aos de edad. Ciertas afecciones y elecciones de estilo de vida pueden hacer que sea ms propenso a sufrir Engineer, site. El mdico puede recomendarle lo siguiente: Controles regulares de la visin. Una visin deficiente y afecciones como las cataratas pueden hacer que sea ms propenso a sufrir Engineer, site. Si usa  lentes, asegrese de obtener una receta actualizada si su visin cambia. Revisin de medicamentos. Revise regularmente con el mdico todos los medicamentos que toma, incluidos los medicamentos de [place]. Consulte al [employer] efectos secundarios que pueden hacer que sea ms propenso a sufrir Engineer, site. Informe al mdico si alguno de los medicamentos que toma lo hace sentir mareado o somnoliento. Controles de fuerza y equilibrio. El mdico puede recomendar ciertos estudios para controlar su fuerza y equilibrio al estar de pie, al caminar o al cambiar de posicin. Examen de los pies. El dolor y Materials engineer en los pies, como tambin no utilizar el calzado adecuado, pueden hacer que sea ms propenso a sufrir Engineer, site. Pruebas de deteccin, que incluyen las siguientes: Pruebas de deteccin para la osteoporosis. La osteoporosis es una afeccin que hace que los huesos se tornen ms dbiles y se quiebren con ms facilidad. Pruebas de deteccin para la presin arterial. Los cambios en la presin arterial y los medicamentos para Chief Operating Officer la presin arterial pueden hacerlo sentir mareado. Prueba de deteccin de la depresin. Es ms probable que sufra una cada si tiene  miedo a caerse, se siente deprimido o se siente incapaz de Probation officer. Prueba de deteccin de consumo de alcohol. Beber demasiado alcohol puede afectar su equilibrio y puede hacer que sea ms propenso a sufrir Engineer, site. Siga estas indicaciones en su casa: Estilo de vida No beba alcohol si: Su mdico le indica no hacerlo. Si bebe alcohol: Limite la cantidad que bebe a lo siguiente: De 0 a 1 medida por da para las mujeres. De 0 a 2 medidas por da para los hombres. Sepa cunta cantidad de alcohol hay en las bebidas que toma. En los [address], una medida equivale a una botella de cerveza de 12 oz (355 ml), un vaso de vino de 5 oz (148 ml) o un vaso de una bebida alcohlica de alta graduacin de 1 oz (44 ml). No consuma ningn producto que contenga nicotina o tabaco. Estos productos incluyen cigarrillos, tabaco para Theatre manager y aparatos de vapeo, como los cigarrillos electrnicos. Si necesita ayuda para dejar de consumir estos productos, consulte al [employer]. Actividad  Siga un programa de ejercicio regular para mantenerse en forma. Esto lo ayudar a Radio producer equilibrio. Consulte al mdico qu tipos de ejercicios son adecuados para usted. Si necesita un bastn o un andador, selo segn las recomendaciones del mdico. Utilice calzado con buen apoyo y suela antideslizante. Seguridad  Retire los [employer] puedan causar tropiezos tales como alfombras, cables u obstculos. Instale equipos de seguridad, como barras para [employer]  y barandas de seguridad en las escaleras. Mantenga las habitaciones y los pasillos bien iluminados. Indicaciones generales Hable con el mdico sobre sus riesgos de sufrir una cada. Infrmele a su mdico si: Se cae. Asegrese de informarle a su mdico acerca de todas las cadas, incluso aquellas que parecen ser [employer]. Se siente mareado, cansado (tiene fatiga) o siente que pierde el equilibrio. Use los medicamentos de venta libre  y los recetados solamente como se lo haya indicado el mdico. Estos incluyen suplementos. Siga una dieta sana y [place] un peso saludable. Una dieta saludable incluye productos lcteos descremados, carnes bajas en contenido de grasa (Verlot), fibra de granos enteros, frijoles y [place] frutas y verduras. Mantngase al da con las vacunas. Realcese los estudios de rutina de la salud, dentales y de Wellsite geologist. Resumen Tener un estilo de vida saludable y recibir cuidados preventivos pueden ayudar a Research scientist (physical sciences) salud y el bienestar despus de los 65 aos de [place]. Realizarse pruebas de deteccin y anlisis es la mejor manera de Engineer, manufacturing un problema de salud de forma temprana y ayudarlo a Automotive engineer una cada. El diagnstico y tratamiento tempranos le brindan la mejor oportunidad de Chief Operating Officer las afecciones mdicas ms comunes en las personas mayores de 65 aos de edad. Las cadas son la causa principal de las fracturas de huesos y lesiones en la cabeza de personas mayores de 65 aos de edad. Tome precauciones para evitar una cada en su casa. Trabaje con el mdico para saber qu cambios que puede hacer para mejorar su salud y [place], y para prevenir las cadas. Esta informacin no tiene Theme park manager el consejo del mdico. Asegrese de hacerle al mdico cualquier pregunta que tenga. Document Revised: 07/24/2020 Document Reviewed: 07/24/2020 Elsevier Patient Education  2024 Elsevier Inc.      Emil Schaumann, MD [facility]

## 2023-11-29 NOTE — Assessment & Plan Note (Signed)
 Stable chronic condition. Continues Namenda  5 mg twice a day Was able to follow-up with neurologist on 06/03/2023 Office notes reviewed

## 2023-11-29 NOTE — Assessment & Plan Note (Signed)
Asymptomatic and normotensive EKG still showing sinus bradycardia.  Unchanged from 1 year ago Stable.  No concerns.

## 2023-11-29 NOTE — Assessment & Plan Note (Signed)
Chronic stable condition Continues rosuvastatin 10 mg daily Diet and nutrition discussed

## 2023-12-26 ENCOUNTER — Inpatient Hospital Stay (HOSPITAL_COMMUNITY): Admitting: Anesthesiology

## 2023-12-26 ENCOUNTER — Inpatient Hospital Stay (HOSPITAL_COMMUNITY)

## 2023-12-26 ENCOUNTER — Emergency Department (HOSPITAL_COMMUNITY)

## 2023-12-26 ENCOUNTER — Encounter (HOSPITAL_COMMUNITY)
Admission: EM | Disposition: A | Discharge status: Skilled Nursing Facility | Payer: Self-pay | Source: Home / Self Care | Attending: Emergency Medicine

## 2023-12-26 ENCOUNTER — Other Ambulatory Visit: Payer: Self-pay

## 2023-12-26 ENCOUNTER — Ambulatory Visit: Payer: Self-pay

## 2023-12-26 ENCOUNTER — Inpatient Hospital Stay (HOSPITAL_COMMUNITY)
Admission: EM | Admit: 2023-12-26 | Discharge: 2023-12-29 | DRG: 522 | Disposition: A | Discharge status: Skilled Nursing Facility | Attending: Internal Medicine | Admitting: Internal Medicine

## 2023-12-26 ENCOUNTER — Encounter (HOSPITAL_COMMUNITY): Payer: Self-pay | Admitting: Student

## 2023-12-26 DIAGNOSIS — M6281 Muscle weakness (generalized): Secondary | ICD-10-CM | POA: Diagnosis not present

## 2023-12-26 DIAGNOSIS — J3489 Other specified disorders of nose and nasal sinuses: Secondary | ICD-10-CM

## 2023-12-26 DIAGNOSIS — I10 Essential (primary) hypertension: Secondary | ICD-10-CM | POA: Diagnosis present

## 2023-12-26 DIAGNOSIS — M542 Cervicalgia: Secondary | ICD-10-CM | POA: Diagnosis not present

## 2023-12-26 DIAGNOSIS — Z7982 Long term (current) use of aspirin: Secondary | ICD-10-CM

## 2023-12-26 DIAGNOSIS — Z8249 Family history of ischemic heart disease and other diseases of the circulatory system: Secondary | ICD-10-CM | POA: Diagnosis not present

## 2023-12-26 DIAGNOSIS — Z79899 Other long term (current) drug therapy: Secondary | ICD-10-CM

## 2023-12-26 DIAGNOSIS — N179 Acute kidney failure, unspecified: Secondary | ICD-10-CM | POA: Diagnosis not present

## 2023-12-26 DIAGNOSIS — Y92009 Unspecified place in unspecified non-institutional (private) residence as the place of occurrence of the external cause: Secondary | ICD-10-CM | POA: Diagnosis not present

## 2023-12-26 DIAGNOSIS — F02B0 Dementia in other diseases classified elsewhere, moderate, without behavioral disturbance, psychotic disturbance, mood disturbance, and anxiety: Secondary | ICD-10-CM | POA: Diagnosis present

## 2023-12-26 DIAGNOSIS — S72001D Fracture of unspecified part of neck of right femur, subsequent encounter for closed fracture with routine healing: Secondary | ICD-10-CM | POA: Diagnosis not present

## 2023-12-26 DIAGNOSIS — D62 Acute posthemorrhagic anemia: Secondary | ICD-10-CM | POA: Diagnosis not present

## 2023-12-26 DIAGNOSIS — S72031A Displaced midcervical fracture of right femur, initial encounter for closed fracture: Secondary | ICD-10-CM | POA: Diagnosis not present

## 2023-12-26 DIAGNOSIS — S0990XA Unspecified injury of head, initial encounter: Secondary | ICD-10-CM | POA: Diagnosis not present

## 2023-12-26 DIAGNOSIS — Z96641 Presence of right artificial hip joint: Secondary | ICD-10-CM | POA: Diagnosis not present

## 2023-12-26 DIAGNOSIS — E871 Hypo-osmolality and hyponatremia: Secondary | ICD-10-CM | POA: Diagnosis not present

## 2023-12-26 DIAGNOSIS — S7291XD Unspecified fracture of right femur, subsequent encounter for closed fracture with routine healing: Secondary | ICD-10-CM | POA: Diagnosis not present

## 2023-12-26 DIAGNOSIS — K5790 Diverticulosis of intestine, part unspecified, without perforation or abscess without bleeding: Secondary | ICD-10-CM | POA: Diagnosis not present

## 2023-12-26 DIAGNOSIS — R001 Bradycardia, unspecified: Secondary | ICD-10-CM | POA: Diagnosis not present

## 2023-12-26 DIAGNOSIS — Z471 Aftercare following joint replacement surgery: Secondary | ICD-10-CM | POA: Diagnosis not present

## 2023-12-26 DIAGNOSIS — G309 Alzheimer's disease, unspecified: Secondary | ICD-10-CM | POA: Diagnosis present

## 2023-12-26 DIAGNOSIS — R748 Abnormal levels of other serum enzymes: Secondary | ICD-10-CM | POA: Diagnosis present

## 2023-12-26 DIAGNOSIS — W19XXXA Unspecified fall, initial encounter: Secondary | ICD-10-CM | POA: Diagnosis not present

## 2023-12-26 DIAGNOSIS — R2689 Other abnormalities of gait and mobility: Secondary | ICD-10-CM | POA: Diagnosis not present

## 2023-12-26 DIAGNOSIS — F0390 Unspecified dementia without behavioral disturbance: Secondary | ICD-10-CM | POA: Diagnosis not present

## 2023-12-26 DIAGNOSIS — Z7401 Bed confinement status: Secondary | ICD-10-CM | POA: Diagnosis not present

## 2023-12-26 DIAGNOSIS — S7291XA Unspecified fracture of right femur, initial encounter for closed fracture: Secondary | ICD-10-CM | POA: Diagnosis present

## 2023-12-26 DIAGNOSIS — S72001A Fracture of unspecified part of neck of right femur, initial encounter for closed fracture: Secondary | ICD-10-CM | POA: Diagnosis not present

## 2023-12-26 DIAGNOSIS — S199XXA Unspecified injury of neck, initial encounter: Secondary | ICD-10-CM | POA: Diagnosis not present

## 2023-12-26 DIAGNOSIS — M25551 Pain in right hip: Secondary | ICD-10-CM | POA: Diagnosis present

## 2023-12-26 DIAGNOSIS — E785 Hyperlipidemia, unspecified: Secondary | ICD-10-CM | POA: Diagnosis present

## 2023-12-26 DIAGNOSIS — Z87891 Personal history of nicotine dependence: Secondary | ICD-10-CM

## 2023-12-26 DIAGNOSIS — R531 Weakness: Secondary | ICD-10-CM | POA: Diagnosis not present

## 2023-12-26 DIAGNOSIS — M6282 Rhabdomyolysis: Secondary | ICD-10-CM

## 2023-12-26 DIAGNOSIS — R2681 Unsteadiness on feet: Secondary | ICD-10-CM | POA: Diagnosis not present

## 2023-12-26 DIAGNOSIS — R1111 Vomiting without nausea: Secondary | ICD-10-CM | POA: Diagnosis not present

## 2023-12-26 DIAGNOSIS — Z823 Family history of stroke: Secondary | ICD-10-CM | POA: Diagnosis not present

## 2023-12-26 HISTORY — PX: HEMIARTHROPLASTY (BIPOLAR), HIP, DIRECT ANTERIOR APPROACH, FOR FRACTURE: SHX7584

## 2023-12-26 LAB — BASIC METABOLIC PANEL WITH GFR
Anion gap: 11 (ref 5–15)
BUN: 32 mg/dL — ABNORMAL HIGH (ref 8–23)
CO2: 27 mmol/L (ref 22–32)
Calcium: 10.1 mg/dL (ref 8.9–10.3)
Chloride: 95 mmol/L — ABNORMAL LOW (ref 98–111)
Creatinine, Ser: 1.42 mg/dL — ABNORMAL HIGH (ref 0.61–1.24)
GFR, Estimated: 51 mL/min — ABNORMAL LOW (ref >60)
Glucose, Bld: 124 mg/dL — ABNORMAL HIGH (ref 70–99)
Potassium: 4.9 mmol/L (ref 3.5–5.1)
Sodium: 133 mmol/L — ABNORMAL LOW (ref 135–145)

## 2023-12-26 LAB — TSH: TSH: 1.16 u[IU]/mL (ref 0.350–4.500)

## 2023-12-26 LAB — CBC
HCT: 42.7 % (ref 39.0–52.0)
Hemoglobin: 13.8 g/dL (ref 13.0–17.0)
MCH: 30.4 pg (ref 26.0–34.0)
MCHC: 32.3 g/dL (ref 30.0–36.0)
MCV: 94.1 fL (ref 80.0–100.0)
Platelets: 204 K/uL (ref 150–400)
RBC: 4.54 MIL/uL (ref 4.22–5.81)
RDW: 12.8 % (ref 11.5–15.5)
WBC: 5.4 K/uL (ref 4.0–10.5)
nRBC: 0 % (ref 0.0–0.2)

## 2023-12-26 LAB — VITAMIN D 25 HYDROXY (VIT D DEFICIENCY, FRACTURES): Vit D, 25-Hydroxy: 23.16 ng/mL — ABNORMAL LOW (ref 30–100)

## 2023-12-26 LAB — TYPE AND SCREEN
ABO/RH(D): O POS
Antibody Screen: NEGATIVE

## 2023-12-26 LAB — SURGICAL PCR SCREEN
MRSA, PCR: NEGATIVE
Staphylococcus aureus: NEGATIVE

## 2023-12-26 LAB — CK: Total CK: 1393 U/L — ABNORMAL HIGH (ref 49–397)

## 2023-12-26 LAB — ABO/RH: ABO/RH(D): O POS

## 2023-12-26 SURGERY — HEMIARTHROPLASTY (BIPOLAR), HIP, DIRECT ANTERIOR APPROACH, FOR FRACTURE
Anesthesia: General | Site: Hip | Laterality: Right

## 2023-12-26 MED ORDER — CEFAZOLIN SODIUM-DEXTROSE 2-4 GM/100ML-% IV SOLN
2.0000 g | INTRAVENOUS | Status: AC
  Administered 2023-12-26: 2 mg via INTRAVENOUS

## 2023-12-26 MED ORDER — LACTATED RINGERS IV SOLN: INTRAVENOUS | Status: DC | PRN

## 2023-12-26 MED ORDER — ACETAMINOPHEN 325 MG PO TABS: 650.0000 mg | ORAL_TABLET | Freq: Four times a day (QID) | ORAL | Status: DC | PRN

## 2023-12-26 MED ORDER — ACETAMINOPHEN 500 MG PO TABS
1000.0000 mg | ORAL_TABLET | Freq: Three times a day (TID) | ORAL | Status: DC
  Administered 2023-12-27 – 2023-12-29 (×7): 1000 mg via ORAL
  Filled 2023-12-26 (×7): qty 2

## 2023-12-26 MED ORDER — METHOCARBAMOL 1000 MG/10ML IJ SOLN: 500.0000 mg | Freq: Four times a day (QID) | INTRAMUSCULAR | Status: DC | PRN

## 2023-12-26 MED ORDER — CEFAZOLIN SODIUM-DEXTROSE 2-4 GM/100ML-% IV SOLN
2.0000 g | Freq: Four times a day (QID) | INTRAVENOUS | Status: AC
  Administered 2023-12-26 – 2023-12-27 (×2): 2 g via INTRAVENOUS
  Filled 2023-12-26 (×2): qty 100

## 2023-12-26 MED ORDER — ONDANSETRON HCL 4 MG/2ML IJ SOLN
INTRAMUSCULAR | Status: DC | PRN
  Administered 2023-12-26: 4 mg via INTRAVENOUS

## 2023-12-26 MED ORDER — SUGAMMADEX SODIUM 200 MG/2ML IV SOLN
INTRAVENOUS | Status: AC
  Filled 2023-12-26: qty 2

## 2023-12-26 MED ORDER — ASPIRIN 81 MG PO CHEW
81.0000 mg | CHEWABLE_TABLET | Freq: Two times a day (BID) | ORAL | Status: DC
Start: 2023-12-27 — End: 2023-12-29
  Administered 2023-12-27 – 2023-12-29 (×5): 81 mg via ORAL
  Filled 2023-12-26 (×5): qty 1

## 2023-12-26 MED ORDER — ACETAMINOPHEN 650 MG RE SUPP: 650.0000 mg | Freq: Four times a day (QID) | RECTAL | Status: DC | PRN

## 2023-12-26 MED ORDER — KETOROLAC TROMETHAMINE 30 MG/ML IJ SOLN
INTRAMUSCULAR | Status: AC
Start: 2023-12-26 — End: 2023-12-26
  Filled 2023-12-26: qty 1

## 2023-12-26 MED ORDER — FENTANYL CITRATE (PF) 100 MCG/2ML IJ SOLN
INTRAMUSCULAR | Status: AC
  Filled 2023-12-26: qty 2

## 2023-12-26 MED ORDER — ACETAMINOPHEN 325 MG PO TABS: 325.0000 mg | ORAL_TABLET | Freq: Four times a day (QID) | ORAL | Status: DC | PRN

## 2023-12-26 MED ORDER — SODIUM CHLORIDE (PF) 0.9 % IJ SOLN
INTRAMUSCULAR | Status: AC
Start: 2023-12-26 — End: 2023-12-26
  Filled 2023-12-26: qty 50

## 2023-12-26 MED ORDER — SODIUM CHLORIDE 0.9 % IR SOLN
Status: DC | PRN
Start: 2023-12-26 — End: 2023-12-26
  Administered 2023-12-26: 1000 mL

## 2023-12-26 MED ORDER — PROPOFOL 10 MG/ML IV BOLUS
INTRAVENOUS | Status: AC
  Filled 2023-12-26: qty 20

## 2023-12-26 MED ORDER — ALUM & MAG HYDROXIDE-SIMETH 200-200-20 MG/5ML PO SUSP: 30.0000 mL | ORAL | Status: DC | PRN

## 2023-12-26 MED ORDER — ALBUMIN HUMAN 5 % IV SOLN: INTRAVENOUS | Status: DC | PRN

## 2023-12-26 MED ORDER — PHENOL 1.4 % MT LIQD: 1.0000 | OROMUCOSAL | Status: DC | PRN

## 2023-12-26 MED ORDER — FENTANYL CITRATE (PF) 50 MCG/ML IJ SOSY: 12.5000 ug | PREFILLED_SYRINGE | INTRAMUSCULAR | Status: DC | PRN

## 2023-12-26 MED ORDER — METHOCARBAMOL 500 MG PO TABS: 500.0000 mg | ORAL_TABLET | Freq: Four times a day (QID) | ORAL | Status: DC | PRN

## 2023-12-26 MED ORDER — ONDANSETRON HCL 4 MG/2ML IJ SOLN: 4.0000 mg | Freq: Four times a day (QID) | INTRAMUSCULAR | Status: DC | PRN

## 2023-12-26 MED ORDER — TRANEXAMIC ACID-NACL 1000-0.7 MG/100ML-% IV SOLN
INTRAVENOUS | Status: AC
  Filled 2023-12-26: qty 100

## 2023-12-26 MED ORDER — POLYETHYLENE GLYCOL 3350 17 G PO PACK
17.0000 g | PACK | Freq: Two times a day (BID) | ORAL | Status: DC
  Administered 2023-12-26 – 2023-12-27 (×2): 17 g via ORAL
  Filled 2023-12-26 (×4): qty 1

## 2023-12-26 MED ORDER — PROPOFOL 1000 MG/100ML IV EMUL
INTRAVENOUS | Status: AC
Start: 2023-12-26 — End: 2023-12-26
  Filled 2023-12-26: qty 100

## 2023-12-26 MED ORDER — POVIDONE-IODINE 10 % EX SWAB: 2.0000 | Freq: Once | CUTANEOUS | Status: DC

## 2023-12-26 MED ORDER — STERILE WATER FOR IRRIGATION IR SOLN
Status: DC | PRN
  Administered 2023-12-26: 2000 mL

## 2023-12-26 MED ORDER — OXYCODONE HCL 5 MG PO TABS: 5.0000 mg | ORAL_TABLET | ORAL | Status: DC | PRN

## 2023-12-26 MED ORDER — OXYCODONE HCL 5 MG PO TABS: 10.0000 mg | ORAL_TABLET | ORAL | Status: DC | PRN

## 2023-12-26 MED ORDER — SODIUM CHLORIDE 0.9 % IV SOLN: INTRAVENOUS | Status: DC

## 2023-12-26 MED ORDER — MENTHOL 3 MG MT LOZG: 1.0000 | LOZENGE | OROMUCOSAL | Status: DC | PRN

## 2023-12-26 MED ORDER — FENTANYL CITRATE (PF) 100 MCG/2ML IJ SOLN
INTRAMUSCULAR | Status: DC | PRN
  Administered 2023-12-26 (×3): 50 ug via INTRAVENOUS

## 2023-12-26 MED ORDER — PHENYLEPHRINE 80 MCG/ML (10ML) SYRINGE FOR IV PUSH (FOR BLOOD PRESSURE SUPPORT)
PREFILLED_SYRINGE | INTRAVENOUS | Status: DC | PRN
  Administered 2023-12-26: 160 ug via INTRAVENOUS
  Administered 2023-12-26: 80 ug via INTRAVENOUS
  Administered 2023-12-26: 160 ug via INTRAVENOUS
  Administered 2023-12-26: 80 ug via INTRAVENOUS
  Administered 2023-12-26: 160 ug via INTRAVENOUS
  Administered 2023-12-26: 80 ug via INTRAVENOUS

## 2023-12-26 MED ORDER — TRANEXAMIC ACID-NACL 1000-0.7 MG/100ML-% IV SOLN
1000.0000 mg | Freq: Once | INTRAVENOUS | Status: AC
Start: 2023-12-26 — End: 2023-12-27
  Administered 2023-12-26: 1000 mg via INTRAVENOUS
  Filled 2023-12-26: qty 100

## 2023-12-26 MED ORDER — LIDOCAINE HCL (PF) 2 % IJ SOLN
INTRAMUSCULAR | Status: AC
Start: 2023-12-26 — End: 2023-12-26
  Filled 2023-12-26: qty 5

## 2023-12-26 MED ORDER — DEXAMETHASONE SOD PHOSPHATE PF 10 MG/ML IJ SOLN
10.0000 mg | Freq: Once | INTRAMUSCULAR | Status: AC
  Administered 2023-12-27: 10 mg via INTRAVENOUS

## 2023-12-26 MED ORDER — HYDROMORPHONE HCL 1 MG/ML IJ SOLN: 0.5000 mg | INTRAMUSCULAR | Status: DC | PRN

## 2023-12-26 MED ORDER — 0.9 % SODIUM CHLORIDE (POUR BTL) OPTIME
TOPICAL | Status: DC | PRN
  Administered 2023-12-26: 1000 mL

## 2023-12-26 MED ORDER — METOCLOPRAMIDE HCL 5 MG/ML IJ SOLN: 5.0000 mg | Freq: Three times a day (TID) | INTRAMUSCULAR | Status: DC | PRN

## 2023-12-26 MED ORDER — ROCURONIUM BROMIDE 10 MG/ML (PF) SYRINGE
PREFILLED_SYRINGE | INTRAVENOUS | Status: AC
Start: 2023-12-26 — End: 2023-12-26
  Filled 2023-12-26: qty 10

## 2023-12-26 MED ORDER — MUPIROCIN 2 % EX OINT: 1.0000 | TOPICAL_OINTMENT | Freq: Two times a day (BID) | CUTANEOUS | Status: DC

## 2023-12-26 MED ORDER — ONDANSETRON HCL 4 MG PO TABS: 4.0000 mg | ORAL_TABLET | Freq: Four times a day (QID) | ORAL | Status: DC | PRN

## 2023-12-26 MED ORDER — SODIUM CHLORIDE 0.9 % IV BOLUS
1000.0000 mL | Freq: Once | INTRAVENOUS | Status: AC
  Administered 2023-12-26: 1000 mL via INTRAVENOUS

## 2023-12-26 MED ORDER — DEXAMETHASONE SOD PHOSPHATE PF 10 MG/ML IJ SOLN
8.0000 mg | Freq: Once | INTRAMUSCULAR | Status: AC
  Administered 2023-12-26: 8 mg via INTRAVENOUS

## 2023-12-26 MED ORDER — ACETAMINOPHEN 10 MG/ML IV SOLN
INTRAVENOUS | Status: DC | PRN
Start: 2023-12-26 — End: 2023-12-26
  Administered 2023-12-26: 1000 mg via INTRAVENOUS

## 2023-12-26 MED ORDER — FENTANYL CITRATE (PF) 50 MCG/ML IJ SOSY: 25.0000 ug | PREFILLED_SYRINGE | INTRAMUSCULAR | Status: DC | PRN

## 2023-12-26 MED ORDER — METOCLOPRAMIDE HCL 5 MG PO TABS: 5.0000 mg | ORAL_TABLET | Freq: Three times a day (TID) | ORAL | Status: DC | PRN

## 2023-12-26 MED ORDER — ROCURONIUM BROMIDE 10 MG/ML (PF) SYRINGE
PREFILLED_SYRINGE | INTRAVENOUS | Status: DC | PRN
  Administered 2023-12-26: 20 mg via INTRAVENOUS
  Administered 2023-12-26: 50 mg via INTRAVENOUS
  Administered 2023-12-26: 20 mg via INTRAVENOUS

## 2023-12-26 MED ORDER — BUPIVACAINE-EPINEPHRINE (PF) 0.25% -1:200000 IJ SOLN
INTRAMUSCULAR | Status: AC
  Filled 2023-12-26: qty 30

## 2023-12-26 MED ORDER — PHENYLEPHRINE HCL-NACL 20-0.9 MG/250ML-% IV SOLN
INTRAVENOUS | Status: DC | PRN
  Administered 2023-12-26: 25 ug/min via INTRAVENOUS

## 2023-12-26 MED ORDER — SUGAMMADEX SODIUM 200 MG/2ML IV SOLN
INTRAVENOUS | Status: DC | PRN
Start: 2023-12-26 — End: 2023-12-26
  Administered 2023-12-26: 200 mg via INTRAVENOUS

## 2023-12-26 MED ORDER — ALBUMIN HUMAN 5 % IV SOLN
INTRAVENOUS | Status: AC
  Filled 2023-12-26: qty 250

## 2023-12-26 MED ORDER — CEFAZOLIN SODIUM-DEXTROSE 2-4 GM/100ML-% IV SOLN
INTRAVENOUS | Status: AC
Start: 2023-12-26 — End: 2023-12-26
  Filled 2023-12-26: qty 100

## 2023-12-26 MED ORDER — ONDANSETRON HCL 4 MG/2ML IJ SOLN
INTRAMUSCULAR | Status: AC
  Filled 2023-12-26: qty 2

## 2023-12-26 MED ORDER — DIPHENHYDRAMINE HCL 12.5 MG/5ML PO ELIX
12.5000 mg | ORAL_SOLUTION | ORAL | Status: DC | PRN
  Administered 2023-12-27: 25 mg via ORAL
  Filled 2023-12-26: qty 10

## 2023-12-26 MED ORDER — SENNA 8.6 MG PO TABS
2.0000 | ORAL_TABLET | Freq: Every day | ORAL | Status: DC
  Administered 2023-12-26: 17.2 mg via ORAL
  Filled 2023-12-26: qty 2

## 2023-12-26 MED ORDER — LIDOCAINE HCL (PF) 2 % IJ SOLN
INTRAMUSCULAR | Status: DC | PRN
  Administered 2023-12-26: 60 mg via INTRADERMAL

## 2023-12-26 MED ORDER — ACETAMINOPHEN 10 MG/ML IV SOLN
INTRAVENOUS | Status: AC
  Filled 2023-12-26: qty 100

## 2023-12-26 MED ORDER — PROPOFOL 10 MG/ML IV BOLUS
INTRAVENOUS | Status: DC | PRN
  Administered 2023-12-26: 170 ug/kg/min via INTRAVENOUS
  Administered 2023-12-26: 125 ug/kg/min via INTRAVENOUS
  Administered 2023-12-26: 70 mg via INTRAVENOUS

## 2023-12-26 MED ORDER — SODIUM CHLORIDE (PF) 0.9 % IJ SOLN
INTRAMUSCULAR | Status: DC | PRN
  Administered 2023-12-26: 60 mL

## 2023-12-26 MED ORDER — TRANEXAMIC ACID-NACL 1000-0.7 MG/100ML-% IV SOLN
1000.0000 mg | INTRAVENOUS | Status: AC
  Administered 2023-12-26: 1000 mg via INTRAVENOUS

## 2023-12-26 MED ORDER — SODIUM CHLORIDE 0.9 % IV SOLN
INTRAVENOUS | Status: AC
Start: 2023-12-26 — End: 2023-12-27

## 2023-12-26 MED ORDER — BISACODYL 10 MG RE SUPP: 10.0000 mg | Freq: Every day | RECTAL | Status: DC | PRN

## 2023-12-26 SURGICAL SUPPLY — 40 items
BAG COUNTER SPONGE SURGICOUNT (BAG) IMPLANT
BAG ZIPLOCK 12X15 (MISCELLANEOUS) ×1 IMPLANT
BLADE SAW SGTL 18X1.27X75 (BLADE) ×1 IMPLANT
CEMENT BONE GENTAMICIN 40 (Cement) IMPLANT
CEMENT RESTRICTOR BONE PREP ST (Cement) IMPLANT
CEMENTRALIZER 8.5MM (Orthopedic Implant) IMPLANT
COVER SURGICAL LIGHT HANDLE (MISCELLANEOUS) ×1 IMPLANT
DERMABOND ADVANCED .7 DNX12 (GAUZE/BANDAGES/DRESSINGS) ×1 IMPLANT
DRAPE POUCH INSTRU U-SHP 10X18 (DRAPES) ×1 IMPLANT
DRAPE SURG 17X11 SM STRL (DRAPES) ×1 IMPLANT
DRAPE SURG ORHT 6 SPLT 77X108 (DRAPES) ×2 IMPLANT
DRAPE U-SHAPE 47X51 STRL (DRAPES) ×2 IMPLANT
DRSG AQUACEL AG ADV 3.5X10 (GAUZE/BANDAGES/DRESSINGS) ×1 IMPLANT
DRSG TEGADERM 4X4.75 (GAUZE/BANDAGES/DRESSINGS) ×1 IMPLANT
DURAPREP 26ML APPLICATOR (WOUND CARE) ×1 IMPLANT
ELECT BLADE TIP CTD 4 INCH (ELECTRODE) ×1 IMPLANT
ELECT REM PT RETURN 15FT ADLT (MISCELLANEOUS) ×1 IMPLANT
FACESHIELD WRAPAROUND OR TEAM (MASK) ×2 IMPLANT
GLOVE BIO SURGEON STRL SZ 6 (GLOVE) ×1 IMPLANT
GLOVE BIOGEL PI IND STRL 6.5 (GLOVE) ×1 IMPLANT
GLOVE BIOGEL PI IND STRL 7.5 (GLOVE) ×1 IMPLANT
GLOVE ORTHO TXT STRL SZ7.5 (GLOVE) ×2 IMPLANT
GOWN STRL REUS W/ TWL LRG LVL3 (GOWN DISPOSABLE) ×2 IMPLANT
HEAD BIPOLAR DEPUY 50 (Hips) IMPLANT
HEAD FEM STD 28X+1.5 STRL (Hips) IMPLANT
KIT BASIN OR (CUSTOM PROCEDURE TRAY) ×1 IMPLANT
KIT TURNOVER KIT A (KITS) ×1 IMPLANT
MANIFOLD NEPTUNE II (INSTRUMENTS) ×1 IMPLANT
PACK TOTAL JOINT (CUSTOM PROCEDURE TRAY) ×1 IMPLANT
PENCIL SMOKE EVACUATOR (MISCELLANEOUS) ×1 IMPLANT
PRESSURIZER FEMORAL UNIV (MISCELLANEOUS) IMPLANT
PROTECTOR NERVE ULNAR (MISCELLANEOUS) ×1 IMPLANT
SET HNDPC FAN SPRY TIP SCT (DISPOSABLE) IMPLANT
STEM FEM CMT STD SZ2 40X97 (Stem) IMPLANT
SUT MNCRL AB 4-0 PS2 18 (SUTURE) ×1 IMPLANT
SUT VIC AB 1 CT1 36 (SUTURE) ×1 IMPLANT
SUT VIC AB 2-0 CT1 TAPERPNT 27 (SUTURE) ×2 IMPLANT
SUTURE STRATFX 0 PDS 27 VIOLET (SUTURE) ×1 IMPLANT
TOWEL OR 17X26 10 PK STRL BLUE (TOWEL DISPOSABLE) ×2 IMPLANT
TRAY FOLEY MTR SLVR 16FR STAT (SET/KITS/TRAYS/PACK) ×1 IMPLANT

## 2023-12-26 NOTE — Discharge Instructions (Signed)

## 2023-12-26 NOTE — ED Notes (Signed)
 Patient taken for CT scan. Eric Floyd

## 2023-12-26 NOTE — ED Provider Notes (Signed)
 [facility] Provider Note   CSN: [id number] Arrival date & time: 12/26/23  9052     Patient presents with: No chief complaint on file.   Oryon Gary Arth is a 78 y.o. male with past medical history seen for hyperlipidemia, Alzheimer's, hypertension who presents with concern for fall this morning around 1 AM.  Picked up by family.  Some pain in right hip, leg.  Moving leg, no shortening noted.  Patient at baseline dementia, does not recall falling, reports no pain anywhere.  He did initially say yes when I asked him if he hit his head, no obvious trauma noted.   HPI     Prior to Admission medications   Medication Sig Start Date End Date Taking? Authorizing Provider  Ascorbic Acid (VITAMIN C) 100 MG tablet Take 500 mg by mouth daily.    [provider]  Calcium  Carb-Cholecalciferol (CALCIUM  600 + D PO) Take by mouth.    [provider]  cetirizine (ZYRTEC) 10 MG tablet Take 10 mg by mouth daily.    [provider]  COCONUT OIL PO Take by mouth. Once daily    [provider]  Cyanocobalamin (VITAMIN B-12 PO) Take by mouth.    [provider]  hydrOXYzine (ATARAX) 25 MG tablet Take 1 tablet (25 mg total) by mouth at bedtime as needed. 11/29/23   Sagardia, Miguel Jose, MD  ipratropium (ATROVENT ) 0.03 % nasal spray USE 2 SPRAY(S) IN EACH NOSTRIL EVERY 12 HOURS 10/03/23   Sagardia, Miguel Jose, MD  lisinopril  (ZESTRIL ) 10 MG tablet Take 1 tablet by mouth once daily 10/05/23   Sagardia, Miguel Jose, MD  memantine  (NAMENDA ) 5 MG tablet Take 1 tablet (5 mg total) by mouth 2 (two) times daily. 06/03/23   Gayland Lauraine PARAS, NP  Multiple Vitamin (MULTIVITAMIN) tablet Take 1 tablet by mouth daily.    [provider]  NON FORMULARY Take 1 capsule by mouth daily. [address] MCT Oil    [provider]  NON FORMULARY Take 1 tablet by mouth daily. Focus factor    [provider]  Omega-3  Fatty Acids (FISH OIL OMEGA-3 PO) Take by mouth.    [provider]  rosuvastatin  (CRESTOR ) 10 MG tablet Take 1 tablet by mouth once daily 10/05/23   Sagardia, Miguel Jose, MD  Vitamin D , Ergocalciferol , (DRISDOL ) 1.25 MG (50000 UNIT) CAPS capsule TAKE 1 CAPSULE EVERY 7 DAYS 02/26/20   Sagardia, Miguel Jose, MD    Allergies: Patient has no known allergies.    Review of Systems  All other systems reviewed and are negative.   Updated Vital Signs BP 121/64   Pulse (!) 58   Temp 97.6 F (36.4 C) (Oral)   Resp 16   SpO2 98%   Physical Exam Vitals and nursing note reviewed.  Constitutional:      General: He is not in acute distress.    Appearance: Normal appearance.  HENT:     Head: Normocephalic and atraumatic.  Eyes:     General:        Right eye: No discharge.        Left eye: No discharge.  Cardiovascular:     Rate and Rhythm: Normal rate and regular rhythm.     Pulses: Normal pulses.     Heart sounds: No murmur heard.    No friction rub. No gallop.  Pulmonary:     Effort: Pulmonary effort is normal.     Breath  sounds: Normal breath sounds.  Abdominal:     General: Bowel sounds are normal.     Palpations: Abdomen is soft.  Musculoskeletal:     Comments: Head and neck are atraumatic, moving all 4 limbs spontaneously, no significant tenderness to palpation of bilateral greater trochanters, intact strength 5/5 of left lower extremity. Decreased on right secondary to pain / trauma.  Skin:    General: Skin is warm and dry.     Capillary Refill: Capillary refill takes less than 2 seconds.  Neurological:     Mental Status: He is alert and oriented to person, place, and time.  Psychiatric:        Mood and Affect: Mood normal.        Behavior: Behavior normal.     (all labs ordered are listed, but only abnormal results are displayed) Labs Reviewed  BASIC METABOLIC PANEL WITH GFR - Abnormal; Notable for the following components:      Result Value   Sodium 133 (*)     Chloride 95 (*)    Glucose, Bld 124 (*)    BUN 32 (*)    Creatinine, Ser 1.42 (*)    GFR, Estimated 51 (*)    All other components within normal limits  CK - Abnormal; Notable for the following components:   Total CK 1,393 (*)    All other components within normal limits  CBC  VITAMIN D  25 HYDROXY (VIT D DEFICIENCY, FRACTURES)  TSH    EKG: None  Radiology: CT Cervical Spine Wo Contrast Result Date: 12/26/2023 EXAM: CT CERVICAL SPINE WITHOUT CONTRAST 12/26/2023 11:27:34 AM TECHNIQUE: CT of the cervical spine was performed without the administration of intravenous contrast. Multiplanar reformatted images are provided for review. Automated exposure control, iterative reconstruction, and/or weight based adjustment of the mA/kV was utilized to reduce the radiation dose to as low as reasonably achievable. COMPARISON: Head CT reported separately today. CLINICAL HISTORY: 78 year old male with neck trauma after a fall this morning. Patient has dementia and right hip/upper leg pain. FINDINGS: CERVICAL SPINE: BONES AND ALIGNMENT: No acute fracture or traumatic malalignment. Degenerative reversal of the normal cervical lordosis. Mild dextroconvex cervical scoliosis. DEGENERATIVE CHANGES: Bulky anterior endplate osteophytes C4-C5 through C6-C7. Severe disc space loss at those levels. Some vacuum disc. Multilevel mild cervical spinal stenosis suspected. Degenerative ankylosis of the left C2-C3 facets. SOFT TISSUES: No prevertebral soft tissue swelling. Mild for age cervical carotid calcified atherosclerosis. Mild apical lung scarring at the thoracic inlet. IMPRESSION: 1. No acute traumatic injury identified in the cervical spine. 2. Advanced cervical degeneration with suspected multilevel mild cervical spinal stenosis. Electronically signed by: Helayne Hurst MD 12/26/2023 11:35 AM EDT RP Workstation: [id number]   CT Head Wo Contrast Result Date: 12/26/2023 EXAM: CT HEAD WITHOUT CONTRAST 12/26/2023  11:27:34 AM TECHNIQUE: CT of the head was performed without the administration of intravenous contrast. Automated exposure control, iterative reconstruction, and/or weight based adjustment of the mA/kV was utilized to reduce the radiation dose to as low as reasonably achievable. COMPARISON: Brain MRI 03/13/2020. CLINICAL HISTORY: 78 year old male. Minor head trauma after a fall at 1 AM. Patient has dementia. Complains of right hip/upper leg pain. FINDINGS: BRAIN AND VENTRICLES: No acute hemorrhage. No evidence of acute infarct. No hydrocephalus. No extra-axial collection. No mass effect or midline shift. Cerebral volume not significantly changed. Gray white differentiation is within normal limits for age. No suspicious intracranial vascular hyperdensity. ORBITS: No discrete orbital soft tissue injury identified. SINUSES: Minor paranasal sinus mucosal thickening.  SOFT TISSUES AND SKULL: No discrete scalp soft tissue injury identified. No skull fracture. Significant styloid phalanx. IMPRESSION: 1. No acute traumatic injury identified 2. Negative for age non-contrast head CT. Electronically signed by: Helayne Hurst MD 12/26/2023 11:32 AM EDT RP Workstation: [id number]   DG Hip Unilat W or Wo Pelvis 2-3 Views Right Result Date: 12/26/2023 EXAM: 2 or more VIEW(S) XRAY OF THE RIGHT HIP 12/26/2023 11:02:00 AM COMPARISON: None available. CLINICAL HISTORY: fall. Patient fell this morning around 1am. Patient has Dementia and was picked up by family. This morning patient complained of some pain in right hip and upper leg. Patient is moving his leg. FINDINGS: BONES AND JOINTS: Acute transcervical fracture of the right femoral neck with varus angulation. SOFT TISSUES: Vascular calcifications. IMPRESSION: 1. Acute transcervical fracture of the right femoral neck with varus impaction. Electronically signed by: Helayne Hurst MD 12/26/2023 11:21 AM EDT RP Workstation: [id number]     Procedures   Medications Ordered in the ED   0.9 %  sodium chloride  infusion (has no administration in time range)  acetaminophen (TYLENOL) tablet 650 mg (has no administration in time range)    Or  acetaminophen (TYLENOL) suppository 650 mg (has no administration in time range)  fentaNYL (SUBLIMAZE) injection 12.5-25 mcg (has no administration in time range)  sodium chloride  0.9 % bolus 1,000 mL (1,000 mLs Intravenous New Bag/Given 12/26/23 1148)    Clinical Course as of 12/26/23 1235  Sun Dec 26, 2023  1234 Per son, nothing to eat since last night 7pm [CP]    Clinical Course User Index [CP] Rosan Sherlean DEL, PA-C                                 Medical Decision Making Amount and/or Complexity of Data Reviewed Labs: ordered. Radiology: ordered.   This patient is a 78 y.o. male  who presents to the ED for concern of fall, unknown down time.   Differential diagnoses prior to evaluation: The emergent differential diagnosis includes, but is not limited to, fracture, head injury, rhabdomyolysis, versus other. This is not an exhaustive differential.   Past Medical History / Co-morbidities / Social History: Alzheimer's, hypertension, hyperlipidemia  Physical Exam: Physical exam performed. The pertinent findings include: No obvious leg length discrepancy, but unable to ambulate on the right leg, decree strength to flexion at the right hip.  Lab Tests/Imaging studies: I personally interpreted labs/imaging and the pertinent results include: CBC unremarkable, BMP with mild hyponatremia, sodium 133, mild AKI, BUN 32, creatinine 1.42 from baseline around 0.99.  His CK is elevated at 1300, suspicious for developing rhabdomyolysis in context of unknown downtime..  CT head with no traumatic injury, CT C-spine with advanced degenerative spinal stenosis, plain from agraffe of the right hip shows right femoral neck fracture.  I agree with the radiologist interpretation.   Medications: I ordered medication including fluid bolus for  rhabdomyolysis, AKI.  I have reviewed the patients home medicines and have made adjustments as needed.   Consults: I spoke with the hospitalist, Dr. Kathrin who agrees to admission for AKI, right hip fracture, spoke with orthopedic surgeon, Dr. Teresa who will plan for operative management of the right hip fracture later today.  Disposition: After consideration of the diagnostic results and the patients response to treatment, I feel that patient would benefit from admission for AKI, possible developing rhabdomyolysis, as well as right femoral neck fracture.   Final diagnoses:  None  ED Discharge Orders     None          Rosan Sherlean VEAR DEVONNA 12/26/23 1235    Francesca Elsie CROME, MD 12/27/23 [phone]

## 2023-12-26 NOTE — Consult Note (Signed)
 ORTHOPAEDIC CONSULTATION  REQUESTING PHYSICIAN: Kathrin Mignon DASEN, MD  PCP:  Purcell Emil Schanz, MD  Chief Complaint: Right hip pain  HPI: Eric Floyd is a 78 y.o. male who complains of right hip pain s/p fall. The patient localizes the pain to the right hip and was unable to bear weight after the injury. He states the pain is worse with movement and better with rest. The patient is a tourist information centre manager.  He lives with his wife. He has a history of dementia, but no other significant medical problems and does not take any blood thinners. He has no allergies to antibiotics  The patient is demented and so the son provided the history and consent as he is the medical power of attorney. He speaks English fluently  Past Medical History:  Diagnosis Date   Hyperlipidemia    Memory difficulty 02/19/2020   Past Surgical History:  Procedure Laterality Date   NO PAST SURGERIES     Social History   Socioeconomic History   Marital status: Married    Spouse name: Not on file   Number of children: Not on file   Years of education: Not on file   Highest education level: Not on file  Occupational History   Not on file  Tobacco Use   Smoking status: Former    Current packs/day: 0.00    Types: Cigarettes    Quit date: 03/02/1984    Years since quitting: 39.8   Smokeless tobacco: Never  Vaping Use   Vaping status: Never Used  Substance and Sexual Activity   Alcohol use: Yes    Alcohol/week: 21.0 standard drinks of alcohol    Types: 21 Shots of liquor per week    Comment: 3 drinks a day per pt   Drug use: Never   Sexual activity: Not Currently  Other Topics Concern   Not on file  Social History Narrative   Lives with wife   Right handed   Drinks 1-2 cups caffeine daily   Social Drivers of Health   Financial Resource Strain: Low Risk  (08/18/2023)   Overall Financial Resource Strain (CARDIA)    Difficulty of Paying Living Expenses: Not hard at all  Food Insecurity: No  Food Insecurity (08/18/2023)   Hunger Vital Sign    Worried About Running Out of Food in the Last Year: Never true    Ran Out of Food in the Last Year: Never true  Transportation Needs: No Transportation Needs (08/18/2023)   PRAPARE - Administrator, Civil Service (Medical): No    Lack of Transportation (Non-Medical): No  Physical Activity: Inactive (08/18/2023)   Exercise Vital Sign    Days of Exercise per Week: 0 days    Minutes of Exercise per Session: 0 min  Stress: No Stress Concern Present (08/18/2023)   [employer] of Occupational Health - Occupational Stress Questionnaire    Feeling of Stress: Not at all  Social Connections: Moderately Isolated (08/18/2023)   Social Connection and Isolation Panel    Frequency of Communication with Friends and Family: More than three times a week    Frequency of Social Gatherings with Friends and Family: Once a week    Attends Religious Services: Never    Database Administrator or Organizations: No    Attends Banker Meetings: Never    Marital Status: Married   Family History  Problem Relation Age of Onset   Stroke Mother    Heart disease Brother  Heart attack Brother    Ovarian cancer Niece    Colon cancer Neg Hx    Colon polyps Neg Hx    Esophageal cancer Neg Hx    Rectal cancer Neg Hx    Stomach cancer Neg Hx    No Known Allergies Prior to Admission medications   Medication Sig Start Date End Date Taking? Authorizing Provider  Ascorbic Acid (VITAMIN C) 100 MG tablet Take 500 mg by mouth daily.    [provider]  Calcium  Carb-Cholecalciferol (CALCIUM  600 + D PO) Take by mouth.    [provider]  cetirizine (ZYRTEC) 10 MG tablet Take 10 mg by mouth daily.    [provider]  COCONUT OIL PO Take by mouth. Once daily    [provider]  Cyanocobalamin (VITAMIN B-12 PO) Take by mouth.    [provider]  hydrOXYzine (ATARAX) 25 MG tablet Take 1 tablet (25 mg  total) by mouth at bedtime as needed. 11/29/23   Sagardia, Miguel Jose, MD  ipratropium (ATROVENT ) 0.03 % nasal spray USE 2 SPRAY(S) IN EACH NOSTRIL EVERY 12 HOURS 10/03/23   Sagardia, Miguel Jose, MD  lisinopril  (ZESTRIL ) 10 MG tablet Take 1 tablet by mouth once daily 10/05/23   Sagardia, Miguel Jose, MD  memantine  (NAMENDA ) 5 MG tablet Take 1 tablet (5 mg total) by mouth 2 (two) times daily. 06/03/23   Gayland Lauraine PARAS, NP  Multiple Vitamin (MULTIVITAMIN) tablet Take 1 tablet by mouth daily.    [provider]  NON FORMULARY Take 1 capsule by mouth daily. [address] MCT Oil    [provider]  NON FORMULARY Take 1 tablet by mouth daily. Focus factor    [provider]  Omega-3 Fatty Acids (FISH OIL OMEGA-3 PO) Take by mouth.    [provider]  rosuvastatin  (CRESTOR ) 10 MG tablet Take 1 tablet by mouth once daily 10/05/23   Sagardia, Miguel Jose, MD  Vitamin D , Ergocalciferol , (DRISDOL ) 1.25 MG (50000 UNIT) CAPS capsule TAKE 1 CAPSULE EVERY 7 DAYS 02/26/20   Purcell Emil Schanz, MD   CT Cervical Spine Wo Contrast Result Date: 12/26/2023 EXAM: CT CERVICAL SPINE WITHOUT CONTRAST 12/26/2023 11:27:34 AM TECHNIQUE: CT of the cervical spine was performed without the administration of intravenous contrast. Multiplanar reformatted images are provided for review. Automated exposure control, iterative reconstruction, and/or weight based adjustment of the mA/kV was utilized to reduce the radiation dose to as low as reasonably achievable. COMPARISON: Head CT reported separately today. CLINICAL HISTORY: 78 year old male with neck trauma after a fall this morning. Patient has dementia and right hip/upper leg pain. FINDINGS: CERVICAL SPINE: BONES AND ALIGNMENT: No acute fracture or traumatic malalignment. Degenerative reversal of the normal cervical lordosis. Mild dextroconvex cervical scoliosis. DEGENERATIVE CHANGES: Bulky anterior endplate osteophytes C4-C5 through C6-C7. Severe disc  space loss at those levels. Some vacuum disc. Multilevel mild cervical spinal stenosis suspected. Degenerative ankylosis of the left C2-C3 facets. SOFT TISSUES: No prevertebral soft tissue swelling. Mild for age cervical carotid calcified atherosclerosis. Mild apical lung scarring at the thoracic inlet. IMPRESSION: 1. No acute traumatic injury identified in the cervical spine. 2. Advanced cervical degeneration with suspected multilevel mild cervical spinal stenosis. Electronically signed by: Helayne Hurst MD 12/26/2023 11:35 AM EDT RP Workstation: [id number]   CT Head Wo Contrast Result Date: 12/26/2023 EXAM: CT HEAD WITHOUT CONTRAST 12/26/2023 11:27:34 AM TECHNIQUE: CT of the head was performed without the administration of intravenous contrast. Automated exposure control, iterative reconstruction, and/or weight  based adjustment of the mA/kV was utilized to reduce the radiation dose to as low as reasonably achievable. COMPARISON: Brain MRI 03/13/2020. CLINICAL HISTORY: 78 year old male. Minor head trauma after a fall at 1 AM. Patient has dementia. Complains of right hip/upper leg pain. FINDINGS: BRAIN AND VENTRICLES: No acute hemorrhage. No evidence of acute infarct. No hydrocephalus. No extra-axial collection. No mass effect or midline shift. Cerebral volume not significantly changed. Gray Eliah Ozawa differentiation is within normal limits for age. No suspicious intracranial vascular hyperdensity. ORBITS: No discrete orbital soft tissue injury identified. SINUSES: Minor paranasal sinus mucosal thickening. SOFT TISSUES AND SKULL: No discrete scalp soft tissue injury identified. No skull fracture. Significant styloid phalanx. IMPRESSION: 1. No acute traumatic injury identified 2. Negative for age non-contrast head CT. Electronically signed by: Helayne Hurst MD 12/26/2023 11:32 AM EDT RP Workstation: [id number]   DG Hip Unilat W or Wo Pelvis 2-3 Views Right Result Date: 12/26/2023 EXAM: 2 or more VIEW(S) XRAY OF THE  RIGHT HIP 12/26/2023 11:02:00 AM COMPARISON: None available. CLINICAL HISTORY: fall. Patient fell this morning around 1am. Patient has Dementia and was picked up by family. This morning patient complained of some pain in right hip and upper leg. Patient is moving his leg. FINDINGS: BONES AND JOINTS: Acute transcervical fracture of the right femoral neck with varus angulation. SOFT TISSUES: Vascular calcifications. IMPRESSION: 1. Acute transcervical fracture of the right femoral neck with varus impaction. Electronically signed by: Helayne Hurst MD 12/26/2023 11:21 AM EDT RP Workstation: [id number]    Positive ROS: All other systems have been reviewed and were otherwise negative with the exception of those mentioned in the HPI and as above.  Physical Exam: General: Alert, no acute distress Cardiovascular: No pedal edema Respiratory: No cyanosis, no use of accessory musculature GI: No organomegaly, abdomen is soft and non-tender Skin: No lesions in the area of chief complaint Neurologic: Sensation intact distally Psychiatric: Patient is demented and not competent for consent Lymphatic: No axillary or cervical lymphadenopathy  MUSCULOSKELETAL:  Right lower extremity - skin intact - TTP right hip - extremity shortened and externally rotated - Able to flex and extend his toes - sensation intact to light touch over the sural, saphenous, tibial, superficial and deep peroneal nerve distributions. - 2+ DP pulse  Imaging:  X-rays: 3 views of  right femur  were obtained and reviewed.  My independent interpretation is as follows: displaced femoral neck fracture with varus malalignment.  Assessment: Right displaced femoral neck fracture  Plan: 78 year old male with a displaced right femoral neck fracture. The patient is demented and history and informed consent was obtained through the patient's healthcare power of attorney, his son Eric Floyd.  Risks, benefits and alternative were reviewed with  the patient's son including, but not limited to infection, blood loss, damage to surrounding structures such as nerves arteries and veins, need for additional procedures in case of dislocation, periprosthetic fracture or future infection. He expressed understanding and agreement with the plan to proceed with a right hip hemiarthroplasty.  Proceed with right hip hemiarthroplasty for displaced femoral neck fracture Pre-op clearance per medicine Informed consent obtained from Eric Floyd, Eric Floyd NPO Pre-op ancef 2 g Pre-op TXA 1 g Dispo: pending OR    Rankin LELON Pizza, MD    12/26/2023 12:40 PM

## 2023-12-26 NOTE — Op Note (Signed)
 OPERATIVE REPORT- HIP HEMI ARTHROPLASTY   PREOPERATIVE DIAGNOSIS: Displaced femoral neck fracture Right hip.   POSTOPERATIVE DIAGNOSIS: Displaced femoral neck fracture Right  hip.   PROCEDURE: Right hip hemi arthroplasty, anterior approach.   SURGEON: Rankin Pizza, MD   ASSISTANT: Rosina Calin, PA  ANESTHESIA:  General  ESTIMATED BLOOD LOSS: 100 cc  DRAINS: None  COMPLICATIONS: None   CONDITION: PACU - hemodynamically stable.   BRIEF CLINICAL NOTE: Eric Floyd is a 78 y.o. male who has a displaced femoral neck fracture of their Right  hip . The patient is indicated to undergo right hip hemiarthroplasty to allow for early mobilization and immediate weight bearing of the Right hip. Risks, benefits and alternatives were discussed with the patient and their family including, but not limited to bleeding, infection, damage to surrounding structures such as nerves, arteries, and veins, dislocation, periprosthetic fracture, loosening, hardware failure and the need for additional procedures. They expressed understanding and agreement with the plan and elected to proceed with Right hip hemiarthroplasty. Informed consent obtained.  The extremity was examined and found to be shortened and externally rotated with intact sensation and motor to the limb and 2+ dp pulse. The extremity was marked at this time.   PROCEDURE IN DETAIL: The patient was brought back to the operating room where they underwent general anesthesia per anesthesia protocol. The traction boots for the [facility] bed were placed on both feet well padded and the patient was placed onto the [place] bed, boots placed into the leg holders. The Right hip was then isolated from the perineum with plastic drapes and prepped and draped in the usual sterile fashion. ASIS and greater trochanter were marked and a oblique incision was made, starting at about 1 cm lateral and 2 cm distal to the ASIS and coursing towards the anterior cortex of the  femur. The skin was cut with a 10 blade  through subcutaneous tissue to the level of the fascia overlying the tensor fascia lata muscle. The fascia was then incised in line with the incision at the junction of the anterior third and posterior 2/3rd. The muscle was teased off the fascia and then the interval between the TFL  and the rectus was developed. The Hohmann retractor was then placed at  the top of the femoral neck over the capsule. The vessels overlying the capsule were cauterized and the fat on top of the capsule was removed. A Hohmann retractor was then placed anterior underneath the rectus femoris to give exposure to the entire anterior capsule. The anterior capsule was excised and the femoral head and neck was identified. The femoral neck was then cut in situ with an oscillating saw. Traction was then applied to the left lower extremity utilizing the [facility] traction. The femoral head was then removed.   The proximal femur was then delivered out of the wound a Davyon Fisch handle femoral retractor was then placed along the medial calcar and the lateral aspect of the femur just distal to the vastus ridge. The leg was  externally rotated and then placed in an extended and adducted position essentially delivering the femur. We also removed the capsule superiorly and the piriformis from the piriformis fossa to gain excellent exposure of the  proximal femur. The proximal femur was then delivered out of the wound a Jadine Brumley handle femoral retractor was then placed along the medial calcar and the lateral aspect of the femur just distal to the vastus ridge along with the matta retractor proximally behind the greater  trochanter. Rongeur was used to remove some cancellous bone to get into the lateral portion of the proximal femur for placement of the initial starter reamer. A cat tail rasp was also used to lateralize the entry to the femur. The starter broaches was placed  and was shown to go down the center of the  canal. Broaching with the [place] system was then performed starting at size 1 coursing up to size 2. A size 2 had excellent torsional and rotational and axial stability. The trial standard offset neck was then placed  with a 28 mm bipolar trial head. The hip was then reduced. We confirmed that the stem was in the canal both on AP and lateral x-rays. It also has excellent sizing. The hip was reduced with outstanding stability through full extension and full external rotation.. AP pelvis was taken and the leg lengths were measured and found to be equal. Hip was then dislocated again and the femoral head and neck removed. The femoral broach was removed. The cement restrictor was placed in the femoral canal and the cement was pressurized during insertion of the final stem. Anesthesia was alerted prior to this step.  Size 2 stem with a standard offset neck was then impacted into the femur following native anteversion. Has excellent purchase in the canal. Excellent torsional and rotational and axial stability. The 28 mm bipolar  head was placed and the hip reduced with outstanding stability. Again AP pelvis was taken and it confirmed that the leg lengths were equal. The wound was then copiously irrigated with saline solution. The deep tissue was closed 0 vicryl.  30 ml of .25% Bupivicaine was  injected into the capsule and into the edge of the tensor fascia lata as well as subcutaneous tissue. The fascia overlying the tensor fascia lata was then closed with a running #0 stratafix. Subcu was closed with interrupted 2-0 Vicryl and subcuticular running 4-0 Monocryl. Incision was cleaned and dried. Skin glue was applied and after it was dried, a mepilex dressing was applied. The patient was awakened and transported to recovery in stable condition.      Please note that a surgical assistant was a medical necessity for this procedure to perform it in a safe and expeditious manner. Assistant was necessary to provide  appropriate retraction of vital neurovascular structures and to prevent femoral fracture and allow for anatomic placement of the prosthesis.  Post operative protocol The patient is to be weight bearing as tolerated to the right lower extremity.  He will begin PT POD 1 and aspirin 81 mg BID x 6 weeks for DVT ppx. He will be given 3 additional doses of IV ancef for antibiotic ppx  Rankin Pizza, M.D.

## 2023-12-26 NOTE — Transfer of Care (Signed)
 Immediate Anesthesia Transfer of Care Note  Patient: Eric Floyd  Procedure(s) Performed: HEMIARTHROPLASTY (BIPOLAR), HIP, DIRECT ANTERIOR APPROACH, FOR FRACTURE (Right: Hip)  Patient Location: PACU  Anesthesia Type:General  Level of Consciousness: drowsy and patient cooperative  Airway & Oxygen Therapy: Patient Spontanous Breathing and Patient connected to nasal cannula oxygen  Post-op Assessment: Report given to RN and Post -op Vital signs reviewed and stable  Post vital signs: Reviewed and stable  Last Vitals:  Vitals Value Taken Time  BP 113/100 12/26/23 20:40  Temp    Pulse 60 12/26/23 20:44  Resp 21 12/26/23 20:44  SpO2 95 % 12/26/23 20:44  Vitals shown include unfiled device data.  Last Pain:  Vitals:   12/26/23 1003  TempSrc: Oral         Complications: No notable events documented.

## 2023-12-26 NOTE — Anesthesia Preprocedure Evaluation (Addendum)
 Anesthesia Evaluation  Patient identified by MRN, date of birth, ID band Patient confused    Reviewed: Allergy & Precautions, H&P , NPO status , Patient's Chart, lab work & pertinent test results  Airway Mallampati: II  TM Distance: >3 FB Neck ROM: Full    Dental no notable dental hx. (+) Teeth Intact, Dental Advisory Given   Pulmonary former smoker   Pulmonary exam normal breath sounds clear to auscultation       Cardiovascular hypertension, negative cardio ROS  Rhythm:Regular Rate:Normal     Neuro/Psych       Dementia negative neurological ROS  negative psych ROS   GI/Hepatic negative GI ROS, Neg liver ROS,,,  Endo/Other  negative endocrine ROS    Renal/GU negative Renal ROS  negative genitourinary   Musculoskeletal   Abdominal   Peds  Hematology negative hematology ROS (+)   Anesthesia Other Findings   Reproductive/Obstetrics negative OB ROS                              Anesthesia Physical Anesthesia Plan  ASA: 2  Anesthesia Plan: General   Post-op Pain Management: Ofirmev IV (intra-op)*   Induction: Intravenous  PONV Risk Score and Plan: 3 and Ondansetron, Dexamethasone and Treatment may vary due to age or medical condition  Airway Management Planned: Oral ETT  Additional Equipment:   Intra-op Plan:   Post-operative Plan: Extubation in OR  Informed Consent: I have reviewed the patients History and Physical, chart, labs and discussed the procedure including the risks, benefits and alternatives for the proposed anesthesia with the patient or authorized representative who has indicated his/her understanding and acceptance.     Dental advisory given  Plan Discussed with: CRNA  Anesthesia Plan Comments:          Anesthesia Quick Evaluation

## 2023-12-26 NOTE — Anesthesia Postprocedure Evaluation (Signed)
 Anesthesia Post Note  Patient: Eric Floyd  Procedure(s) Performed: HEMIARTHROPLASTY (BIPOLAR), HIP, DIRECT ANTERIOR APPROACH, FOR FRACTURE (Right: Hip)     Patient location during evaluation: PACU Anesthesia Type: General Level of consciousness: sedated Pain management: pain level controlled Vital Signs Assessment: post-procedure vital signs reviewed and stable Respiratory status: spontaneous breathing, nonlabored ventilation, respiratory function stable and patient connected to nasal cannula oxygen Cardiovascular status: blood pressure returned to baseline and stable Postop Assessment: no apparent nausea or vomiting Anesthetic complications: no   No notable events documented.  Last Vitals:  Vitals:   12/26/23 2045 12/26/23 2100  BP: 123/84 (!) 114/45  Pulse: (!) 59 (!) 53  Resp: 20 17  Temp:    SpO2: 97% 99%    Last Pain:  Vitals:   12/26/23 1003  TempSrc: Oral                 Lamont Glasscock,W. EDMOND

## 2023-12-26 NOTE — H&P (Signed)
 History and Physical    Patient: Eric Floyd FMW:[id number] DOB: 03-13-1945 DOA: 12/26/2023 DOS: the patient was seen and examined on 12/26/2023 PCP: [facility] Emil Schanz, MD  Patient coming from: Home.  Lives with his wife.  Independently ambulates at baseline  Chief Complaint: No chief complaint on file.  HPI: Eric Floyd is a 78 y.o. male with Alzheimer's dementia, bradycardia and hypertension brought to ED due to right hip pain this morning after he sustained a fall about 1 AM last night.  Patient is awake and alert but only oriented to self.  He tends to speak Spanish and also responds in English.  Attempted to obtain history using Spanish interpreter over the phone.  Patient has no recollection about fall.  Denies pain.  Called patient's son over the phone who confirms fall about 1 AM last night.  Per son, patient lives with his wife and independently ambulates at baseline.  He reported that he had a fall about 1 AM on his way back from bathroom.  Unknown mechanism of fall.  He was found on the floor when his wife woke up.  When his son tried to get him off the floor, patient complained right hip pain and unable to walk.  They called EMS and he was brought to ED.  Per EMS, stable vitals with mild bradycardia to 57.  BG was 134.  In ED, HR in upper 50s.  Other vital stable. Cr 1.42.  BUN 33.  CK 1400.  CT head and cervical spine without acute finding.  Hip x-ray showed acute transcervical fracture of right femoral neck with varus impaction.  Per EDP, orthopedic surgery paged and yet to call back.  Received 1 L fluid bolus.  Admission requested for right hip fracture, Eric and mild rhabdo.  Review of Systems: As mentioned in the history of present illness. All other systems reviewed and are negative. Past Medical History:  Diagnosis Date   Hyperlipidemia    Memory difficulty 02/19/2020   Past Surgical History:  Procedure Laterality Date   NO PAST SURGERIES     Social  History:  reports that he quit smoking about 39 years ago. His smoking use included cigarettes. He has never used smokeless tobacco. He reports current alcohol use of about 21.0 standard drinks of alcohol per week. He reports that he does not use drugs.  No Known Allergies  Family History  Problem Relation Age of Onset   Stroke Mother    Heart disease Brother    Heart attack Brother    Ovarian cancer Niece    Colon cancer Neg Hx    Colon polyps Neg Hx    Esophageal cancer Neg Hx    Rectal cancer Neg Hx    Stomach cancer Neg Hx     Prior to Admission medications   Medication Sig Start Date End Date Taking? Authorizing Provider  Ascorbic Acid (VITAMIN C) 100 MG tablet Take 500 mg by mouth daily.    [provider]  Calcium  Carb-Cholecalciferol (CALCIUM  600 + D PO) Take by mouth.    [provider]  cetirizine (ZYRTEC) 10 MG tablet Take 10 mg by mouth daily.    [provider]  COCONUT OIL PO Take by mouth. Once daily    [provider]  Cyanocobalamin (VITAMIN B-12 PO) Take by mouth.    [provider]  hydrOXYzine (ATARAX) 25 MG tablet Take 1 tablet (25 mg total) by mouth at bedtime as needed. 11/29/23   Sagardia,  Emil Schanz, MD  ipratropium (ATROVENT ) 0.03 % nasal spray USE 2 SPRAY(S) IN EACH NOSTRIL EVERY 12 HOURS 10/03/23   Sagardia, Miguel Jose, MD  lisinopril  (ZESTRIL ) 10 MG tablet Take 1 tablet by mouth once daily 10/05/23   Sagardia, Miguel Jose, MD  memantine  (NAMENDA ) 5 MG tablet Take 1 tablet (5 mg total) by mouth 2 (two) times daily. 06/03/23   [place] Lauraine PARAS, NP  Multiple Vitamin (MULTIVITAMIN) tablet Take 1 tablet by mouth daily.    [provider]  NON FORMULARY Take 1 capsule by mouth daily. [address] MCT Oil    [provider]  NON FORMULARY Take 1 tablet by mouth daily. Focus factor    [provider]  Omega-3 Fatty Acids (FISH OIL OMEGA-3 PO) Take by mouth.    [provider]  rosuvastatin   (CRESTOR ) 10 MG tablet Take 1 tablet by mouth once daily 10/05/23   Sagardia, Miguel Jose, MD  Vitamin D , Ergocalciferol , (DRISDOL ) 1.25 MG (50000 UNIT) CAPS capsule TAKE 1 CAPSULE EVERY 7 DAYS 02/26/20   [facility] Emil Schanz, MD    Physical Exam: Vitals:   12/26/23 1003 12/26/23 1015 12/26/23 1030  BP: 127/61 121/64   Pulse: (!) 57 (!) 57 (!) 58  Resp: 16    Temp: 97.6 F (36.4 C)    TempSrc: Oral    SpO2: 100% 98% 98%   GENERAL: No apparent distress.  Nontoxic. HEENT: MMM.  Vision and hearing grossly intact.  NECK: Supple.  No apparent JVD.  RESP:  No IWOB.  Fair aeration bilaterally. CVS: Cardiac to 40s.  Regular rhythm.  Heart sounds normal.  ABD/GI/GU: BS+. Abd soft, NTND.  MSK/EXT:   No apparent deformity.  No edema.  Tenderness over right hip.  Right leg slightly shortened. SKIN: no apparent skin lesion or wound NEURO: Awake and alert. Oriented to self.  Follows commands.  No apparent focal neuro deficit  PSYCH: Calm. Normal affect.  Data Reviewed: See HPI  Assessment and Plan: Unwitnessed fall at home-reportedly fell about 1 AM on his way back from bathroom. Acute right femoral neck fracture with varus infarction -Patient is ambulatory at baseline. -Ortho paged by EDP.  Waiting on callback. - Bedrest and pain control pending surgery - N.p.o. pending Ortho input - Check vitamin D  level - Fall precaution  Eric: Creatinine 1.42 (was 0.93 in 05/2023).  Received 1 L NS bolus in ED. Recent Labs    05/04/23 1127 12/26/23 1036  BUN 21 32*  CREATININE 0.93 1.42*  - Check bladder scan - IV NS at 100 cc an hour   Myonecrosis: CK elevated 1400.  Likely due to fall - IV fluid as above - Recheck level in the morning  Essential hypertension: Normotensive - Hold lisinopril  in the setting of Eric  Sinus bradycardia: Chronic.  HR in upper 50s.  Not on nodal blocking agents. - Avoid nodal blocking agent  Severe Alzheimer's dementia without behavioral disturbance: Awake but  only oriented to self. - Reorientation and delirium precaution - Continue home memantine  after med rec    Advance Care Planning:   Code Status: Full Code-discussed with patient's son over the phone.  Consults: Orthopedic surgery  Family Communication: Updated patient's son over the phone  Severity of Illness: The appropriate patient status for this patient is INPATIENT. Inpatient status is judged to be reasonable and necessary in order to provide the required intensity of service to ensure the patient's safety. The patient's presenting symptoms, physical exam findings, and initial radiographic  and laboratory data in the context of their chronic comorbidities is felt to place them at high risk for further clinical deterioration. Furthermore, it is not anticipated that the patient will be medically stable for discharge from the hospital within 2 midnights of admission.   * I certify that at the point of admission it is my clinical judgment that the patient will require inpatient hospital care spanning beyond 2 midnights from the point of admission due to high intensity of service, high risk for further deterioration and high frequency of surveillance required.*  Author: Mignon ONEIDA Bump, MD 12/26/2023 12:30 PM  For on call review [email or web address].

## 2023-12-26 NOTE — Anesthesia Procedure Notes (Signed)
 Procedure Name: Intubation Date/Time: 12/26/2023 5:40 PM  Performed by: Para Jerelene CROME, CRNAPre-anesthesia Checklist: Patient identified, Emergency Drugs available, Suction available and Patient being monitored Patient Re-evaluated:Patient Re-evaluated prior to induction Oxygen Delivery Method: Circle system utilized Preoxygenation: Pre-oxygenation with 100% oxygen Induction Type: IV induction Ventilation: Mask ventilation without difficulty Laryngoscope Size: Mac and 4 Grade View: Grade I Tube type: Oral Tube size: 7.0 mm Number of attempts: 1 Airway Equipment and Method: Stylet Placement Confirmation: ETT inserted through vocal cords under direct vision, positive ETCO2, CO2 detector and breath sounds checked- equal and bilateral Secured at: 22 (OETT secured 22 cm at lower lip.) cm Tube secured with: Tape Dental Injury: Teeth and Oropharynx as per pre-operative assessment  Comments: Atraumatic intubation x 1, Patient edentulous. Lips remain in preoperative condition.

## 2023-12-26 NOTE — ED Triage Notes (Signed)
 Patient fell this morning around 1am. Patient has Dementia and was picked up by family.  This morning patient complained of some pain in right hip and upper leg. Patient is moving his leg and no shorting/rotation observed.    Per EMS: 57 126/74 18 98.1 CBG 134

## 2023-12-26 NOTE — Plan of Care (Signed)
Discuss and review plan of care with patient/family  

## 2023-12-27 ENCOUNTER — Encounter (HOSPITAL_COMMUNITY): Payer: Self-pay

## 2023-12-27 DIAGNOSIS — S72001A Fracture of unspecified part of neck of right femur, initial encounter for closed fracture: Secondary | ICD-10-CM | POA: Diagnosis not present

## 2023-12-27 LAB — COMPREHENSIVE METABOLIC PANEL WITH GFR
ALT: 20 U/L (ref 0–44)
AST: 55 U/L — ABNORMAL HIGH (ref 15–41)
Albumin: 3.7 g/dL (ref 3.5–5.0)
Alkaline Phosphatase: 53 U/L (ref 38–126)
Anion gap: 10 (ref 5–15)
BUN: 35 mg/dL — ABNORMAL HIGH (ref 8–23)
CO2: 24 mmol/L (ref 22–32)
Calcium: 8.8 mg/dL — ABNORMAL LOW (ref 8.9–10.3)
Chloride: 100 mmol/L (ref 98–111)
Creatinine, Ser: 1.29 mg/dL — ABNORMAL HIGH (ref 0.61–1.24)
GFR, Estimated: 57 mL/min — ABNORMAL LOW (ref >60)
Glucose, Bld: 152 mg/dL — ABNORMAL HIGH (ref 70–99)
Potassium: 4.7 mmol/L (ref 3.5–5.1)
Sodium: 134 mmol/L — ABNORMAL LOW (ref 135–145)
Total Bilirubin: 0.4 mg/dL (ref 0.0–1.2)
Total Protein: 6.7 g/dL (ref 6.5–8.1)

## 2023-12-27 LAB — CBC
HCT: 32.7 % — ABNORMAL LOW (ref 39.0–52.0)
Hemoglobin: 10.4 g/dL — ABNORMAL LOW (ref 13.0–17.0)
MCH: 30.3 pg (ref 26.0–34.0)
MCHC: 31.8 g/dL (ref 30.0–36.0)
MCV: 95.3 fL (ref 80.0–100.0)
Platelets: 152 K/uL (ref 150–400)
RBC: 3.43 MIL/uL — ABNORMAL LOW (ref 4.22–5.81)
RDW: 13.1 % (ref 11.5–15.5)
WBC: 6.3 K/uL (ref 4.0–10.5)
nRBC: 0 % (ref 0.0–0.2)

## 2023-12-27 LAB — CK: Total CK: 1171 U/L — ABNORMAL HIGH (ref 49–397)

## 2023-12-27 NOTE — Evaluation (Signed)
 Occupational Therapy Evaluation Patient Details Name: Eric Floyd MRN: [id number] DOB: 1945/06/27 Today's Date: 12/27/2023   History of Present Illness   Eric Floyd is a 78 y.o. male presents after a fall with Displaced femoral neck fracture Right hip; s/p Right hip hemi arthroplasty, anterior approach 10/26. PMH: Alzheimer's dementia, bradycardia and hypertension     Clinical Impressions PTA, patient lives with wife and was relatively independent within the home for amb and BADL's with wife assisting with IADL's and community mobility due to baseline cog/memory deficits.  Wife and dtr present bedside for Spanish translation and patient cooperative and open to all therapy presented. Currently, patient presents with deficits outlined below (see OT Problem List for details) most significantly pain, balance, activity tolerance and cognitive deficits limiting BADL's (mod-max A LB) and functional mobility (min A RW short chair to/from bathroom amb) performance.  Patient will benefit from continued inpatient follow up therapy, <3 hours/day. Patient requires continued Acute care hospital level OT services to progress safety and functional performance and allow for discharge.       If plan is discharge home, recommend the following:   A lot of help with walking and/or transfers;A lot of help with bathing/dressing/bathroom;Assistance with cooking/housework;Direct supervision/assist for medications management;Direct supervision/assist for financial management;Assist for transportation;Help with stairs or ramp for entrance;Supervision due to cognitive status     Functional Status Assessment   Patient has had a recent decline in their functional status and demonstrates the ability to make significant improvements in function in a reasonable and predictable amount of time.     Equipment Recommendations   BSC/3in1;Tub/shower seat;Other (comment) (RW)       Precautions/Restrictions   Precautions Precautions: Fall Restrictions Weight Bearing Restrictions Per Provider Order: No     Mobility Bed Mobility Overal bed mobility:  (patient in recliner, post session remained)                  Transfers Overall transfer level: Needs assistance Equipment used: Rolling walker (2 wheels) Transfers: Sit to/from Stand Sit to Stand: Min assist           General transfer comment: min A to power up from bedside adn recliner, slow to rise, cues for hand placement as pt prefers to pull from RW without success      Balance Overall balance assessment: Needs assistance, History of Falls Sitting-balance support: Feet supported Sitting balance-Leahy Scale: Fair     Standing balance support: Reliant on assistive device for balance, During functional activity, Bilateral upper extremity supported Standing balance-Leahy Scale: Poor                             ADL either performed or assessed with clinical judgement   ADL Overall ADL's : Needs assistance/impaired Eating/Feeding: Set up;Sitting   Grooming: Wash/dry hands;Wash/dry face;Oral care;Contact guard assist;Sitting;Cueing for sequencing   Upper Body Bathing: Contact guard assist;Sitting;Cueing for sequencing   Lower Body Bathing: Maximal assistance;Sit to/from stand;Cueing for sequencing;Cueing for safety   Upper Body Dressing : Minimal assistance;Sitting;Cueing for sequencing   Lower Body Dressing: Maximal assistance;Sit to/from stand;Cueing for safety;Cueing for sequencing   Toilet Transfer: Minimal assistance;Rolling walker (2 wheels);Ambulation;Regular Toilet;Grab bars;Cueing for sequencing;Cueing for safety   Toileting- Clothing Manipulation and Hygiene: Moderate assistance;Sitting/lateral lean       Functional mobility during ADLs: Minimal assistance;Rolling walker (2 wheels) General ADL Comments: unable to complete functional reach to LE's for ADL's  Vision Baseline Vision/History: 0 No visual deficits              Pertinent Vitals/Pain Pain Assessment Pain Assessment: Faces Faces Pain Scale: Hurts a little bit Breathing: normal Negative Vocalization: none Facial Expression: smiling or inexpressive Body Language: relaxed Consolability: no need to console PAINAD Score: 0 Pain Location: R hip Pain Descriptors / Indicators: Discomfort Pain Intervention(s): Limited activity within patient's tolerance, Monitored during session, Premedicated before session, Repositioned     Extremity/Trunk Assessment Upper Extremity Assessment Upper Extremity Assessment: Right hand dominant;Overall [facility] for tasks assessed   Lower Extremity Assessment Lower Extremity Assessment: Defer to PT evaluation   Cervical / Trunk Assessment Cervical / Trunk Assessment: Kyphotic   Communication Communication Communication: Impaired Factors Affecting Communication: Non - English speaking, interpreter not available (wife translated)   Cognition Arousal: Alert Behavior During Therapy: WFL for tasks assessed/performed Cognition: History of cognitive impairments             OT - Cognition Comments: wife and dtr present for Spanish translatiohn and report patient with STM and safety deficits requiring 24/7 S at baseline and no changes noted, patient pleasant and cooperative and responded well to all translation and cg support                 Following commands: Impaired Following commands impaired: Follows one step commands inconsistently     Cueing  General Comments   Cueing Techniques: Verbal cues;Gestural cues;Tactile cues  post op hip incision covered with dressing intact, no edema, SOB noted           Home Living Family/patient expects to be discharged to:: Private residence Living Arrangements: Spouse/significant other Available Help at Discharge: Family Type of Home: House Home Access: Stairs to enter Water Quality Scientist of Steps: 5 Entrance Stairs-Rails: Right;Left Home Layout: Multi-level Alternate Level Stairs-Number of Steps: 4 Alternate Level Stairs-Rails: Right Bathroom Shower/Tub: Producer, Television/film/video: Standard Bathroom Accessibility: Yes   Home Equipment: None          Prior Functioning/Environment Prior Level of Function : Independent/Modified Independent;Needs assist             Mobility Comments: spouse reports pt ind with in home amb, ambulates arm in arm with spouse when exiting the home but mostly a household ambulator ADLs Comments: spouse reports pt ind with bathing, toileting, dressing; spouse completes household chores    OT Problem List: Impaired balance (sitting and/or standing);Decreased activity tolerance;Decreased cognition;Decreased safety awareness;Pain   OT Treatment/Interventions: Self-care/ADL training;Therapeutic exercise;Neuromuscular education;Energy conservation;DME and/or AE instruction;Patient/family education;Balance training;Cognitive remediation/compensation;Therapeutic activities      OT Goals(Current goals can be found in the care plan section)   Acute Rehab OT Goals Patient Stated Goal: to walk OT Goal Formulation: With patient/family Time For Goal Achievement: 01/10/24 Potential to Achieve Goals: Fair ADL Goals Pt Will Perform Lower Body Bathing: with adaptive equipment;with contact guard assist;sit to/from stand Pt Will Perform Lower Body Dressing: with min assist;sit to/from stand;with adaptive equipment Pt Will Transfer to Toilet: with contact guard assist;ambulating;regular height toilet;grab bars Pt Will Perform Toileting - Clothing Manipulation and hygiene: with contact guard assist;sit to/from stand Pt Will Perform Tub/Shower Transfer: Shower transfer;with min assist;shower seat;grab bars;rolling walker   OT Frequency:  Min 2X/week       AM-PAC OT 6 Clicks Daily Activity     Outcome Measure Help from  another person eating meals?: A Little Help from another person taking care of personal grooming?: A Little Help from  another person toileting, which includes using toliet, bedpan, or urinal?: A Lot Help from another person bathing (including washing, rinsing, drying)?: A Lot Help from another person to put on and taking off regular upper body clothing?: A Little Help from another person to put on and taking off regular lower body clothing?: A Lot 6 Click Score: 15   End of Session Equipment Utilized During Treatment: Gait belt;Rolling walker (2 wheels) Nurse Communication: Mobility status  Activity Tolerance: Patient tolerated treatment well Patient left: in chair;with call bell/phone within reach;with chair alarm set;with family/visitor present  OT Visit Diagnosis: Unsteadiness on feet (R26.81);History of falling (Z91.81);Pain;Cognitive communication deficit (R41.841) Pain - Right/Left: Right Pain - part of body: Hip                Time: 8784-8754 OT Time Calculation (min): 30 min Charges:  OT General Charges $OT Visit: 1 Visit OT Evaluation $OT Eval Low Complexity: 1 Low OT Treatments $Self Care/Home Management : 8-22 mins  Thayden Lemire OT/L Acute Rehabilitation Department  [phone]  12/27/2023, 2:33 PM

## 2023-12-27 NOTE — NC FL2 (Addendum)
 [place]  MEDICAID FL2 LEVEL OF CARE FORM     IDENTIFICATION  Patient Name: Eric Floyd Birthdate: [birth year] Sex: male Admission Date (Current Location): 12/26/2023  [facility] and [place] Number:  Producer, Television/film/video and Address:  [facility],  [address] [place], [place] [address]      Provider Number: [id number]  Attending Physician Name and Address:  Lenon Marien CROME, MD  Relative Name and Phone Number:  [place], CREHAN)  [phone] [facility])    Current Level of Care: Hospital Recommended Level of Care: Skilled Nursing Facility Prior Approval Number:    Date Approved/Denied:   PASRR Number: [id number]  Discharge Plan: SNF    Current Diagnoses: Patient Active Problem List   Diagnosis Date Noted   Right femoral fracture (HCC) 12/26/2023   History of right hip hemiarthroplasty 12/26/2023   Chronic insomnia 11/29/2023   Sinus bradycardia on ECG 08/29/2020   Alzheimer's disease (HCC) 06/19/2020   Essential hypertension 06/19/2020   Diverticulosis 06/19/2020   Memory difficulty 02/19/2020   Vitamin D  deficiency 12/21/2019   Dyslipidemia 12/21/2019    Orientation RESPIRATION BLADDER Height & Weight     Self  Normal Incontinent Weight: 146 lb (66.2 kg) Height:  5' 4 (162.6 cm)  BEHAVIORAL SYMPTOMS/MOOD NEUROLOGICAL BOWEL NUTRITION STATUS      Incontinent Diet (see dc summary)  AMBULATORY STATUS COMMUNICATION OF NEEDS Skin   Limited Assist Verbally Normal                       Personal Care Assistance Level of Assistance  Bathing, Feeding, Dressing Bathing Assistance: Limited assistance Feeding assistance: Limited assistance Dressing Assistance: Limited assistance     Functional Limitations Info  Speech, Hearing, Sight Sight Info: Adequate Hearing Info: Adequate Speech Info: Adequate    SPECIAL CARE FACTORS FREQUENCY  PT (By licensed PT), OT (By licensed OT)     PT Frequency: 5x/wk OT Frequency: 5x/wk             Contractures Contractures Info: Not present    Additional Factors Info  Code Status, Allergies Code Status Info: Full code Allergies Info: NKA           Current Medications (12/27/2023):  This is the current hospital active medication list Current Facility-Administered Medications  Medication Dose Route Frequency Provider Last Rate Last Admin   0.9 %  sodium chloride  infusion   Intravenous Continuous Donovan, Ashley R, PA-C 75 mL/hr at 12/26/23 2249 New Bag at 12/26/23 2249   acetaminophen (TYLENOL) tablet 1,000 mg  1,000 mg Oral Q8H Patti Rosina SAUNDERS, PA-C   1,000 mg at 12/27/23 1359   acetaminophen (TYLENOL) tablet 325-650 mg  325-650 mg Oral Q6H PRN Patti Rosina SAUNDERS, PA-C       alum & mag hydroxide-simeth (MAALOX/MYLANTA) 200-200-20 MG/5ML suspension 30 mL  30 mL Oral Q4H PRN Patti Rosina SAUNDERS, PA-C       aspirin chewable tablet 81 mg  81 mg Oral BID Donovan, Ashley R, PA-C   81 mg at 12/27/23 0934   bisacodyl (DULCOLAX) suppository 10 mg  10 mg Rectal Daily PRN Patti Rosina SAUNDERS, PA-C       diphenhydrAMINE (BENADRYL) 12.5 MG/5ML elixir 12.5-25 mg  12.5-25 mg Oral Q4H PRN Patti Rosina SAUNDERS, PA-C   25 mg at 12/27/23 0124   HYDROmorphone (DILAUDID) injection 0.5-1 mg  0.5-1 mg Intravenous Q4H PRN Patti Rosina SAUNDERS, PA-C       menthol (CEPACOL) lozenge 3 mg  1 lozenge  Oral PRN Patti Rosina SAUNDERS, PA-C       Or   phenol (CHLORASEPTIC) mouth spray 1 spray  1 spray Mouth/Throat PRN Patti Rosina SAUNDERS, PA-C       methocarbamol (ROBAXIN) tablet 500 mg  500 mg Oral Q6H PRN Patti Rosina SAUNDERS, PA-C       Or   methocarbamol (ROBAXIN) injection 500 mg  500 mg Intravenous Q6H PRN Patti Rosina SAUNDERS, PA-C       metoCLOPramide (REGLAN) tablet 5-10 mg  5-10 mg Oral Q8H PRN Patti Rosina SAUNDERS, PA-C       Or   metoCLOPramide (REGLAN) injection 5-10 mg  5-10 mg Intravenous Q8H PRN Patti Rosina SAUNDERS, PA-C       ondansetron (ZOFRAN) tablet 4 mg  4 mg Oral Q6H PRN Patti Rosina SAUNDERS, PA-C       Or    ondansetron (ZOFRAN) injection 4 mg  4 mg Intravenous Q6H PRN Patti Rosina SAUNDERS, PA-C       oxyCODONE (Oxy IR/ROXICODONE) immediate release tablet 10 mg  10 mg Oral Q4H PRN Patti Rosina SAUNDERS, PA-C       oxyCODONE (Oxy IR/ROXICODONE) immediate release tablet 5 mg  5 mg Oral Q4H PRN Patti Rosina R, PA-C       polyethylene glycol (MIRALAX / GLYCOLAX) packet 17 g  17 g Oral BID Patti Rosina SAUNDERS, PA-C   17 g at 12/27/23 9065   senna (SENOKOT) tablet 17.2 mg  2 tablet Oral QHS Patti Rosina SAUNDERS, PA-C   17.2 mg at 12/26/23 2246     Discharge Medications: Please see discharge summary for a list of discharge medications.  Relevant Imaging Results:  Relevant Lab Results:   Additional Information SSN [id number]  Sheri ONEIDA Sharps, LCSW

## 2023-12-27 NOTE — Plan of Care (Signed)
  Problem: Education: Goal: Knowledge of General Education information will improve Description: Including pain rating scale, medication(s)/side effects and non-pharmacologic comfort measures Outcome: Progressing   Problem: Health Behavior/Discharge Planning: Goal: Ability to manage health-related needs will improve Outcome: Progressing   Problem: Clinical Measurements: Goal: Ability to maintain clinical measurements within normal limits will improve Outcome: Adequate for Discharge Goal: Will remain free from infection Outcome: Progressing Goal: Diagnostic test results will improve Outcome: Progressing Goal: Respiratory complications will improve Outcome: Progressing Goal: Cardiovascular complication will be avoided Outcome: Progressing   Problem: Activity: Goal: Risk for activity intolerance will decrease Outcome: Adequate for Discharge   Problem: Nutrition: Goal: Adequate nutrition will be maintained Outcome: Adequate for Discharge   Problem: Coping: Goal: Level of anxiety will decrease Outcome: Progressing   Problem: Elimination: Goal: Will not experience complications related to bowel motility Outcome: Completed/Met Goal: Will not experience complications related to urinary retention Outcome: Completed/Met   Problem: Pain Managment: Goal: General experience of comfort will improve and/or be controlled Outcome: Adequate for Discharge   Problem: Safety: Goal: Ability to remain free from injury will improve Outcome: Progressing   Problem: Skin Integrity: Goal: Risk for impaired skin integrity will decrease Outcome: Adequate for Discharge   Problem: Education: Goal: Knowledge of the prescribed therapeutic regimen will improve Outcome: Progressing Goal: Understanding of discharge needs will improve Outcome: Progressing Goal: Individualized Educational Video(s) Outcome: Progressing   Problem: Activity: Goal: Ability to avoid complications of mobility  impairment will improve Outcome: Adequate for Discharge Goal: Ability to tolerate increased activity will improve Outcome: Adequate for Discharge   Problem: Clinical Measurements: Goal: Postoperative complications will be avoided or minimized Outcome: Adequate for Discharge   Problem: Pain Management: Goal: Pain level will decrease with appropriate interventions Outcome: Adequate for Discharge

## 2023-12-27 NOTE — TOC Initial Note (Signed)
 Transition of Care [facility]) - Initial/Assessment Note    Patient Details  Name: Eric Floyd MRN: [id number] Date of Birth: [birth year]  [facility]) CM/SW Contact:    Sheri ONEIDA Sharps, LCSW Phone Number: 12/27/2023, 4:51 PM  Clinical Narrative:                 Pt from home w/ family. Pt recommended for SNF. CSW spoke w/ pt son Bernida, family accepting of rec. PASRR obtained and FL2 completed. SNF ref faxed; awaiting bed offers.   Expected Discharge Plan: Skilled Nursing Facility Barriers to Discharge: Continued Medical Work up   Patient Goals and CMS Choice Patient states their goals for this hospitalization and ongoing recovery are:: return home CMS Medicare.gov Compare Post Acute Care list provided to:: Patient Choice offered to / list presented to : Adult Children [place] ownership interest in [facility].provided to:: Adult Children    Expected Discharge Plan and Services In-house Referral: NA Discharge Planning Services: NA Post Acute Care Choice: Skilled Nursing Facility Living arrangements for the past 2 months: Single Family Home                 DME Arranged: N/A DME Agency: NA       HH Arranged: NA HH Agency: NA        Prior Living Arrangements/Services Living arrangements for the past 2 months: Single Family Home Lives with:: Adult Children Patient language and need for interpreter reviewed:: Yes Do you feel safe going back to the place where you live?: Yes      Need for Family Participation in Patient Care: Yes (Comment) Care giver support system in place?: Yes (comment)   Criminal Activity/Legal Involvement Pertinent to Current Situation/Hospitalization: No - Comment as needed  Activities of Daily Living   ADL Screening (condition at time of admission) Independently performs ADLs?: Yes (appropriate for developmental age) Is the patient deaf or have difficulty hearing?: No Does the patient have difficulty seeing, even when  wearing glasses/contacts?: No Does the patient have difficulty concentrating, remembering, or making decisions?: No  Permission Sought/Granted                  Emotional Assessment Appearance:: Appears stated age     Orientation: : Oriented to Self Alcohol / Substance Use: Not Applicable Psych Involvement: No (comment)  Admission diagnosis:  Right femoral fracture (HCC) [S72.91XA] Patient Active Problem List   Diagnosis Date Noted   Right femoral fracture (HCC) 12/26/2023   History of right hip hemiarthroplasty 12/26/2023   Chronic insomnia 11/29/2023   Sinus bradycardia on ECG 08/29/2020   Alzheimer's disease (HCC) 06/19/2020   Essential hypertension 06/19/2020   Diverticulosis 06/19/2020   Memory difficulty 02/19/2020   Vitamin D  deficiency 12/21/2019   Dyslipidemia 12/21/2019   PCP:  Purcell Emil Schanz, MD Pharmacy:   [facility] [address], [place] - [address] [place] [address] Phone: [phone] Fax: [phone]     Social Drivers of Health (SDOH) Social History: SDOH Screenings   Food Insecurity: No Food Insecurity (12/26/2023)  Housing: Low Risk  (12/26/2023)  Transportation Needs: No Transportation Needs (12/26/2023)  Utilities: Not At Risk (12/26/2023)  Alcohol Screen: Low Risk  (08/18/2023)  Depression (PHQ2-9): Low Risk  (11/29/2023)  Financial Resource Strain: Low Risk  (08/18/2023)  Physical Activity: Inactive (08/18/2023)  Social Connections: Moderately Isolated (12/26/2023)  Stress: No Stress Concern Present (08/18/2023)  Tobacco Use: Medium Risk (12/26/2023)  Health  Literacy: Inadequate Health Literacy (08/18/2023)   SDOH Interventions:     Readmission Risk Interventions     No data to display

## 2023-12-27 NOTE — Progress Notes (Signed)
 PROGRESS NOTE Eric Floyd    DOB: [birth year], 78 y.o.  FMW:[id number]    Code Status: Full Code   DOA: 12/26/2023   LOS: 1  Brief hospital course  Eric Floyd is a 78 y.o. male with a PMH significant for Alzheimer's dementia, bradycardia and hypertension brought to ED due to right hip pain after he sustained a fall. independently ambulates at baseline.   ED course: HR in upper 50s.  Other vital stable. Cr 1.42.  BUN 33.  CK 1400.  CT head and cervical spine without acute finding.   Hip x-ray showed acute transcervical fracture of right femoral neck with varus impaction.  Received 1 L fluid bolus for AKI and mild rhabdo. Ortho surgery was consulted and performed ORIF 10/26.   12/27/23 -patient tolerated procedure well. Denies pain today. Pending PT/OT eval  Assessment & Plan  Principal Problem:   Right femoral fracture (HCC) Active Problems:   History of right hip hemiarthroplasty  Unwitnessed fall at home Acute right femoral neck fracture with varus infarction S/p ORIF 10/26 -Pt/ot evel- recommending SNF - Fall precaution   AKI: Creatinine 1.42 (was 0.93 in 05/2023).  Improved to 1.29 today s/p IVF - monitor   Myonecrosis: CK elevated 1400>>1171   Essential hypertension: Normotensive - Hold lisinopril  in the setting of AKI   Sinus bradycardia: Chronic.  HR in upper 50s.  Not on nodal blocking agents. - Avoid nodal blocking agent   Moderate Alzheimer's dementia without behavioral disturbance - Reorientation and delirium precaution - Continue home memantine  after med rec  There is no height or weight on file to calculate BMI.  VTE ppx: SCDs Start: 12/26/23 2157 Place TED hose Start: 12/26/23 2157 SCDs Start: 12/26/23 1211  Diet:     Diet   Diet regular Room service appropriate? Yes; Fluid consistency: Thin   Consultants: Ortho surgery   Subjective 12/27/23    Pt reports feeling well. He denies pain at rest. He is eating breakfast.    Objective   Blood pressure (!) 152/64, pulse (!) 59, temperature 98.1 F (36.7 C), temperature source Oral, resp. rate 18, SpO2 97%.  Intake/Output Summary (Last 24 hours) at 12/27/2023 0717 Last data filed at 12/27/2023 0600 Gross per 24 hour  Intake 2510 ml  Output 750 ml  Net 1760 ml   There were no vitals filed for this visit.   Physical Exam:  General: awake, alert, NAD HEENT: atraumatic, clear conjunctiva, anicteric sclera, MMM, hearing grossly normal Respiratory: normal respiratory effort. Nervous: A&O x3. no gross focal neurologic deficits, normal speech Extremities: moves all equally, no edema, normal tone. Surgical incision clean without drainage Skin: dry, intact, normal temperature, normal color. No rashes, lesions or ulcers on exposed skin Psychiatry: normal mood, congruent affect  Labs   I have personally reviewed the following labs and imaging studies CBC    Component Value Date/Time   WBC 6.3 12/27/2023 0317   RBC 3.43 (L) 12/27/2023 0317   HGB 10.4 (L) 12/27/2023 0317   HGB 15.7 12/20/2019 1159   HCT 32.7 (L) 12/27/2023 0317   HCT 46.1 12/20/2019 1159   PLT 152 12/27/2023 0317   PLT 240 12/20/2019 1159   MCV 95.3 12/27/2023 0317   MCV 95 12/20/2019 1159   MCH 30.3 12/27/2023 0317   MCHC 31.8 12/27/2023 0317   RDW 13.1 12/27/2023 0317   RDW 13.1 12/20/2019 1159   LYMPHSABS 1.2 05/04/2023 1127   LYMPHSABS 1.2 12/20/2019 1159   MONOABS 0.4 05/04/2023 1127  EOSABS 0.1 05/04/2023 1127   EOSABS 0.2 12/20/2019 1159   BASOSABS 0.0 05/04/2023 1127   BASOSABS 0.0 12/20/2019 1159      Latest Ref Rng & Units 12/27/2023    3:17 AM 12/26/2023   10:36 AM 05/04/2023   11:27 AM  BMP  Glucose 70 - 99 mg/dL 847  875  89   BUN 8 - 23 mg/dL 35  32  21   Creatinine 0.61 - 1.24 mg/dL 8.70  8.57  9.06   Sodium 135 - 145 mmol/L 134  133  138   Potassium 3.5 - 5.1 mmol/L 4.7  4.9  4.1   Chloride 98 - 111 mmol/L 100  95  100   CO2 22 - 32 mmol/L 24  27  30    Calcium  8.9 - 10.3  mg/dL 8.8  89.8  9.4     DG Pelvis Portable Result Date: 12/26/2023 EXAM: 1 Or 2 View(S) Xray Of The Pelvis 12/26/2023 09:26:00 Pm COMPARISON: None Available. CLINICAL HISTORY: S/P Total Hip Arthroplasty [id number]. S/P Total Right Hip Arthroplasty FINDINGS: BONES AND JOINTS: Right Hip Bipolar Hemiarthroplasty In Expected Position. No Unexpected Fracture Or Dislocation. SOFT TISSUES: Expected Postoperative Changes Including Subcutaneous Air. Vascular Calcifications. IMPRESSION: 1. Right hip bipolar hemiarthroplasty in expected position. No unexpected fracture or dislocation. Electronically signed by: Dorethia Molt MD 12/26/2023 09:54 PM EDT RP Workstation: [id number]   DG HIP UNILAT WITH PELVIS 1V RIGHT Result Date: 12/26/2023 EXAM: 2 VIEW(S) XRAY OF THE RIGHT HIP 12/26/2023 08:29:13 PM COMPARISON: None available. CLINICAL HISTORY: [id number] Surgery, elective [id number]. Right bipolar anterior hip replacement; Fluoro time and dose- 12 seconds; 1.26 mGy FINDINGS: BONES AND JOINTS: There is a new right hip arthroplasty in anatomic alignment. SOFT TISSUES: The soft tissues are unremarkable. IMPRESSION: 1. New right hip arthroplasty in anatomic alignment. Electronically signed by: Greig Pique MD 12/26/2023 08:31 PM EDT RP Workstation: [id number]   DG C-Arm 1-60 Min-No Report Result Date: 12/26/2023 Fluoroscopy was utilized by the requesting physician.  No radiographic interpretation.   DG C-Arm 1-60 Min-No Report Result Date: 12/26/2023 Fluoroscopy was utilized by the requesting physician.  No radiographic interpretation.   DG C-Arm 1-60 Min-No Report Result Date: 12/26/2023 Fluoroscopy was utilized by the requesting physician.  No radiographic interpretation.   CT Hip Right Wo Contrast Result Date: 12/26/2023 CLINICAL DATA:  Hip pain after fall, femoral neck fracture on radiograph. EXAM: CT OF THE RIGHT HIP WITHOUT CONTRAST TECHNIQUE: Multidetector CT imaging of the right hip was performed according to  the standard protocol. Multiplanar CT image reconstructions were also generated. RADIATION DOSE REDUCTION: This exam was performed according to the departmental dose-optimization program which includes automated exposure control, adjustment of the mA and/or kV according to patient size and/or use of iterative reconstruction technique. COMPARISON:  Radiograph earlier today FINDINGS: Bones/Joint/Cartilage Comminuted and displaced transcervical femoral neck fracture. Mild apex anterior angulation and proximal migration of the femoral shaft. Femoral head remains seated in the acetabulum. Mild right hip osteoarthritis. Bone island in the right acetabulum. The pubic rami are intact. Ligaments Suboptimally assessed by CT. Muscles and Tendons Mild edema in the periarticular musculature related to fracture. Soft tissues Soft tissue edema laterally. Vascular calcifications. Enlarged prostate gland. The urinary bladder is distended. IMPRESSION: Comminuted and displaced femoral neck fracture. Electronically Signed   By: Andrea Gasman M.D.   On: 12/26/2023 13:28   CT Cervical Spine Wo Contrast Result Date: 12/26/2023 EXAM: CT CERVICAL SPINE WITHOUT CONTRAST 12/26/2023 11:27:34 AM TECHNIQUE: CT of the cervical spine  was performed without the administration of intravenous contrast. Multiplanar reformatted images are provided for review. Automated exposure control, iterative reconstruction, and/or weight based adjustment of the mA/kV was utilized to reduce the radiation dose to as low as reasonably achievable. COMPARISON: Head CT reported separately today. CLINICAL HISTORY: 78 year old male with neck trauma after a fall this morning. Patient has dementia and right hip/upper leg pain. FINDINGS: CERVICAL SPINE: BONES AND ALIGNMENT: No acute fracture or traumatic malalignment. Degenerative reversal of the normal cervical lordosis. Mild dextroconvex cervical scoliosis. DEGENERATIVE CHANGES: Bulky anterior endplate osteophytes  C4-C5 through C6-C7. Severe disc space loss at those levels. Some vacuum disc. Multilevel mild cervical spinal stenosis suspected. Degenerative ankylosis of the left C2-C3 facets. SOFT TISSUES: No prevertebral soft tissue swelling. Mild for age cervical carotid calcified atherosclerosis. Mild apical lung scarring at the thoracic inlet. IMPRESSION: 1. No acute traumatic injury identified in the cervical spine. 2. Advanced cervical degeneration with suspected multilevel mild cervical spinal stenosis. Electronically signed by: Helayne Hurst MD 12/26/2023 11:35 AM EDT RP Workstation: [id number]   CT Head Wo Contrast Result Date: 12/26/2023 EXAM: CT HEAD WITHOUT CONTRAST 12/26/2023 11:27:34 AM TECHNIQUE: CT of the head was performed without the administration of intravenous contrast. Automated exposure control, iterative reconstruction, and/or weight based adjustment of the mA/kV was utilized to reduce the radiation dose to as low as reasonably achievable. COMPARISON: Brain MRI 03/13/2020. CLINICAL HISTORY: 78 year old male. Minor head trauma after a fall at 1 AM. Patient has dementia. Complains of right hip/upper leg pain. FINDINGS: BRAIN AND VENTRICLES: No acute hemorrhage. No evidence of acute infarct. No hydrocephalus. No extra-axial collection. No mass effect or midline shift. Cerebral volume not significantly changed. Gray white differentiation is within normal limits for age. No suspicious intracranial vascular hyperdensity. ORBITS: No discrete orbital soft tissue injury identified. SINUSES: Minor paranasal sinus mucosal thickening. SOFT TISSUES AND SKULL: No discrete scalp soft tissue injury identified. No skull fracture. Significant styloid phalanx. IMPRESSION: 1. No acute traumatic injury identified 2. Negative for age non-contrast head CT. Electronically signed by: Helayne Hurst MD 12/26/2023 11:32 AM EDT RP Workstation: [id number]   DG Hip Unilat W or Wo Pelvis 2-3 Views Right Result Date:  12/26/2023 EXAM: 2 or more VIEW(S) XRAY OF THE RIGHT HIP 12/26/2023 11:02:00 AM COMPARISON: None available. CLINICAL HISTORY: fall. Patient fell this morning around 1am. Patient has Dementia and was picked up by family. This morning patient complained of some pain in right hip and upper leg. Patient is moving his leg. FINDINGS: BONES AND JOINTS: Acute transcervical fracture of the right femoral neck with varus angulation. SOFT TISSUES: Vascular calcifications. IMPRESSION: 1. Acute transcervical fracture of the right femoral neck with varus impaction. Electronically signed by: Helayne Hurst MD 12/26/2023 11:21 AM EDT RP Workstation: [id number]    Disposition Plan & Communication  Patient status: Inpatient  Admitted From: Home Planned disposition location: Skilled nursing facility Anticipated discharge date: 10/28 pending acceptance to SNF  Family Communication: wife and daughter in law at bedside    Author: Marien LITTIE Piety, DO [facility] 12/27/2023, 7:17 AM   Available by Epic secure chat 7AM-7PM. If 7PM-7AM, please contact night-coverage.  [facility] contact information found on [email or web address].

## 2023-12-27 NOTE — Evaluation (Addendum)
 Physical Therapy Evaluation Patient Details Name: Eric Floyd MRN: [id number] DOB: 28-May-1945 Today's Date: 12/27/2023  History of Present Illness  Eric Floyd is a 78 y.o. male presents after a fall with Displaced femoral neck fracture Right hip; s/p Right hip hemi arthroplasty, anterior approach 10/26. PMH: Alzheimer's dementia, bradycardia and hypertension  Clinical Impression  Pt admitted with above diagnosis. Pt pleasantly confused, spouse and daughter in law in room providing history; pt ind with self care, amb in home without AD, amb arm in arm with spouse in the community limited distances, has 5 steps to enter the home and 4 steps inside the home to the next level. On eval, pt denies pain when asked but noted with have facial wincing and hesitation with RLE movement. Pt needing mod A to mobilize to bedside for RLE and trunk assistance. Pt powers to stand with min A, constant cues for hand placement as pt prefers to pull from RW. Pt amb 20 ft with RW, min to mod A for steadying support, mild trendelenburg R hip frop noted in stance phase, generally unsteady without LOB. Pt remains up in recliner at end of session with spouse and daughter in law present. Patient will benefit from continued inpatient follow up therapy, <3 hours/day. Pt currently with functional limitations due to the deficits listed below (see PT Problem List). Pt will benefit from acute skilled PT to increase their independence and safety with mobility to allow discharge.           If plan is discharge home, recommend the following: A little help with walking and/or transfers;A little help with bathing/dressing/bathroom;Assistance with cooking/housework;Assist for transportation;Help with stairs or ramp for entrance;Direct supervision/assist for medications management   Can travel by private vehicle   Yes    Equipment Recommendations None recommended by PT (defer to next venue)  Recommendations for Other  Services       Functional Status Assessment Patient has had a recent decline in their functional status and demonstrates the ability to make significant improvements in function in a reasonable and predictable amount of time.     Precautions / Restrictions Precautions Precautions: Fall Restrictions Weight Bearing Restrictions Per Provider Order: No      Mobility  Bed Mobility Overal bed mobility: Needs Assistance Bed Mobility: Supine to Sit     Supine to sit: Mod assist, HOB elevated, Used rails     General bed mobility comments: pt cued to use bedrails to self assist trunk and sliding to bedside, mod A to mobilize RLE and complete transfer to bedside    Transfers Overall transfer level: Needs assistance Equipment used: Rolling walker (2 wheels) Transfers: Sit to/from Stand Sit to Stand: Min assist           General transfer comment: min A to power up from bedside adn recliner, slow to rise, cues for hand placement as pt prefers to pull from RW without success    Ambulation/Gait Ambulation/Gait assistance: Mod assist, Min assist Gait Distance (Feet): 20 Feet Assistive device: Rolling walker (2 wheels) Gait Pattern/deviations: Step-to pattern, Decreased stride length, Decreased weight shift to right, Decreased stance time - right, Narrow base of support Gait velocity: decreased     General Gait Details: step-to gait pattern with decreased RLE stance time and weightbearing, slight hip drop noted in stance phase on R leg, narrow BOS, cued for shorter steps and maintaining body within RW frame, generaly usnteady requiring mod-min A throughout gait  Stairs  Wheelchair Mobility     Tilt Bed    Modified Rankin (Stroke Patients Only)       Balance Overall balance assessment: Needs assistance, History of Falls Sitting-balance support: Feet supported Sitting balance-Leahy Scale: Fair Sitting balance - Comments: not challenged   Standing balance  support: Reliant on assistive device for balance, During functional activity, Bilateral upper extremity supported Standing balance-Leahy Scale: Poor                               Pertinent Vitals/Pain Pain Assessment Pain Assessment: Faces Faces Pain Scale: Hurts a little bit Pain Location: R hip Pain Descriptors / Indicators: Discomfort Pain Intervention(s): Limited activity within patient's tolerance, Monitored during session, Repositioned, Premedicated before session    Home Living Family/patient expects to be discharged to:: Private residence Living Arrangements: Spouse/significant other Available Help at Discharge: Family Type of Home: House Home Access: Stairs to enter Entrance Stairs-Rails: Doctor, General Practice of Steps: 5 Alternate Level Stairs-Number of Steps: 4 Home Layout: Multi-level Home Equipment: None      Prior Function Prior Level of Function : Independent/Modified Independent;Needs assist             Mobility Comments: spouse reports pt ind with in home amb, ambulates arm in arm with spouse when exiting the home but mostly a household ambulator ADLs Comments: spouse reports pt ind with bathing, toileting, dressing; spouse completes household chores     Extremity/Trunk Assessment   Upper Extremity Assessment Upper Extremity Assessment: Defer to OT evaluation    Lower Extremity Assessment Lower Extremity Assessment: RLE deficits/detail RLE Deficits / Details: ankle AROM WFL, knee able to perform quad set and heel slide ~20 deg, assist for hip abd/add in supine RLE Sensation: WNL RLE Coordination: WNL    Cervical / Trunk Assessment Cervical / Trunk Assessment: Kyphotic  Communication   Communication Communication: No apparent difficulties    Cognition Arousal: Alert Behavior During Therapy: WFL for tasks assessed/performed   PT - Cognitive impairments: History of cognitive impairments                                  Cueing       General Comments      Exercises     Assessment/Plan    PT Assessment Patient needs continued PT services  PT Problem List Decreased strength;Decreased range of motion;Decreased activity tolerance;Decreased balance;Decreased mobility;Decreased cognition;Decreased knowledge of use of DME;Decreased safety awareness;Decreased knowledge of precautions;Pain       PT Treatment Interventions DME instruction;Gait training;Stair training;Functional mobility training;Therapeutic activities;Therapeutic exercise;Balance training;Neuromuscular re-education;Patient/family education;Modalities    PT Goals (Current goals can be found in the Care Plan section)  Acute Rehab PT Goals Patient Stated Goal: SNF then home PT Goal Formulation: With patient/family Time For Goal Achievement: 01/10/24 Potential to Achieve Goals: Good    Frequency Min 3X/week     Co-evaluation               AM-PAC PT 6 Clicks Mobility  Outcome Measure Help needed turning from your back to your side while in a flat bed without using bedrails?: A Lot Help needed moving from lying on your back to sitting on the side of a flat bed without using bedrails?: A Lot Help needed moving to and from a bed to a chair (including a wheelchair)?: A Little Help needed standing up from a chair  using your arms (e.g., wheelchair or bedside chair)?: A Little Help needed to walk in hospital room?: A Lot Help needed climbing 3-5 steps with a railing? : Total 6 Click Score: 13    End of Session Equipment Utilized During Treatment: Gait belt Activity Tolerance: Patient tolerated treatment well Patient left: in chair;with call bell/phone within reach;with chair alarm set;with family/visitor present (spouse and daughter in social worker) Nurse Communication: Mobility status PT Visit Diagnosis: Unsteadiness on feet (R26.81);Other abnormalities of gait and mobility (R26.89);Muscle weakness (generalized)  (M62.81);Pain Pain - Right/Left: Right Pain - part of body: Hip    Time: 9048-8986 PT Time Calculation (min) (ACUTE ONLY): 22 min   Charges:   PT Evaluation $PT Eval Low Complexity: 1 Low   PT General Charges $$ ACUTE PT VISIT: 1 Visit         Tori Braelynn Lupton PT, DPT 12/27/23, 10:25 AM

## 2023-12-27 NOTE — Progress Notes (Signed)
   Subjective:  78 y.o. male now POD 1 from right hip cemented hemiarthroplasty. Patient reports feeling comfortable with improvement in his pain. Nursing staff report no issues overnight. No numbness or tingling to the extremity.  Objective:   VITALS:   Vitals:   12/26/23 2145 12/26/23 2157 12/27/23 0053 12/27/23 0604  BP: 133/75 133/66 117/60 (!) 152/64  Pulse: (!) 58 62 66 (!) 59  Resp: 20 18 16 18   Temp: 98.9 F (37.2 C) (!) 97 F (36.1 C) 98.8 F (37.1 C) 98.1 F (36.7 C)  TempSrc: Axillary Oral Oral Oral  SpO2: 99% 97% 96% 97%    Physical Exam: General: Alert, no acute distress Cardiovascular: No pedal edema Respiratory: No cyanosis, no use of accessory musculature GI: No organomegaly, abdomen is soft and non-tender Skin: No lesions in the area of chief complaint Neurologic: Sensation intact distally Psychiatric: Patient is confused and demented at baseline Lymphatic: No axillary or cervical lymphadenopathy  MUSCULOSKELETAL: Right lower extermity - Dressing clean/dry/intact - TTP peri incisionally - Leg lengths equal - Motor intact to right lower extremity - Sensation intact to light touch sural, saphenous, tibial, deep and superficial peroneal nerve distributions - 2+ DP pulse  Lab Results  Component Value Date   WBC 6.3 12/27/2023   HGB 10.4 (L) 12/27/2023   HCT 32.7 (L) 12/27/2023   MCV 95.3 12/27/2023   PLT 152 12/27/2023   BMET    Component Value Date/Time   NA 134 (L) 12/27/2023 0317   NA 140 12/20/2019 1159   K 4.7 12/27/2023 0317   CL 100 12/27/2023 0317   CO2 24 12/27/2023 0317   GLUCOSE 152 (H) 12/27/2023 0317   BUN 35 (H) 12/27/2023 0317   BUN 15 12/20/2019 1159   CREATININE 1.29 (H) 12/27/2023 0317   CALCIUM  8.8 (L) 12/27/2023 0317   GFRNONAA 57 (L) 12/27/2023 9682     Assessment/Plan: 78 y.o. male  now 1 Day Post-Op from Principal Problem:   Right femoral fracture (HCC) Active Problems:   History of right hip hemiarthroplasty .   Patient is doing well. He may get up with physical therapy today for ambulation. Plan for 6 weeks of aspirin 81 mg BID x 6 weeks for DVT ppx  Weight bearing: as tolerated right lower extremity Pain control: oxy PT/OT DVT ppx: aspirin 81 mg BID x 6 weeks Bowel regimen: docusate, senna Dispo: Pending PT eval   [facility] ORN Henessy Rohrer 12/27/2023, 7:41 AM

## 2023-12-28 DIAGNOSIS — S72001A Fracture of unspecified part of neck of right femur, initial encounter for closed fracture: Secondary | ICD-10-CM | POA: Diagnosis not present

## 2023-12-28 LAB — CBC
HCT: 30.4 % — ABNORMAL LOW (ref 39.0–52.0)
Hemoglobin: 9.6 g/dL — ABNORMAL LOW (ref 13.0–17.0)
MCH: 30 pg (ref 26.0–34.0)
MCHC: 31.6 g/dL (ref 30.0–36.0)
MCV: 95 fL (ref 80.0–100.0)
Platelets: 173 K/uL (ref 150–400)
RBC: 3.2 MIL/uL — ABNORMAL LOW (ref 4.22–5.81)
RDW: 13.1 % (ref 11.5–15.5)
WBC: 7.9 K/uL (ref 4.0–10.5)
nRBC: 0 % (ref 0.0–0.2)

## 2023-12-28 MED ORDER — MEMANTINE HCL 10 MG PO TABS
5.0000 mg | ORAL_TABLET | Freq: Two times a day (BID) | ORAL | Status: DC
  Administered 2023-12-28 – 2023-12-29 (×3): 5 mg via ORAL
  Filled 2023-12-28 (×3): qty 1

## 2023-12-28 NOTE — Plan of Care (Signed)

## 2023-12-28 NOTE — TOC Progression Note (Signed)
 Transition of Care [facility]) - Progression Note    Patient Details  Name: Eric Floyd MRN: [id number] Date of Birth: 1946/01/12  [facility] CM/SW Contact  NORMAN ASPEN, LCSW Phone Number: 12/28/2023, 2:43 PM  Clinical Narrative:     Have provided pt's wife with SNF bed offers and she will review with family and get back with me on choice.  Have started insurance auth with [employer].  Expected Discharge Plan: Skilled Nursing Facility Barriers to Discharge: Continued Medical Work up               Expected Discharge Plan and Services In-house Referral: NA Discharge Planning Services: NA Post Acute Care Choice: Skilled Nursing Facility Living arrangements for the past 2 months: Single Family Home                 DME Arranged: N/A DME Agency: NA       HH Arranged: NA HH Agency: NA         Social Drivers of Health (SDOH) Interventions SDOH Screenings   Food Insecurity: No Food Insecurity (12/26/2023)  Housing: Low Risk  (12/26/2023)  Transportation Needs: No Transportation Needs (12/26/2023)  Utilities: Not At Risk (12/26/2023)  Alcohol Screen: Low Risk  (08/18/2023)  Depression (PHQ2-9): Low Risk  (11/29/2023)  Financial Resource Strain: Low Risk  (08/18/2023)  Physical Activity: Inactive (08/18/2023)  Social Connections: Moderately Isolated (12/26/2023)  Stress: No Stress Concern Present (08/18/2023)  Tobacco Use: Medium Risk (12/26/2023)  Health Literacy: Inadequate Health Literacy (08/18/2023)    Readmission Risk Interventions     No data to display

## 2023-12-28 NOTE — Plan of Care (Signed)
  Problem: Education: Goal: Knowledge of General Education information will improve Description: Including pain rating scale, medication(s)/side effects and non-pharmacologic comfort measures Outcome: Progressing   Problem: Health Behavior/Discharge Planning: Goal: Ability to manage health-related needs will improve Outcome: Progressing   Problem: Clinical Measurements: Goal: Ability to maintain clinical measurements within normal limits will improve Outcome: Progressing Goal: Will remain free from infection Outcome: Progressing Goal: Diagnostic test results will improve Outcome: Adequate for Discharge Goal: Respiratory complications will improve Outcome: Progressing Goal: Cardiovascular complication will be avoided Outcome: Completed/Met   Problem: Activity: Goal: Risk for activity intolerance will decrease Outcome: Adequate for Discharge   Problem: Nutrition: Goal: Adequate nutrition will be maintained Outcome: Completed/Met   Problem: Coping: Goal: Level of anxiety will decrease Outcome: Progressing   Problem: Pain Managment: Goal: General experience of comfort will improve and/or be controlled Outcome: Adequate for Discharge   Problem: Safety: Goal: Ability to remain free from injury will improve Outcome: Progressing   Problem: Skin Integrity: Goal: Risk for impaired skin integrity will decrease Outcome: Adequate for Discharge   Problem: Education: Goal: Knowledge of the prescribed therapeutic regimen will improve Outcome: Progressing Goal: Understanding of discharge needs will improve Outcome: Progressing Goal: Individualized Educational Video(s) Outcome: Progressing   Problem: Activity: Goal: Ability to avoid complications of mobility impairment will improve Outcome: Progressing Goal: Ability to tolerate increased activity will improve Outcome: Adequate for Discharge   Problem: Clinical Measurements: Goal: Postoperative complications will be avoided  or minimized Outcome: Progressing   Problem: Pain Management: Goal: Pain level will decrease with appropriate interventions Outcome: Progressing

## 2023-12-28 NOTE — Progress Notes (Signed)
   Subjective:  78 y.o. male now POD 2 from right hip hemiarthroplasty. Patient reports he has little pain  Otherwise, no acute events overnight. No numbness or tingling to the extremity.  Objective:   VITALS:   Vitals:   12/27/23 2024 12/28/23 0637 12/28/23 1351 12/28/23 1520  BP: (!) 136/48 (!) 153/64 130/70   Pulse: (!) 58 (!) 51 84   Resp: 15 15 14    Temp: 98 F (36.7 C) 98 F (36.7 C) 98.2 F (36.8 C)   TempSrc: Oral Oral Oral   SpO2: 99% 97% (!) 68% 100%  Weight:      Height:        Physical Exam: General: Alert, no acute distress Cardiovascular: No pedal edema Respiratory: No cyanosis, no use of accessory musculature GI: No organomegaly, abdomen is soft and non-tender Skin: No lesions in the area of chief complaint Neurologic: Sensation intact distally Psychiatric: Patient is competent for consent with normal mood and affect Lymphatic: No axillary or cervical lymphadenopathy  MUSCULOSKELETAL: right lower extremity - Dressing c/d/i - TTP peri incisionally - Motor intact to lower extremity - Leg lengths equal - Sensation intact to light touch sural, saphenous, tibial, superficial and deep peroneal nerve distributions - 2+ DP pulse  Lab Results  Component Value Date   WBC 7.9 12/28/2023   HGB 9.6 (L) 12/28/2023   HCT 30.4 (L) 12/28/2023   MCV 95.0 12/28/2023   PLT 173 12/28/2023   BMET    Component Value Date/Time   NA 134 (L) 12/27/2023 0317   NA 140 12/20/2019 1159   K 4.7 12/27/2023 0317   CL 100 12/27/2023 0317   CO2 24 12/27/2023 0317   GLUCOSE 152 (H) 12/27/2023 0317   BUN 35 (H) 12/27/2023 0317   BUN 15 12/20/2019 1159   CREATININE 1.29 (H) 12/27/2023 0317   CALCIUM  8.8 (L) 12/27/2023 0317   GFRNONAA 57 (L) 12/27/2023 9682     Assessment/Plan: 78 y.o. male  now 2 Days Post-Op from Principal Problem:   Right femoral fracture (HCC) Active Problems:   History of right hip hemiarthroplasty .  Patient is doing well. He will require  continued PT for strengthening and gait training  Weight bearing: as tolerated right lower extremity Pain control: oxy PT/OT DVT ppx: aspirin 81 mg BID Bowel regimen: docusate  Dispo: pending [facility] at [facility]   Rankin ORN Ambyr Qadri 12/28/2023, 5:04 PM

## 2023-12-28 NOTE — Progress Notes (Addendum)
 PROGRESS NOTE Eric Floyd    DOB: November 17, 1945, 78 y.o.  FMW:[id number]    Code Status: Full Code   DOA: 12/26/2023   LOS: 2  Brief hospital course  Eric Floyd is a 78 y.o. male with a PMH significant for Alzheimer's dementia, bradycardia and hypertension brought to ED due to right hip pain after he sustained a fall. independently ambulates at baseline.   ED course: HR in upper 50s.  Other vital stable. Cr 1.42.  BUN 33.  CK 1400.  CT head and cervical spine without acute finding.   Hip x-ray showed acute transcervical fracture of right femoral neck with varus impaction.  Received 1 L fluid bolus for AKI and mild rhabdo. Ortho surgery was consulted and performed ORIF 10/26. Tolerated well.   12/28/23 -pending bed offer for SNF   Assessment & Plan  Principal Problem:   Right femoral fracture (HCC) Active Problems:   History of right hip hemiarthroplasty  Unwitnessed fall at home Acute right femoral neck fracture with varus infarction S/p ORIF 10/26 -Pt/ot evel- recommending SNF, pending bed offer  - Fall precaution   AKI: Creatinine 1.42 (was 0.93 in 05/2023).  Improved to 1.29 with IVF - d/c IVF and f/u am chemistry.  - ACEi on hold    Essential hypertension:  - Hold lisinopril  in the setting of AKI, bp appropriate    Sinus bradycardia: Chronic.  HR in upper 50s.  Not on nodal blocking agents. - Avoid nodal blocking agent   Moderate Alzheimer's dementia without behavioral disturbance - Reorientation and delirium precaution -  home memantine  resumed   Acute blood loss anemia  - Hgb 13.8>9.6  s/p ORIF.  - f/u am CBC   Body mass index is 25.06 kg/m.  VTE ppx: SCDs Start: 12/26/23 2157 Place TED hose Start: 12/26/23 2157 SCDs Start: 12/26/23 1211  Diet:     Diet   Diet regular Room service appropriate? Yes; Fluid consistency: Thin   Consultants: Ortho surgery   Subjective 12/28/23    Pt without complaints, eating lunch at the time of my evaluation.  Wife at bedside. All questions answered.    Objective  Blood pressure (!) 152/64, pulse (!) 59, temperature 98.1 F (36.7 C), temperature source Oral, resp. rate 18, SpO2 97%.  Intake/Output Summary (Last 24 hours) at 12/28/2023 1230 Last data filed at 12/28/2023 1004 Gross per 24 hour  Intake 60 ml  Output --  Net 60 ml   Filed Weights   12/27/23 1623  Weight: 66.2 kg     Physical Exam:  General: awake, alert, NAD HEENT: atraumatic, clear conjunctiva, anicteric sclera, MMM  Respiratory: normal respiratory effort. Nervous: A&O x3. no gross focal neurologic deficits, normal speech Extremities: moves all equally, no edema, normal tone. Surgical incision clean without drainage Skin: dry, intact, normal temperature, normal color. No rashes, lesions or ulcers on exposed skin Psychiatry: normal mood, congruent affect  Labs   I have personally reviewed the following labs and imaging studies CBC    Component Value Date/Time   WBC 7.9 12/28/2023 0342   RBC 3.20 (L) 12/28/2023 0342   HGB 9.6 (L) 12/28/2023 0342   HGB 15.7 12/20/2019 1159   HCT 30.4 (L) 12/28/2023 0342   HCT 46.1 12/20/2019 1159   PLT 173 12/28/2023 0342   PLT 240 12/20/2019 1159   MCV 95.0 12/28/2023 0342   MCV 95 12/20/2019 1159   MCH 30.0 12/28/2023 0342   MCHC 31.6 12/28/2023 0342   RDW 13.1 12/28/2023  0342   RDW 13.1 12/20/2019 1159   LYMPHSABS 1.2 05/04/2023 1127   LYMPHSABS 1.2 12/20/2019 1159   MONOABS 0.4 05/04/2023 1127   EOSABS 0.1 05/04/2023 1127   EOSABS 0.2 12/20/2019 1159   BASOSABS 0.0 05/04/2023 1127   BASOSABS 0.0 12/20/2019 1159      Latest Ref Rng & Units 12/27/2023    3:17 AM 12/26/2023   10:36 AM 05/04/2023   11:27 AM  BMP  Glucose 70 - 99 mg/dL 847  875  89   BUN 8 - 23 mg/dL 35  32  21   Creatinine 0.61 - 1.24 mg/dL 8.70  8.57  9.06   Sodium 135 - 145 mmol/L 134  133  138   Potassium 3.5 - 5.1 mmol/L 4.7  4.9  4.1   Chloride 98 - 111 mmol/L 100  95  100   CO2 22 - 32 mmol/L  24  27  30    Calcium  8.9 - 10.3 mg/dL 8.8  89.8  9.4     DG Pelvis Portable Result Date: 12/26/2023 EXAM: 1 Or 2 View(S) Xray Of The Pelvis 12/26/2023 09:26:00 Pm COMPARISON: None Available. CLINICAL HISTORY: S/P Total Hip Arthroplasty [id number]. S/P Total Right Hip Arthroplasty FINDINGS: BONES AND JOINTS: Right Hip Bipolar Hemiarthroplasty In Expected Position. No Unexpected Fracture Or Dislocation. SOFT TISSUES: Expected Postoperative Changes Including Subcutaneous Air. Vascular Calcifications. IMPRESSION: 1. Right hip bipolar hemiarthroplasty in expected position. No unexpected fracture or dislocation. Electronically signed by: Dorethia Molt MD 12/26/2023 09:54 PM EDT RP Workstation: [id number]   DG HIP UNILAT WITH PELVIS 1V RIGHT Result Date: 12/26/2023 EXAM: 2 VIEW(S) XRAY OF THE RIGHT HIP 12/26/2023 08:29:13 PM COMPARISON: None available. CLINICAL HISTORY: [id number] Surgery, elective [id number]. Right bipolar anterior hip replacement; Fluoro time and dose- 12 seconds; 1.26 mGy FINDINGS: BONES AND JOINTS: There is a new right hip arthroplasty in anatomic alignment. SOFT TISSUES: The soft tissues are unremarkable. IMPRESSION: 1. New right hip arthroplasty in anatomic alignment. Electronically signed by: Greig Pique MD 12/26/2023 08:31 PM EDT RP Workstation: [id number]   DG C-Arm 1-60 Min-No Report Result Date: 12/26/2023 Fluoroscopy was utilized by the requesting physician.  No radiographic interpretation.   DG C-Arm 1-60 Min-No Report Result Date: 12/26/2023 Fluoroscopy was utilized by the requesting physician.  No radiographic interpretation.   DG C-Arm 1-60 Min-No Report Result Date: 12/26/2023 Fluoroscopy was utilized by the requesting physician.  No radiographic interpretation.   CT Hip Right Wo Contrast Result Date: 12/26/2023 CLINICAL DATA:  Hip pain after fall, femoral neck fracture on radiograph. EXAM: CT OF THE RIGHT HIP WITHOUT CONTRAST TECHNIQUE: Multidetector CT imaging of the right  hip was performed according to the standard protocol. Multiplanar CT image reconstructions were also generated. RADIATION DOSE REDUCTION: This exam was performed according to the departmental dose-optimization program which includes automated exposure control, adjustment of the mA and/or kV according to patient size and/or use of iterative reconstruction technique. COMPARISON:  Radiograph earlier today FINDINGS: Bones/Joint/Cartilage Comminuted and displaced transcervical femoral neck fracture. Mild apex anterior angulation and proximal migration of the femoral shaft. Femoral head remains seated in the acetabulum. Mild right hip osteoarthritis. Bone island in the right acetabulum. The pubic rami are intact. Ligaments Suboptimally assessed by CT. Muscles and Tendons Mild edema in the periarticular musculature related to fracture. Soft tissues Soft tissue edema laterally. Vascular calcifications. Enlarged prostate gland. The urinary bladder is distended. IMPRESSION: Comminuted and displaced femoral neck fracture. Electronically Signed   By: Andrea Gasman M.D.   On:  12/26/2023 13:28    Disposition Plan & Communication  Patient status: Inpatient  Admitted From: Home Planned disposition location: Skilled nursing facility Anticipated discharge date: 10/28 pending acceptance to SNF  Family Communication: wife at bedside    Author: Daved JAYSON Pump, DO [facility] 12/28/2023, 12:30 PM   Available by Epic secure chat 7AM-7PM. If 7PM-7AM, please contact night-coverage.  [facility] contact information found on [email or web address].

## 2023-12-29 DIAGNOSIS — N179 Acute kidney failure, unspecified: Secondary | ICD-10-CM | POA: Diagnosis not present

## 2023-12-29 DIAGNOSIS — S7291XA Unspecified fracture of right femur, initial encounter for closed fracture: Secondary | ICD-10-CM | POA: Diagnosis not present

## 2023-12-29 DIAGNOSIS — D62 Acute posthemorrhagic anemia: Secondary | ICD-10-CM | POA: Diagnosis not present

## 2023-12-29 DIAGNOSIS — E871 Hypo-osmolality and hyponatremia: Secondary | ICD-10-CM | POA: Diagnosis not present

## 2023-12-29 DIAGNOSIS — Z96641 Presence of right artificial hip joint: Secondary | ICD-10-CM | POA: Diagnosis not present

## 2023-12-29 DIAGNOSIS — S7291XD Unspecified fracture of right femur, subsequent encounter for closed fracture with routine healing: Secondary | ICD-10-CM | POA: Diagnosis not present

## 2023-12-29 DIAGNOSIS — M6281 Muscle weakness (generalized): Secondary | ICD-10-CM | POA: Diagnosis not present

## 2023-12-29 DIAGNOSIS — E559 Vitamin D deficiency, unspecified: Secondary | ICD-10-CM | POA: Diagnosis not present

## 2023-12-29 DIAGNOSIS — G309 Alzheimer's disease, unspecified: Secondary | ICD-10-CM | POA: Diagnosis not present

## 2023-12-29 DIAGNOSIS — L03314 Cellulitis of groin: Secondary | ICD-10-CM | POA: Diagnosis not present

## 2023-12-29 DIAGNOSIS — Z7401 Bed confinement status: Secondary | ICD-10-CM | POA: Diagnosis not present

## 2023-12-29 DIAGNOSIS — R001 Bradycardia, unspecified: Secondary | ICD-10-CM | POA: Diagnosis not present

## 2023-12-29 DIAGNOSIS — S72051D Unspecified fracture of head of right femur, subsequent encounter for closed fracture with routine healing: Secondary | ICD-10-CM | POA: Diagnosis not present

## 2023-12-29 DIAGNOSIS — S72001A Fracture of unspecified part of neck of right femur, initial encounter for closed fracture: Secondary | ICD-10-CM | POA: Diagnosis not present

## 2023-12-29 DIAGNOSIS — E785 Hyperlipidemia, unspecified: Secondary | ICD-10-CM | POA: Diagnosis not present

## 2023-12-29 DIAGNOSIS — K5790 Diverticulosis of intestine, part unspecified, without perforation or abscess without bleeding: Secondary | ICD-10-CM | POA: Diagnosis not present

## 2023-12-29 DIAGNOSIS — R531 Weakness: Secondary | ICD-10-CM | POA: Diagnosis not present

## 2023-12-29 DIAGNOSIS — S72001D Fracture of unspecified part of neck of right femur, subsequent encounter for closed fracture with routine healing: Secondary | ICD-10-CM | POA: Diagnosis not present

## 2023-12-29 DIAGNOSIS — I10 Essential (primary) hypertension: Secondary | ICD-10-CM | POA: Diagnosis not present

## 2023-12-29 DIAGNOSIS — R2689 Other abnormalities of gait and mobility: Secondary | ICD-10-CM | POA: Diagnosis not present

## 2023-12-29 DIAGNOSIS — R600 Localized edema: Secondary | ICD-10-CM | POA: Diagnosis not present

## 2023-12-29 DIAGNOSIS — G301 Alzheimer's disease with late onset: Secondary | ICD-10-CM | POA: Diagnosis not present

## 2023-12-29 DIAGNOSIS — R2681 Unsteadiness on feet: Secondary | ICD-10-CM | POA: Diagnosis not present

## 2023-12-29 DIAGNOSIS — G300 Alzheimer's disease with early onset: Secondary | ICD-10-CM | POA: Diagnosis not present

## 2023-12-29 DIAGNOSIS — F0390 Unspecified dementia without behavioral disturbance: Secondary | ICD-10-CM | POA: Diagnosis not present

## 2023-12-29 LAB — CBC
HCT: 32.5 % — ABNORMAL LOW (ref 39.0–52.0)
Hemoglobin: 10.6 g/dL — ABNORMAL LOW (ref 13.0–17.0)
MCH: 31.2 pg (ref 26.0–34.0)
MCHC: 32.6 g/dL (ref 30.0–36.0)
MCV: 95.6 fL (ref 80.0–100.0)
Platelets: 208 K/uL (ref 150–400)
RBC: 3.4 MIL/uL — ABNORMAL LOW (ref 4.22–5.81)
RDW: 13.2 % (ref 11.5–15.5)
WBC: 6.7 K/uL (ref 4.0–10.5)
nRBC: 0 % (ref 0.0–0.2)

## 2023-12-29 LAB — BASIC METABOLIC PANEL WITH GFR
Anion gap: 7 (ref 5–15)
BUN: 29 mg/dL — ABNORMAL HIGH (ref 8–23)
CO2: 28 mmol/L (ref 22–32)
Calcium: 9.2 mg/dL (ref 8.9–10.3)
Chloride: 99 mmol/L (ref 98–111)
Creatinine, Ser: 0.83 mg/dL (ref 0.61–1.24)
GFR, Estimated: >60 mL/min (ref >60)
Glucose, Bld: 96 mg/dL (ref 70–99)
Potassium: 4.4 mmol/L (ref 3.5–5.1)
Sodium: 134 mmol/L — ABNORMAL LOW (ref 135–145)

## 2023-12-29 MED ORDER — ASPIRIN 81 MG PO CHEW: 81.0000 mg | CHEWABLE_TABLET | Freq: Two times a day (BID) | ORAL | 0 refills | Status: AC

## 2023-12-29 MED ORDER — ACETAMINOPHEN 500 MG PO TABS: 1000.0000 mg | ORAL_TABLET | Freq: Three times a day (TID) | ORAL | Status: AC

## 2023-12-29 MED ORDER — IPRATROPIUM BROMIDE 0.03 % NA SOLN: 1.0000 | Freq: Two times a day (BID) | NASAL | Status: AC | PRN

## 2023-12-29 MED ORDER — ASPIRIN 81 MG PO CHEW: 81.0000 mg | CHEWABLE_TABLET | Freq: Two times a day (BID) | ORAL | 0 refills | Status: DC

## 2023-12-29 MED ORDER — POLYETHYLENE GLYCOL 3350 17 G PO PACK: 17.0000 g | PACK | Freq: Every day | ORAL | 0 refills | Status: DC | PRN

## 2023-12-29 MED ORDER — LISINOPRIL 10 MG PO TABS
10.0000 mg | ORAL_TABLET | Freq: Every day | ORAL | Status: DC
  Administered 2023-12-29: 10 mg via ORAL
  Filled 2023-12-29: qty 1

## 2023-12-29 MED ORDER — OXYCODONE HCL 5 MG PO TABS: 5.0000 mg | ORAL_TABLET | ORAL | 0 refills | Status: DC | PRN

## 2023-12-29 MED ORDER — ROSUVASTATIN CALCIUM 10 MG PO TABS: 10.0000 mg | ORAL_TABLET | Freq: Every day | ORAL | Status: DC

## 2023-12-29 MED ORDER — SENNA 8.6 MG PO TABS: 2.0000 | ORAL_TABLET | Freq: Every day | ORAL | 0 refills | Status: DC

## 2023-12-29 NOTE — TOC Transition Note (Signed)
 Transition of Care [facility]) - Discharge Note   Patient Details  Name: Eric Floyd MRN: [id number] Date of Birth: [birth year]  Transition of Care [facility]) CM/SW Contact:  NORMAN ASPEN, LCSW Phone Number: 12/29/2023, 1:21 PM   Clinical Narrative:     Pt/ spouse have chosen SNF bed at [facility] and have received insurance auth [id number]).  Facility ready to admit today and pt medically cleared for dc.  Pt/family aware and agreeable.  PTAR called at 1:20pm.  RN to call report to [phone].  No further IP CM needs.  Final next level of care: Skilled Nursing Facility Barriers to Discharge: Barriers Resolved   Patient Goals and CMS Choice Patient states their goals for this hospitalization and ongoing recovery are:: return home CMS Medicare.gov Compare Post Acute Care list provided to:: Patient Choice offered to / list presented to : Adult Children [place] ownership interest in [facility].provided to:: Adult Children    Discharge Placement PASRR number recieved: 12/27/23            Patient chooses bed at: [facility] Patient to be transferred to facility by: PTAR Name of family member notified: spouse Patient and family notified of of transfer: 12/29/23  Discharge Plan and Services Additional resources added to the After Visit Summary for   In-house Referral: NA Discharge Planning Services: NA Post Acute Care Choice: Skilled Nursing Facility          DME Arranged: N/A DME Agency: NA       HH Arranged: NA HH Agency: NA        Social Drivers of Health (SDOH) Interventions SDOH Screenings   Food Insecurity: No Food Insecurity (12/26/2023)  Housing: Low Risk  (12/26/2023)  Transportation Needs: No Transportation Needs (12/26/2023)  Utilities: Not At Risk (12/26/2023)  Alcohol Screen: Low Risk  (08/18/2023)  Depression (PHQ2-9): Low Risk  (11/29/2023)  Financial Resource Strain: Low Risk  (08/18/2023)  Physical Activity: Inactive (08/18/2023)  Social  Connections: Moderately Isolated (12/26/2023)  Stress: No Stress Concern Present (08/18/2023)  Tobacco Use: Medium Risk (12/26/2023)  Health Literacy: Inadequate Health Literacy (08/18/2023)     Readmission Risk Interventions    12/29/2023    1:20 PM  Readmission Risk Prevention Plan  Post Dischage Appt Complete  Medication Screening Complete  Transportation Screening Complete

## 2023-12-29 NOTE — Progress Notes (Signed)
 PROGRESS NOTE    Eric Floyd  FMW:[id number] DOB: [birth year] DOA: 12/26/2023 PCP: Purcell Emil Schanz, MD   Brief Narrative:  78 year old male with history of Alzheimer's dementia, bradycardia and hypertension presented with right hip pain after a fall and was found to have right femoral neck fracture.  He underwent ORIF on 12/26/2023.  PT recommended SNF placement.  TOC following.  Currently medically stable for discharge to SNF.  Assessment & Plan:   Acute right femoral neck fracture after a mechanical fall -underwent ORIF on 12/26/2023.  PT recommended SNF placement.  TOC following.  Currently medically stable for discharge to SNF. - Activity/wound care/DVT prophylaxis/pain management as per orthopedics recommendations.  AKI - Creatinine 1.42 on admission.  Treated with IV fluids creatinine has improved.  DC IV fluids.  Encourage oral intake  Hyponatremia -Mild.  Monitor intermittently  Essential hypertension--monitor blood pressure.  Lisinopril  on hold  Sinus bradycardia -Chronic.  Heart rate is currently stable.  Not on nodal blocking agents.  Avoid nodal blocking agents.  Moderate Alzheimer's dementia without behavioral disturbance - Fall precautions.  Delirium precautions.  Continue memantine   Acute blood loss anemia - Possibly perioperative.  Hemoglobin stable.  Monitor intermittently   DVT prophylaxis: Aspirin as per orthopedics recommendations Code Status: Full Family Communication: Wife at bedside Disposition Plan: Status is: Inpatient Remains inpatient appropriate because: Of severity of illness.  Need for SNF placement  Consultants: Orthopedics  Procedures: As above  Antimicrobials: Perioperative   Subjective: Patient seen and examined at bedside.  Wakes up only very slightly, slow to respond.  No fever, vomiting, agitation reported  Objective: Vitals:   12/28/23 1351 12/28/23 1520 12/28/23 2207 12/29/23 0620  BP: 130/70  (!) 162/68 (!)  170/80  Pulse: 84  61 (!) 49  Resp: 14  16 15   Temp: 98.2 F (36.8 C)  98.3 F (36.8 C) 98 F (36.7 C)  TempSrc: Oral     SpO2: (!) 68% 100% 97% 100%  Weight:      Height:        Intake/Output Summary (Last 24 hours) at 12/29/2023 1039 Last data filed at 12/28/2023 1500 Gross per 24 hour  Intake 150 ml  Output --  Net 150 ml   Filed Weights   12/27/23 1623  Weight: 66.2 kg    Examination:  General exam: Appears calm and comfortable.  Sleepy, wakes up slightly, extremely slow to respond, poor historian. Respiratory system: Bilateral decreased breath sounds at bases Cardiovascular system: S1 & S2 heard, Rate controlled Gastrointestinal system: Abdomen is nondistended, soft and nontender. Normal bowel sounds heard. Extremities: No cyanosis, clubbing, edema   Data Reviewed: I have personally reviewed following labs and imaging studies  CBC: Recent Labs  Lab 12/26/23 1036 12/27/23 0317 12/28/23 0342 12/29/23 0334  WBC 5.4 6.3 7.9 6.7  HGB 13.8 10.4* 9.6* 10.6*  HCT 42.7 32.7* 30.4* 32.5*  MCV 94.1 95.3 95.0 95.6  PLT 204 152 173 208   Basic Metabolic Panel: Recent Labs  Lab 12/26/23 1036 12/27/23 0317 12/29/23 0334  NA 133* 134* 134*  K 4.9 4.7 4.4  CL 95* 100 99  CO2 27 24 28   GLUCOSE 124* 152* 96  BUN 32* 35* 29*  CREATININE 1.42* 1.29* 0.83  CALCIUM  10.1 8.8* 9.2   GFR: Estimated Creatinine Clearance: 62.4 mL/min (by C-G formula based on SCr of 0.83 mg/dL). Liver Function Tests: Recent Labs  Lab 12/27/23 0317  AST 55*  ALT 20  ALKPHOS 53  BILITOT 0.4  PROT 6.7  ALBUMIN 3.7   No results for input(s): LIPASE, AMYLASE in the last 168 hours. No results for input(s): AMMONIA in the last 168 hours. Coagulation Profile: No results for input(s): INR, PROTIME in the last 168 hours. Cardiac Enzymes: Recent Labs  Lab 12/26/23 1036 12/27/23 0317  CKTOTAL 1,393* 1,171*   BNP (last 3 results) No results for input(s): PROBNP in the  last 8760 hours. HbA1C: No results for input(s): HGBA1C in the last 72 hours. CBG: No results for input(s): GLUCAP in the last 168 hours. Lipid Profile: No results for input(s): CHOL, HDL, LDLCALC, TRIG, CHOLHDL, LDLDIRECT in the last 72 hours. Thyroid  Function Tests: Recent Labs    12/26/23 1346  TSH 1.160   Anemia Panel: No results for input(s): VITAMINB12, FOLATE, FERRITIN, TIBC, IRON, RETICCTPCT in the last 72 hours. Sepsis Labs: No results for input(s): PROCALCITON, LATICACIDVEN in the last 168 hours.  Recent Results (from the past 240 hours)  Surgical PCR screen     Status: None   Collection Time: 12/26/23  3:25 PM   Specimen: Nasal Mucosa; Nasal Swab  Result Value Ref Range Status   MRSA, PCR NEGATIVE NEGATIVE Final   Staphylococcus aureus NEGATIVE NEGATIVE Final    Comment: (NOTE) The Xpert SA Assay (FDA approved for NASAL specimens in patients 4 years of age and older), is one component of a comprehensive surveillance program. It is not intended to diagnose infection nor to guide or monitor treatment. Performed at [facility], [address], [place], [place] [address]          Radiology Studies: No results found.      Scheduled Meds:  acetaminophen  1,000 mg Oral Q8H   aspirin  81 mg Oral BID   memantine   5 mg Oral BID   polyethylene glycol  17 g Oral BID   senna  2 tablet Oral QHS   Continuous Infusions:  sodium chloride  75 mL/hr at 12/26/23 2249          Sophie Mao, MD [facility] 12/29/2023, 10:39 AM

## 2023-12-29 NOTE — Progress Notes (Signed)
 Attempted to call report to [facility] at [phone] and 1400, no answer and no voicemail available to leave message.

## 2023-12-29 NOTE — Discharge Summary (Addendum)
 Physician Discharge Summary  Eric Floyd FMW:[id number] DOB: 04/01/1945 DOA: 12/26/2023  PCP: Purcell Emil Schanz, MD  Admit date: 12/26/2023 Discharge date: 12/29/2023  Admitted From: Home Disposition: SNF  Recommendations for Outpatient Follow-up:  Follow up with SNF provider at earliest convenience Outpatient follow-up with orthopedics.  Activity/wound care/pain management/DVT prophylaxis as per orthopedics recommendations Follow up in ED if symptoms worsen or new appear   Home Health: No Equipment/Devices: None  Discharge Condition: Stable CODE STATUS: Full Diet recommendation: Heart healthy  Brief/Interim Summary: 78 year old male with history of Alzheimer's dementia, bradycardia and hypertension presented with right hip pain after a fall and was found to have right femoral neck fracture. He underwent ORIF on 12/26/2023. PT recommended SNF placement. TOC following. Currently medically stable for discharge to SNF.  Discharge to SNF once bed is available.  Discharge Diagnoses:   Acute right femoral neck fracture after a mechanical fall -underwent ORIF on 12/26/2023.  PT recommended SNF placement.  TOC following.  Currently medically stable for discharge to SNF. - Activity/wound care/DVT prophylaxis/pain management as per orthopedics recommendations. -Discharge to SNF once bed is available.   AKI - Creatinine 1.42 on admission.  Treated with IV fluids creatinine has improved.  DC IV fluids.  Encourage oral intake   Hyponatremia -Mild.  Monitor intermittently   Essential hypertension--monitor blood pressure.  Resume lisinopril  on discharge  Sinus bradycardia -Chronic.  Heart rate is currently stable.  Not on nodal blocking agents.  Avoid nodal blocking agents.   Moderate Alzheimer's dementia without behavioral disturbance - Fall precautions.  Delirium precautions.  Continue memantine    Acute blood loss anemia - Possibly perioperative.  Hemoglobin stable.   Monitor intermittently  Hyperlipidemia - rosuvastatin  remains on hold for now because of slightly elevated CK total on admission.  Outpatient follow-up with PCP regarding the same  Discharge Instructions  Discharge Instructions     Diet - low sodium heart healthy   Complete by: As directed    Discharge wound care:   Complete by: As directed    As per orthopedics recommendations   Increase activity slowly   Complete by: As directed       Allergies as of 12/29/2023   No Known Allergies      Medication List     STOP taking these medications    rosuvastatin  10 MG tablet Commonly known as: CRESTOR        TAKE these medications    acetaminophen 500 MG tablet Commonly known as: TYLENOL Take 2 tablets (1,000 mg total) by mouth every 8 (eight) hours.   aspirin 81 MG chewable tablet Chew 1 tablet (81 mg total) by mouth 2 (two) times daily.   CALCIUM  PO Take 1 tablet by mouth daily.   hydrOXYzine 25 MG tablet Commonly known as: ATARAX Take 1 tablet (25 mg total) by mouth at bedtime as needed.   ipratropium 0.03 % nasal spray Commonly known as: ATROVENT  Place 1-2 sprays into both nostrils 2 (two) times daily as needed for rhinitis (allergies).   lisinopril  10 MG tablet Commonly known as: ZESTRIL  Take 1 tablet by mouth once daily   memantine  5 MG tablet Commonly known as: NAMENDA  Take 1 tablet (5 mg total) by mouth 2 (two) times daily.   oxyCODONE 5 MG immediate release tablet Commonly known as: Roxicodone Take 1 tablet (5 mg total) by mouth every 4 (four) hours as needed for severe pain (pain score 7-10).   polyethylene glycol 17 g packet Commonly known as: MIRALAX / GLYCOLAX Take  17 g by mouth daily as needed.   senna 8.6 MG Tabs tablet Commonly known as: SENOKOT Take 2 tablets (17.2 mg total) by mouth at bedtime.   VITAMIN B-12 PO Take 1 tablet by mouth daily.   VITAMIN C PO Take 1 tablet by mouth daily.   VITAMIN D -3 PO Take 1 capsule by mouth  daily.               Discharge Care Instructions  (From admission, onward)           Start     Ordered   12/29/23 0000  Discharge wound care:       Comments: As per orthopedics recommendations   12/29/23 1141                Follow-up Information     Teresa Rankin ORN, MD. Schedule an appointment as soon as possible for a visit in 2 week(s).   Specialty: Orthopedic Surgery Contact information: [address] [place] [address] [place] [address] [phone]                No Known Allergies  Consultations: Orthopedics   Procedures/Studies: DG Pelvis Portable Result Date: 12/26/2023 EXAM: 1 Or 2 View(S) Xray Of The Pelvis 12/26/2023 09:26:00 Pm COMPARISON: None Available. CLINICAL HISTORY: S/P Total Hip Arthroplasty [id number]. S/P Total Right Hip Arthroplasty FINDINGS: BONES AND JOINTS: Right Hip Bipolar Hemiarthroplasty In Expected Position. No Unexpected Fracture Or Dislocation. SOFT TISSUES: Expected Postoperative Changes Including Subcutaneous Air. Vascular Calcifications. IMPRESSION: 1. Right hip bipolar hemiarthroplasty in expected position. No unexpected fracture or dislocation. Electronically signed by: Dorethia Molt MD 12/26/2023 09:54 PM EDT RP Workstation: [id number]   DG HIP UNILAT WITH PELVIS 1V RIGHT Result Date: 12/26/2023 EXAM: 2 VIEW(S) XRAY OF THE RIGHT HIP 12/26/2023 08:29:13 PM COMPARISON: None available. CLINICAL HISTORY: [id number] Surgery, elective [id number]. Right bipolar anterior hip replacement; Fluoro time and dose- 12 seconds; 1.26 mGy FINDINGS: BONES AND JOINTS: There is a new right hip arthroplasty in anatomic alignment. SOFT TISSUES: The soft tissues are unremarkable. IMPRESSION: 1. New right hip arthroplasty in anatomic alignment. Electronically signed by: Greig Pique MD 12/26/2023 08:31 PM EDT RP Workstation: [id number]   DG C-Arm 1-60 Min-No Report Result Date: 12/26/2023 Fluoroscopy was utilized by the requesting physician.  No  radiographic interpretation.   DG C-Arm 1-60 Min-No Report Result Date: 12/26/2023 Fluoroscopy was utilized by the requesting physician.  No radiographic interpretation.   DG C-Arm 1-60 Min-No Report Result Date: 12/26/2023 Fluoroscopy was utilized by the requesting physician.  No radiographic interpretation.   CT Hip Right Wo Contrast Result Date: 12/26/2023 CLINICAL DATA:  Hip pain after fall, femoral neck fracture on radiograph. EXAM: CT OF THE RIGHT HIP WITHOUT CONTRAST TECHNIQUE: Multidetector CT imaging of the right hip was performed according to the standard protocol. Multiplanar CT image reconstructions were also generated. RADIATION DOSE REDUCTION: This exam was performed according to the departmental dose-optimization program which includes automated exposure control, adjustment of the mA and/or kV according to patient size and/or use of iterative reconstruction technique. COMPARISON:  Radiograph earlier today FINDINGS: Bones/Joint/Cartilage Comminuted and displaced transcervical femoral neck fracture. Mild apex anterior angulation and proximal migration of the femoral shaft. Femoral head remains seated in the acetabulum. Mild right hip osteoarthritis. Bone island in the right acetabulum. The pubic rami are intact. Ligaments Suboptimally assessed by CT. Muscles and Tendons Mild edema in the periarticular musculature related to fracture. Soft tissues Soft tissue edema laterally. Vascular calcifications. Enlarged prostate gland. The  urinary bladder is distended. IMPRESSION: Comminuted and displaced femoral neck fracture. Electronically Signed   By: Andrea Gasman M.D.   On: 12/26/2023 13:28   CT Cervical Spine Wo Contrast Result Date: 12/26/2023 EXAM: CT CERVICAL SPINE WITHOUT CONTRAST 12/26/2023 11:27:34 AM TECHNIQUE: CT of the cervical spine was performed without the administration of intravenous contrast. Multiplanar reformatted images are provided for review. Automated exposure control,  iterative reconstruction, and/or weight based adjustment of the mA/kV was utilized to reduce the radiation dose to as low as reasonably achievable. COMPARISON: Head CT reported separately today. CLINICAL HISTORY: 78 year old male with neck trauma after a fall this morning. Patient has dementia and right hip/upper leg pain. FINDINGS: CERVICAL SPINE: BONES AND ALIGNMENT: No acute fracture or traumatic malalignment. Degenerative reversal of the normal cervical lordosis. Mild dextroconvex cervical scoliosis. DEGENERATIVE CHANGES: Bulky anterior endplate osteophytes C4-C5 through C6-C7. Severe disc space loss at those levels. Some vacuum disc. Multilevel mild cervical spinal stenosis suspected. Degenerative ankylosis of the left C2-C3 facets. SOFT TISSUES: No prevertebral soft tissue swelling. Mild for age cervical carotid calcified atherosclerosis. Mild apical lung scarring at the thoracic inlet. IMPRESSION: 1. No acute traumatic injury identified in the cervical spine. 2. Advanced cervical degeneration with suspected multilevel mild cervical spinal stenosis. Electronically signed by: Helayne Hurst MD 12/26/2023 11:35 AM EDT RP Workstation: [id number]   CT Head Wo Contrast Result Date: 12/26/2023 EXAM: CT HEAD WITHOUT CONTRAST 12/26/2023 11:27:34 AM TECHNIQUE: CT of the head was performed without the administration of intravenous contrast. Automated exposure control, iterative reconstruction, and/or weight based adjustment of the mA/kV was utilized to reduce the radiation dose to as low as reasonably achievable. COMPARISON: Brain MRI 03/13/2020. CLINICAL HISTORY: 78 year old male. Minor head trauma after a fall at 1 AM. Patient has dementia. Complains of right hip/upper leg pain. FINDINGS: BRAIN AND VENTRICLES: No acute hemorrhage. No evidence of acute infarct. No hydrocephalus. No extra-axial collection. No mass effect or midline shift. Cerebral volume not significantly changed. Gray white differentiation is within  normal limits for age. No suspicious intracranial vascular hyperdensity. ORBITS: No discrete orbital soft tissue injury identified. SINUSES: Minor paranasal sinus mucosal thickening. SOFT TISSUES AND SKULL: No discrete scalp soft tissue injury identified. No skull fracture. Significant styloid phalanx. IMPRESSION: 1. No acute traumatic injury identified 2. Negative for age non-contrast head CT. Electronically signed by: Helayne Hurst MD 12/26/2023 11:32 AM EDT RP Workstation: [id number]   DG Hip Unilat W or Wo Pelvis 2-3 Views Right Result Date: 12/26/2023 EXAM: 2 or more VIEW(S) XRAY OF THE RIGHT HIP 12/26/2023 11:02:00 AM COMPARISON: None available. CLINICAL HISTORY: fall. Patient fell this morning around 1am. Patient has Dementia and was picked up by family. This morning patient complained of some pain in right hip and upper leg. Patient is moving his leg. FINDINGS: BONES AND JOINTS: Acute transcervical fracture of the right femoral neck with varus angulation. SOFT TISSUES: Vascular calcifications. IMPRESSION: 1. Acute transcervical fracture of the right femoral neck with varus impaction. Electronically signed by: Helayne Hurst MD 12/26/2023 11:21 AM EDT RP Workstation: [id number]      Subjective: Patient seen and examined at bedside. Wakes up only very slightly, slow to respond. No fever, vomiting, agitation reported   Discharge Exam: Vitals:   12/28/23 2207 12/29/23 0620  BP: (!) 162/68 (!) 170/80  Pulse: 61 (!) 49  Resp: 16 15  Temp: 98.3 F (36.8 C) 98 F (36.7 C)  SpO2: 97% 100%   General exam: Appears calm and comfortable.  Sleepy,  wakes up slightly, extremely slow to respond, poor historian. Respiratory system: Bilateral decreased breath sounds at bases Cardiovascular system: S1 & S2 heard, Rate controlled Gastrointestinal system: Abdomen is nondistended, soft and nontender. Normal bowel sounds heard. Extremities: No cyanosis, clubbing, edema     The results of significant  diagnostics from this hospitalization (including imaging, microbiology, ancillary and laboratory) are listed below for reference.     Microbiology: Recent Results (from the past 240 hours)  Surgical PCR screen     Status: None   Collection Time: 12/26/23  3:25 PM   Specimen: Nasal Mucosa; Nasal Swab  Result Value Ref Range Status   MRSA, PCR NEGATIVE NEGATIVE Final   Staphylococcus aureus NEGATIVE NEGATIVE Final    Comment: (NOTE) The Xpert SA Assay (FDA approved for NASAL specimens in patients 72 years of age and older), is one component of a comprehensive surveillance program. It is not intended to diagnose infection nor to guide or monitor treatment. Performed at [facility], [address], [place], [place] [address]      Labs: BNP (last 3 results) No results for input(s): BNP in the last 8760 hours. Basic Metabolic Panel: Recent Labs  Lab 12/26/23 1036 12/27/23 0317 12/29/23 0334  NA 133* 134* 134*  K 4.9 4.7 4.4  CL 95* 100 99  CO2 27 24 28   GLUCOSE 124* 152* 96  BUN 32* 35* 29*  CREATININE 1.42* 1.29* 0.83  CALCIUM  10.1 8.8* 9.2   Liver Function Tests: Recent Labs  Lab 12/27/23 0317  AST 55*  ALT 20  ALKPHOS 53  BILITOT 0.4  PROT 6.7  ALBUMIN 3.7   No results for input(s): LIPASE, AMYLASE in the last 168 hours. No results for input(s): AMMONIA in the last 168 hours. CBC: Recent Labs  Lab 12/26/23 1036 12/27/23 0317 12/28/23 0342 12/29/23 0334  WBC 5.4 6.3 7.9 6.7  HGB 13.8 10.4* 9.6* 10.6*  HCT 42.7 32.7* 30.4* 32.5*  MCV 94.1 95.3 95.0 95.6  PLT 204 152 173 208   Cardiac Enzymes: Recent Labs  Lab 12/26/23 1036 12/27/23 0317  CKTOTAL 1,393* 1,171*   BNP: Invalid input(s): POCBNP CBG: No results for input(s): GLUCAP in the last 168 hours. D-Dimer No results for input(s): DDIMER in the last 72 hours. Hgb A1c No results for input(s): HGBA1C in the last 72 hours. Lipid Profile No results for  input(s): CHOL, HDL, LDLCALC, TRIG, CHOLHDL, LDLDIRECT in the last 72 hours. Thyroid  function studies Recent Labs    12/26/23 1346  TSH 1.160   Anemia work up No results for input(s): VITAMINB12, FOLATE, FERRITIN, TIBC, IRON, RETICCTPCT in the last 72 hours. Urinalysis No results found for: COLORURINE, APPEARANCEUR, LABSPEC, PHURINE, GLUCOSEU, HGBUR, BILIRUBINUR, KETONESUR, PROTEINUR, UROBILINOGEN, NITRITE, LEUKOCYTESUR Sepsis Labs Recent Labs  Lab 12/26/23 1036 12/27/23 0317 12/28/23 0342 12/29/23 0334  WBC 5.4 6.3 7.9 6.7   Microbiology Recent Results (from the past 240 hours)  Surgical PCR screen     Status: None   Collection Time: 12/26/23  3:25 PM   Specimen: Nasal Mucosa; Nasal Swab  Result Value Ref Range Status   MRSA, PCR NEGATIVE NEGATIVE Final   Staphylococcus aureus NEGATIVE NEGATIVE Final    Comment: (NOTE) The Xpert SA Assay (FDA approved for NASAL specimens in patients 53 years of age and older), is one component of a comprehensive surveillance program. It is not intended to diagnose infection nor to guide or monitor treatment. Performed at [facility], [address] [place],  [place] [address]      Time coordinating discharge: 35 minutes  SIGNED:   Sophie Mao, MD  [facility] 12/29/2023, 11:41 AM

## 2023-12-30 DIAGNOSIS — N179 Acute kidney failure, unspecified: Secondary | ICD-10-CM | POA: Diagnosis not present

## 2023-12-30 DIAGNOSIS — I10 Essential (primary) hypertension: Secondary | ICD-10-CM | POA: Diagnosis not present

## 2023-12-30 DIAGNOSIS — G300 Alzheimer's disease with early onset: Secondary | ICD-10-CM | POA: Diagnosis not present

## 2023-12-30 DIAGNOSIS — E785 Hyperlipidemia, unspecified: Secondary | ICD-10-CM | POA: Diagnosis not present

## 2023-12-30 DIAGNOSIS — D62 Acute posthemorrhagic anemia: Secondary | ICD-10-CM | POA: Diagnosis not present

## 2023-12-30 DIAGNOSIS — S72051D Unspecified fracture of head of right femur, subsequent encounter for closed fracture with routine healing: Secondary | ICD-10-CM | POA: Diagnosis not present

## 2023-12-30 DIAGNOSIS — R001 Bradycardia, unspecified: Secondary | ICD-10-CM | POA: Diagnosis not present

## 2023-12-30 DIAGNOSIS — E871 Hypo-osmolality and hyponatremia: Secondary | ICD-10-CM | POA: Diagnosis not present

## 2024-01-02 ENCOUNTER — Other Ambulatory Visit: Payer: Self-pay | Admitting: Emergency Medicine

## 2024-01-02 DIAGNOSIS — E785 Hyperlipidemia, unspecified: Secondary | ICD-10-CM

## 2024-01-02 DIAGNOSIS — I10 Essential (primary) hypertension: Secondary | ICD-10-CM

## 2024-01-04 DIAGNOSIS — E785 Hyperlipidemia, unspecified: Secondary | ICD-10-CM | POA: Diagnosis not present

## 2024-01-04 DIAGNOSIS — G300 Alzheimer's disease with early onset: Secondary | ICD-10-CM | POA: Diagnosis not present

## 2024-01-04 DIAGNOSIS — E871 Hypo-osmolality and hyponatremia: Secondary | ICD-10-CM | POA: Diagnosis not present

## 2024-01-04 DIAGNOSIS — S72051D Unspecified fracture of head of right femur, subsequent encounter for closed fracture with routine healing: Secondary | ICD-10-CM | POA: Diagnosis not present

## 2024-01-04 DIAGNOSIS — I10 Essential (primary) hypertension: Secondary | ICD-10-CM | POA: Diagnosis not present

## 2024-01-04 DIAGNOSIS — R001 Bradycardia, unspecified: Secondary | ICD-10-CM | POA: Diagnosis not present

## 2024-01-06 DIAGNOSIS — R001 Bradycardia, unspecified: Secondary | ICD-10-CM | POA: Diagnosis not present

## 2024-01-06 DIAGNOSIS — I10 Essential (primary) hypertension: Secondary | ICD-10-CM | POA: Diagnosis not present

## 2024-01-06 DIAGNOSIS — G301 Alzheimer's disease with late onset: Secondary | ICD-10-CM | POA: Diagnosis not present

## 2024-01-06 DIAGNOSIS — D62 Acute posthemorrhagic anemia: Secondary | ICD-10-CM | POA: Diagnosis not present

## 2024-01-06 DIAGNOSIS — S72001A Fracture of unspecified part of neck of right femur, initial encounter for closed fracture: Secondary | ICD-10-CM | POA: Diagnosis not present

## 2024-01-06 DIAGNOSIS — E871 Hypo-osmolality and hyponatremia: Secondary | ICD-10-CM | POA: Diagnosis not present

## 2024-01-07 DIAGNOSIS — R600 Localized edema: Secondary | ICD-10-CM | POA: Diagnosis not present

## 2024-01-11 DIAGNOSIS — G300 Alzheimer's disease with early onset: Secondary | ICD-10-CM | POA: Diagnosis not present

## 2024-01-11 DIAGNOSIS — I10 Essential (primary) hypertension: Secondary | ICD-10-CM | POA: Diagnosis not present

## 2024-01-11 DIAGNOSIS — E785 Hyperlipidemia, unspecified: Secondary | ICD-10-CM | POA: Diagnosis not present

## 2024-01-11 DIAGNOSIS — S72051D Unspecified fracture of head of right femur, subsequent encounter for closed fracture with routine healing: Secondary | ICD-10-CM | POA: Diagnosis not present

## 2024-01-11 DIAGNOSIS — S7291XA Unspecified fracture of right femur, initial encounter for closed fracture: Secondary | ICD-10-CM | POA: Diagnosis not present

## 2024-01-13 DIAGNOSIS — L03314 Cellulitis of groin: Secondary | ICD-10-CM | POA: Diagnosis not present

## 2024-01-13 DIAGNOSIS — G301 Alzheimer's disease with late onset: Secondary | ICD-10-CM | POA: Diagnosis not present

## 2024-01-13 DIAGNOSIS — I10 Essential (primary) hypertension: Secondary | ICD-10-CM | POA: Diagnosis not present

## 2024-01-13 DIAGNOSIS — S72001D Fracture of unspecified part of neck of right femur, subsequent encounter for closed fracture with routine healing: Secondary | ICD-10-CM | POA: Diagnosis not present

## 2024-01-17 DIAGNOSIS — E559 Vitamin D deficiency, unspecified: Secondary | ICD-10-CM | POA: Diagnosis not present

## 2024-01-17 DIAGNOSIS — E785 Hyperlipidemia, unspecified: Secondary | ICD-10-CM | POA: Diagnosis not present

## 2024-01-17 DIAGNOSIS — I10 Essential (primary) hypertension: Secondary | ICD-10-CM | POA: Diagnosis not present

## 2024-01-17 DIAGNOSIS — S72001A Fracture of unspecified part of neck of right femur, initial encounter for closed fracture: Secondary | ICD-10-CM | POA: Diagnosis not present

## 2024-01-17 DIAGNOSIS — G301 Alzheimer's disease with late onset: Secondary | ICD-10-CM | POA: Diagnosis not present

## 2024-01-18 ENCOUNTER — Telehealth: Payer: Self-pay

## 2024-01-18 DIAGNOSIS — Z556 Problems related to health literacy: Secondary | ICD-10-CM | POA: Diagnosis not present

## 2024-01-18 DIAGNOSIS — Z9181 History of falling: Secondary | ICD-10-CM | POA: Diagnosis not present

## 2024-01-18 DIAGNOSIS — Z87891 Personal history of nicotine dependence: Secondary | ICD-10-CM | POA: Diagnosis not present

## 2024-01-18 DIAGNOSIS — Z7982 Long term (current) use of aspirin: Secondary | ICD-10-CM | POA: Diagnosis not present

## 2024-01-18 DIAGNOSIS — E871 Hypo-osmolality and hyponatremia: Secondary | ICD-10-CM | POA: Diagnosis not present

## 2024-01-18 DIAGNOSIS — I10 Essential (primary) hypertension: Secondary | ICD-10-CM | POA: Diagnosis not present

## 2024-01-18 DIAGNOSIS — N179 Acute kidney failure, unspecified: Secondary | ICD-10-CM | POA: Diagnosis not present

## 2024-01-18 DIAGNOSIS — D62 Acute posthemorrhagic anemia: Secondary | ICD-10-CM | POA: Diagnosis not present

## 2024-01-18 DIAGNOSIS — K5790 Diverticulosis of intestine, part unspecified, without perforation or abscess without bleeding: Secondary | ICD-10-CM | POA: Diagnosis not present

## 2024-01-18 DIAGNOSIS — F02B18 Dementia in other diseases classified elsewhere, moderate, with other behavioral disturbance: Secondary | ICD-10-CM | POA: Diagnosis not present

## 2024-01-18 DIAGNOSIS — S72051D Unspecified fracture of head of right femur, subsequent encounter for closed fracture with routine healing: Secondary | ICD-10-CM | POA: Diagnosis not present

## 2024-01-18 DIAGNOSIS — E785 Hyperlipidemia, unspecified: Secondary | ICD-10-CM | POA: Diagnosis not present

## 2024-01-18 DIAGNOSIS — Z96641 Presence of right artificial hip joint: Secondary | ICD-10-CM | POA: Diagnosis not present

## 2024-01-18 DIAGNOSIS — E559 Vitamin D deficiency, unspecified: Secondary | ICD-10-CM | POA: Diagnosis not present

## 2024-01-18 DIAGNOSIS — F5104 Psychophysiologic insomnia: Secondary | ICD-10-CM | POA: Diagnosis not present

## 2024-01-18 DIAGNOSIS — G300 Alzheimer's disease with early onset: Secondary | ICD-10-CM | POA: Diagnosis not present

## 2024-01-18 NOTE — Transitions of Care (Post Inpatient/ED Visit) (Signed)
 01/18/2024  Name: Eric Floyd MRN: [id number] DOB: Feb 25, 1946  Today's TOC FU Call Status: Today's TOC FU Call Status:: Successful TOC FU Call Completed TOC FU Call Complete Date: 01/18/24  Patient's Name and Date of Birth confirmed. Name, DOB  Transition Care Management Follow-up Telephone Call Date of Discharge: 01/17/24 Discharge Facility: Other [facility]) Name of Other (Non[facility]) Discharge Facility: [facility] Type of Discharge: Inpatient Admission Primary Inpatient Discharge Diagnosis:: fracture right femur How have you been since you were released from the hospital?: Better Any questions or concerns?: No  Items Reviewed: Did you receive and understand the discharge instructions provided?: Yes Medications obtained,verified, and reconciled?: Yes (Medications Reviewed) Any new allergies since your discharge?: No Dietary orders reviewed?: Yes Do you have support at home?: Yes People in Home [RPT]: spouse, child(ren), adult  Medications Reviewed Today: Medications Reviewed Today     Reviewed by Emmitt Pan, LPN (Licensed Practical Nurse) on 01/18/24 at [phone]  Med List Status: <None>   Medication Order Taking? Sig Documenting Provider Last Dose Status Informant  acetaminophen (TYLENOL) 500 MG tablet [id number] Yes Take 2 tablets (1,000 mg total) by mouth every 8 (eight) hours. Cheryle Page, MD  Active   Ascorbic Acid (VITAMIN C PO) [id number] Yes Take 1 tablet by mouth daily. [provider]  Active Child, Spouse/Significant Other, Pharmacy Records  aspirin 81 MG chewable tablet [id number] Yes Chew 1 tablet (81 mg total) by mouth 2 (two) times daily. Teresa Rankin ORN, MD  Active   CALCIUM  PO [id number] Yes Take 1 tablet by mouth daily. [provider]  Active Child, Spouse/Significant Other, Pharmacy Records  Cholecalciferol (VITAMIN D -3 PO) [id number] Yes Take 1 capsule by mouth daily. [provider]  Active Child, Spouse/Significant  Other, Pharmacy Records  Cyanocobalamin (VITAMIN B-12 PO) [id number] Yes Take 1 tablet by mouth daily. [provider]  Active Child, Spouse/Significant Other, Pharmacy Records  hydrOXYzine (ATARAX) 25 MG tablet [id number] Yes Take 1 tablet (25 mg total) by mouth at bedtime as needed. Purcell Emil Schanz, MD  Active Child, Spouse/Significant Other, Pharmacy Records  ipratropium (ATROVENT ) 0.03 % nasal spray [id number] Yes Place 1-2 sprays into both nostrils 2 (two) times daily as needed for rhinitis (allergies). Cheryle Page, MD  Active   lisinopril  (ZESTRIL ) 10 MG tablet [id number] Yes Take 1 tablet by mouth once daily Sagardia, Emil Schanz, MD  Active   memantine  (NAMENDA ) 5 MG tablet [id number] Yes Take 1 tablet (5 mg total) by mouth 2 (two) times daily. Gayland Lauraine PARAS, NP  Active Child, Spouse/Significant Other, Pharmacy Records  oxyCODONE (ROXICODONE) 5 MG immediate release tablet [id number] Yes Take 1 tablet (5 mg total) by mouth every 4 (four) hours as needed for severe pain (pain score 7-10). Teresa Rankin ORN, MD  Active   polyethylene glycol (MIRALAX / GLYCOLAX) 17 g packet [id number] Yes Take 17 g by mouth daily as needed. Cheryle Page, MD  Active   senna (SENOKOT) 8.6 MG TABS tablet [id number] Yes Take 2 tablets (17.2 mg total) by mouth at bedtime. Cheryle Page, MD  Active             Home Care and Equipment/Supplies: Were Home Health Services Ordered?: Yes Name of Home Health Agency:: [facility] Has Agency set up a time to come to your home?: Yes First Home Health Visit Date: 01/18/24 Any new equipment or medical supplies ordered?: Yes Name of Medical supply agency?: unknown Were you able to get the equipment/medical supplies?: Yes Do you have any questions  related to the use of the equipment/supplies?: No  Functional Questionnaire: Do you need assistance with bathing/showering or dressing?: No Do you need assistance with meal preparation?: No Do you need assistance  with eating?: No Do you have difficulty maintaining continence: No Do you need assistance with getting out of bed/getting out of a chair/moving?: No Do you have difficulty managing or taking your medications?: No  Follow up appointments reviewed: PCP Follow-up appointment confirmed?: Yes Date of PCP follow-up appointment?: 01/19/24 Follow-up Provider: [facility] Follow-up appointment confirmed?: No Reason Specialist Follow-Up Not Confirmed: Patient has Specialist Provider Number and will Call for Appointment Do you need transportation to your follow-up appointment?: No Do you understand care options if your condition(s) worsen?: Yes-patient verbalized understanding    SIGNATURE Julian Lemmings, LPN [facility] Nurse Health Advisor Direct Dial [phone]

## 2024-01-19 ENCOUNTER — Ambulatory Visit (INDEPENDENT_AMBULATORY_CARE_PROVIDER_SITE_OTHER): Admitting: Emergency Medicine

## 2024-01-19 ENCOUNTER — Encounter: Payer: Self-pay | Admitting: Emergency Medicine

## 2024-01-19 VITALS — BP 138/82 | HR 61 | Temp 98.1°F | Ht 64.0 in | Wt 137.0 lb

## 2024-01-19 DIAGNOSIS — I10 Essential (primary) hypertension: Secondary | ICD-10-CM

## 2024-01-19 DIAGNOSIS — S72001A Fracture of unspecified part of neck of right femur, initial encounter for closed fracture: Secondary | ICD-10-CM | POA: Diagnosis not present

## 2024-01-19 DIAGNOSIS — E785 Hyperlipidemia, unspecified: Secondary | ICD-10-CM | POA: Diagnosis not present

## 2024-01-19 DIAGNOSIS — F0280 Dementia in other diseases classified elsewhere without behavioral disturbance: Secondary | ICD-10-CM

## 2024-01-19 DIAGNOSIS — G309 Alzheimer's disease, unspecified: Secondary | ICD-10-CM | POA: Diagnosis not present

## 2024-01-19 DIAGNOSIS — Z09 Encounter for follow-up examination after completed treatment for conditions other than malignant neoplasm: Secondary | ICD-10-CM

## 2024-01-19 NOTE — Assessment & Plan Note (Signed)
Chronic stable condition Continues rosuvastatin 10 mg daily Diet and nutrition discussed

## 2024-01-19 NOTE — Patient Instructions (Signed)
 Mantenimiento de la salud despus de los 65 aos de edad Health Maintenance After Age 77 Despus de los 65 aos de edad, corre un riesgo mayor de Film/video editor enfermedades e infecciones a largo plazo, como tambin de sufrir lesiones por cadas. Las cadas son la causa principal de las fracturas de huesos y lesiones en la cabeza de personas mayores de 65 aos de edad. Recibir cuidados preventivos de forma regular puede ayudarlo a mantenerse saludable y en buen [place]. Los cuidados preventivos incluyen realizarse anlisis de forma regular y Forensic psychologist en el estilo de vida segn las recomendaciones del mdico. Converse con el mdico sobre lo siguiente: Las pruebas de deteccin y los anlisis que debe International aid/development worker. Una prueba de deteccin es un estudio que se para Engineer, manufacturing la presencia de una enfermedad cuando no tiene sntomas. Un plan de dieta y ejercicios adecuado para usted. Qu debo saber sobre las pruebas de deteccin y los anlisis para prevenir cadas? Realizarse pruebas de deteccin y [employer] es la mejor manera de Engineer, manufacturing un problema de salud de forma temprana. El diagnstico y tratamiento tempranos le brindan la mejor oportunidad de Chief Operating Officer las afecciones mdicas que son comunes despus de los 65 aos de edad. Ciertas afecciones y elecciones de estilo de vida pueden hacer que sea ms propenso a sufrir Engineer, site. El mdico puede recomendarle lo siguiente: Controles regulares de la visin. Una visin deficiente y afecciones como las cataratas pueden hacer que sea ms propenso a sufrir Engineer, site. Si usa  lentes, asegrese de obtener una receta actualizada si su visin cambia. Revisin de medicamentos. Revise regularmente con el mdico todos los medicamentos que toma, incluidos los medicamentos de [place]. Consulte al [employer] efectos secundarios que pueden hacer que sea ms propenso a sufrir Engineer, site. Informe al mdico si alguno de los medicamentos que toma lo hace sentir mareado o  somnoliento. Controles de fuerza y equilibrio. El mdico puede recomendar ciertos estudios para controlar su fuerza y equilibrio al estar de pie, al caminar o al cambiar de posicin. Examen de los pies. El dolor y Materials engineer en los pies, como tambin no utilizar el calzado adecuado, pueden hacer que sea ms propenso a sufrir Engineer, site. Pruebas de deteccin, que incluyen las siguientes: Pruebas de deteccin para la osteoporosis. La osteoporosis es una afeccin que hace que los huesos se tornen ms dbiles y se quiebren con ms facilidad. Pruebas de deteccin para la presin arterial. Los cambios en la presin arterial y los medicamentos para Chief Operating Officer la presin arterial pueden hacerlo sentir mareado. Prueba de deteccin de la depresin. Es ms probable que sufra una cada si tiene miedo a caerse, se siente deprimido o se siente incapaz de Probation officer. Prueba de deteccin de consumo de alcohol. Beber demasiado alcohol puede afectar su equilibrio y puede hacer que sea ms propenso a sufrir Engineer, site. Siga estas indicaciones en su casa: Estilo de vida No beba alcohol si: Su mdico le indica no hacerlo. Si bebe alcohol: Limite la cantidad que bebe a lo siguiente: De 0 a 1 medida por da para las mujeres. De 0 a 2 medidas por da para los hombres. Sepa cunta cantidad de alcohol hay en las bebidas que toma. En los [address], una medida equivale a una botella de cerveza de 12 oz (355 ml), un vaso de vino de 5 oz (148 ml) o un vaso de una bebida alcohlica de alta graduacin de 1 oz (44 ml). No consuma ningn producto que  contenga nicotina o tabaco. Estos productos incluyen cigarrillos, tabaco para mascar y aparatos de vapeo, como los cigarrillos electrnicos. Si necesita ayuda para dejar de consumir estos productos, consulte al [employer]. Actividad  Siga un programa de ejercicio regular para mantenerse en forma. Esto lo ayudar a Radio producer equilibrio. Consulte al  mdico qu tipos de ejercicios son adecuados para usted. Si necesita un bastn o un andador, selo segn las recomendaciones del mdico. Utilice calzado con buen apoyo y suela antideslizante. Seguridad  [employer] los [employer] puedan causar tropiezos tales como alfombras, cables u obstculos. Instale equipos de seguridad, como barras para sostn en los baos y barandas de seguridad en las escaleras. Mantenga las habitaciones y los pasillos bien iluminados. Indicaciones generales Hable con el mdico sobre sus riesgos de sufrir una cada. Infrmele a su mdico si: Se cae. Asegrese de informarle a su mdico acerca de todas las cadas, incluso aquellas que parecen ser [employer]. Se siente mareado, cansado (tiene fatiga) o siente que pierde el equilibrio. Use los medicamentos de venta libre y los recetados solamente como se lo haya indicado el mdico. Estos incluyen suplementos. Siga una dieta sana y [place] un peso saludable. Una dieta saludable incluye productos lcteos descremados, carnes bajas en contenido de grasa ([place]), fibra de granos enteros, frijoles y [place] frutas y verduras. Mantngase al da con las vacunas. Realcese los estudios de rutina de la salud, dentales y de Wellsite geologist. Resumen Tener un estilo de vida saludable y recibir cuidados preventivos pueden ayudar a Research scientist (physical sciences) salud y el bienestar despus de los 65 aos de [place]. Realizarse pruebas de deteccin y anlisis es la mejor manera de Engineer, manufacturing un problema de salud de forma temprana y ayudarlo a Automotive engineer una cada. El diagnstico y tratamiento tempranos le brindan la mejor oportunidad de Chief Operating Officer las afecciones mdicas ms comunes en las personas mayores de 65 aos de edad. Las cadas son la causa principal de las fracturas de huesos y lesiones en la cabeza de personas mayores de 65 aos de edad. Tome precauciones para evitar una cada en su casa. Trabaje con el mdico para saber qu cambios que puede hacer para mejorar su salud y  [place], y para prevenir las cadas. Esta informacin no tiene Theme park manager el consejo del mdico. Asegrese de hacerle al mdico cualquier pregunta que tenga. Document Revised: 07/24/2020 Document Reviewed: 07/24/2020 Elsevier Patient Education  2024 [employer].

## 2024-01-19 NOTE — Progress Notes (Signed)
 Eric Floyd 78 y.o.   Chief Complaint  Patient presents with   Follow-up    HISTORY OF PRESENT ILLNESS: This is a 78 y.o. male here for follow-up of hospital discharge.  Admitted on 12/26/2023 with right femoral neck fracture sustained after fall. Has been recovering well.  Accompanied by wife today.  Has no complaints or any other medical concerns.  HPI   Prior to Admission medications   Medication Sig Start Date End Date Taking? Authorizing Provider  acetaminophen (TYLENOL) 500 MG tablet Take 2 tablets (1,000 mg total) by mouth every 8 (eight) hours. 12/29/23   Cheryle Page, MD  Ascorbic Acid (VITAMIN C PO) Take 1 tablet by mouth daily.    [provider]  aspirin 81 MG chewable tablet Chew 1 tablet (81 mg total) by mouth 2 (two) times daily. 12/29/23 02/23/24  Teresa Rankin ORN, MD  CALCIUM  PO Take 1 tablet by mouth daily.    [provider]  Cholecalciferol (VITAMIN D -3 PO) Take 1 capsule by mouth daily.    [provider]  Cyanocobalamin (VITAMIN B-12 PO) Take 1 tablet by mouth daily.    [provider]  hydrOXYzine (ATARAX) 25 MG tablet Take 1 tablet (25 mg total) by mouth at bedtime as needed. 11/29/23   Nahiem Dredge Jose, MD  ipratropium (ATROVENT ) 0.03 % nasal spray Place 1-2 sprays into both nostrils 2 (two) times daily as needed for rhinitis (allergies). 12/29/23   Cheryle Page, MD  lisinopril  (ZESTRIL ) 10 MG tablet Take 1 tablet by mouth once daily 01/03/24   Latera Mclin Jose, MD  memantine  (NAMENDA ) 5 MG tablet Take 1 tablet (5 mg total) by mouth 2 (two) times daily. 06/03/23   Gayland Lauraine PARAS, NP  oxyCODONE (ROXICODONE) 5 MG immediate release tablet Take 1 tablet (5 mg total) by mouth every 4 (four) hours as needed for severe pain (pain score 7-10). 12/29/23   Teresa Rankin ORN, MD  polyethylene glycol (MIRALAX / GLYCOLAX) 17 g packet Take 17 g by mouth daily as needed. 12/29/23   Cheryle Page, MD  senna (SENOKOT) 8.6 MG  TABS tablet Take 2 tablets (17.2 mg total) by mouth at bedtime. 12/29/23   Cheryle Page, MD    No Known Allergies  Patient Active Problem List   Diagnosis Date Noted   Right femoral fracture (HCC) 12/26/2023   History of right hip hemiarthroplasty 12/26/2023   Chronic insomnia 11/29/2023   Sinus bradycardia on ECG 08/29/2020   Alzheimer's disease (HCC) 06/19/2020   Essential hypertension 06/19/2020   Diverticulosis 06/19/2020   Memory difficulty 02/19/2020   Vitamin D  deficiency 12/21/2019   Dyslipidemia 12/21/2019    Past Medical History:  Diagnosis Date   Hyperlipidemia    Memory difficulty 02/19/2020    Past Surgical History:  Procedure Laterality Date   HEMIARTHROPLASTY (BIPOLAR), HIP, DIRECT ANTERIOR APPROACH, FOR FRACTURE Right 12/26/2023   Procedure: HEMIARTHROPLASTY (BIPOLAR), HIP, DIRECT ANTERIOR APPROACH, FOR FRACTURE;  Surgeon: Teresa Rankin ORN, MD;  Location: [facility];  Service: Orthopedics;  Laterality: Right;   NO PAST SURGERIES      Social History   Socioeconomic History   Marital status: Married    Spouse name: Not on file   Number of children: Not on file   Years of education: Not on file   Highest education level: Not on file  Occupational History   Not on file  Tobacco Use   Smoking status: Former    Current packs/day: 0.00    Types: Cigarettes  Quit date: 03/02/1984    Years since quitting: 39.9   Smokeless tobacco: Never  Vaping Use   Vaping status: Never Used  Substance and Sexual Activity   Alcohol use: Yes    Alcohol/week: 21.0 standard drinks of alcohol    Types: 21 Shots of liquor per week    Comment: 3 drinks a day per pt   Drug use: Never   Sexual activity: Not Currently  Other Topics Concern   Not on file  Social History Narrative   Lives with wife   Right handed   Drinks 1-2 cups caffeine daily   Social Drivers of Health   Financial Resource Strain: Low Risk  (08/18/2023)   Overall Financial Resource Strain (CARDIA)     Difficulty of Paying Living Expenses: Not hard at all  Food Insecurity: No Food Insecurity (12/26/2023)   Hunger Vital Sign    Worried About Running Out of Food in the Last Year: Never true    Ran Out of Food in the Last Year: Never true  Transportation Needs: No Transportation Needs (12/26/2023)   PRAPARE - Administrator, Civil Service (Medical): No    Lack of Transportation (Non-Medical): No  Physical Activity: Inactive (08/18/2023)   Exercise Vital Sign    Days of Exercise per Week: 0 days    Minutes of Exercise per Session: 0 min  Stress: No Stress Concern Present (08/18/2023)   [employer] of Occupational Health - Occupational Stress Questionnaire    Feeling of Stress: Not at all  Social Connections: Moderately Isolated (12/26/2023)   Social Connection and Isolation Panel    Frequency of Communication with Friends and Family: More than three times a week    Frequency of Social Gatherings with Friends and Family: Once a week    Attends Religious Services: Never    Database Administrator or Organizations: No    Attends Banker Meetings: Never    Marital Status: Married  Catering Manager Violence: Not At Risk (12/26/2023)   Humiliation, Afraid, Rape, and Kick questionnaire    Fear of Current or Ex-Partner: No    Emotionally Abused: No    Physically Abused: No    Sexually Abused: No    Family History  Problem Relation Age of Onset   Stroke Mother    Heart disease Brother    Heart attack Brother    Ovarian cancer Niece    Colon cancer Neg Hx    Colon polyps Neg Hx    Esophageal cancer Neg Hx    Rectal cancer Neg Hx    Stomach cancer Neg Hx      Review of Systems  Constitutional: Negative.  Negative for chills and fever.  HENT: Negative.  Negative for congestion and sore throat.   Respiratory: Negative.  Negative for cough and shortness of breath.   Cardiovascular: Negative.  Negative for chest pain and palpitations.  Gastrointestinal:   Negative for abdominal pain, diarrhea, nausea and vomiting.  Genitourinary: Negative.  Negative for dysuria and hematuria.  Skin: Negative.  Negative for rash.  Neurological: Negative.  Negative for dizziness and headaches.  All other systems reviewed and are negative.   Vitals:   01/19/24 1453  BP: 138/82  Pulse: 61  Temp: 98.1 F (36.7 C)  SpO2: 97%    Physical Exam Vitals reviewed.  Constitutional:      Appearance: Normal appearance.  HENT:     Head: Normocephalic.     Mouth/Throat:  Mouth: Mucous membranes are moist.     Pharynx: Oropharynx is clear.  Eyes:     Extraocular Movements: Extraocular movements intact.     Pupils: Pupils are equal, round, and reactive to light.  Cardiovascular:     Rate and Rhythm: Normal rate and regular rhythm.     Heart sounds: Murmur heard.  Pulmonary:     Effort: Pulmonary effort is normal.     Breath sounds: Normal breath sounds.  Abdominal:     Palpations: Abdomen is soft.     Tenderness: There is no abdominal tenderness.  Skin:    General: Skin is warm and dry.     Comments: Surgical site healing well without infection.  Neurological:     General: No focal deficit present.     Mental Status: He is alert and oriented to person, place, and time. Mental status is at baseline.  Psychiatric:        Mood and Affect: Mood normal.        Behavior: Behavior normal.      ASSESSMENT & PLAN: A total of 40 minutes was spent with the patient and counseling/coordination of care regarding preparing for this visit, review of most recent office visit notes, review of most recent hospital discharge summary, pain management, review of multiple chronic medical conditions and their management, review of all medications, review of most recent bloodwork results, review of health maintenance items, education on nutrition, prognosis, documentation, and need for follow up.   Problem List Items Addressed This Visit       Cardiovascular and  Mediastinum   Essential hypertension   BP Readings from Last 3 Encounters:  01/19/24 138/82  12/29/23 (!) 141/71  11/29/23 122/72  Well-controlled hypertension Continue lisinopril  10 mg daily         Nervous and Auditory   Alzheimer's disease (HCC)   Stable chronic condition. Continues Namenda  5 mg twice a day Was able to follow-up with neurologist on 06/03/2023 Office notes reviewed        Musculoskeletal and Integument   Right femoral fracture (HCC) - Primary   Healing well.  No complications. Pain very well-controlled. Only occasional mild pain.  Uses Tylenol and or Advil as needed. No concerns identified today.        Other   Dyslipidemia   Chronic stable condition Continues rosuvastatin  10 mg daily Diet and nutrition discussed      Other Visit Diagnoses       Hospital discharge follow-up          Patient Instructions  Mantenimiento de la salud despus de los 65 aos de edad Health Maintenance After Age 49 Despus de los 65 aos de edad, corre un riesgo mayor de film/video editor enfermedades e infecciones a largo plazo, como tambin de sufrir lesiones por cadas. Las cadas son la causa principal de las fracturas de huesos y lesiones en la cabeza de personas mayores de 65 aos de edad. Recibir cuidados preventivos de forma regular puede ayudarlo a mantenerse saludable y en buen [place]. Los cuidados preventivos incluyen realizarse anlisis de forma regular y forensic psychologist en el estilo de vida segn las recomendaciones del mdico. Converse con el mdico sobre lo siguiente: Las pruebas de deteccin y los anlisis que debe international aid/development worker. Una prueba de deteccin es un estudio que se para engineer, manufacturing la presencia de una enfermedad cuando no tiene sntomas. Un plan de dieta y ejercicios adecuado para usted. Qu debo saber sobre las pruebas de deteccin y los anlisis  para prevenir cadas? Realizarse pruebas de deteccin y [employer] es la mejor manera de engineer, manufacturing un problema  de salud de forma temprana. El diagnstico y tratamiento tempranos le brindan la mejor oportunidad de chief operating officer las afecciones mdicas que son comunes despus de los 65 aos de edad. Ciertas afecciones y elecciones de estilo de vida pueden hacer que sea ms propenso a sufrir engineer, site. El mdico puede recomendarle lo siguiente: Controles regulares de la visin. Una visin deficiente y afecciones como las cataratas pueden hacer que sea ms propenso a sufrir engineer, site. Si usa  lentes, asegrese de obtener una receta actualizada si su visin cambia. Revisin de medicamentos. Revise regularmente con el mdico todos los medicamentos que toma, incluidos los medicamentos de [place]. Consulte al enterprise products efectos secundarios que pueden hacer que sea ms propenso a sufrir engineer, site. Informe al mdico si alguno de los medicamentos que toma lo hace sentir mareado o somnoliento. Controles de fuerza y equilibrio. El mdico puede recomendar ciertos estudios para controlar su fuerza y equilibrio al estar de pie, al caminar o al cambiar de posicin. Examen de los pies. El dolor y materials engineer en los pies, como tambin no utilizar el calzado adecuado, pueden hacer que sea ms propenso a sufrir engineer, site. Pruebas de deteccin, que incluyen las siguientes: Pruebas de deteccin para la osteoporosis. La osteoporosis es una afeccin que hace que los huesos se tornen ms dbiles y se quiebren con ms facilidad. Pruebas de deteccin para la presin arterial. Los cambios en la presin arterial y los medicamentos para chief operating officer la presin arterial pueden hacerlo sentir mareado. Prueba de deteccin de la depresin. Es ms probable que sufra una cada si tiene miedo a caerse, se siente deprimido o se siente incapaz de probation officer. Prueba de deteccin de consumo de alcohol. Beber demasiado alcohol puede afectar su equilibrio y puede hacer que sea ms propenso a sufrir engineer, site. Siga estas  indicaciones en su casa: Estilo de vida No beba alcohol si: Su mdico le indica no hacerlo. Si bebe alcohol: Limite la cantidad que bebe a lo siguiente: De 0 a 1 medida por da para las mujeres. De 0 a 2 medidas por da para los hombres. Sepa cunta cantidad de alcohol hay en las bebidas que toma. En los [address], una medida equivale a una botella de cerveza de 12 oz (355 ml), un vaso de vino de 5 oz (148 ml) o un vaso de una bebida alcohlica de alta graduacin de 1 oz (44 ml). No consuma ningn producto que contenga nicotina o tabaco. Estos productos incluyen cigarrillos, tabaco para theatre manager y aparatos de vapeo, como los cigarrillos electrnicos. Si necesita ayuda para dejar de consumir estos productos, consulte al american express. Actividad  Siga un programa de ejercicio regular para mantenerse en forma. Esto lo ayudar a radio producer equilibrio. Consulte al mdico qu tipos de ejercicios son adecuados para usted. Si necesita un bastn o un andador, selo segn las recomendaciones del mdico. Utilice calzado con buen apoyo y suela antideslizante. Seguridad  Retire los autonation puedan causar tropiezos tales como alfombras, cables u obstculos. Instale equipos de seguridad, como barras para sostn en los baos y barandas de seguridad en las escaleras. Mantenga las habitaciones y los pasillos bien iluminados. Indicaciones generales Hable con el mdico sobre sus riesgos de sufrir una cada. Infrmele a su mdico si: Se cae. Asegrese de informarle a su mdico acerca de todas las cadas, incluso aquellas que parecen ser liberty global.  Se siente mareado, cansado (tiene fatiga) o siente que pierde el equilibrio. Use los medicamentos de venta libre y los recetados solamente como se lo haya indicado el mdico. Estos incluyen suplementos. Siga una dieta sana y [place] un peso saludable. Una dieta saludable incluye productos lcteos descremados, carnes bajas en contenido de grasa ([place]), fibra de granos  enteros, frijoles y [place] frutas y verduras. Mantngase al da con las vacunas. Realcese los estudios de rutina de la salud, dentales y de wellsite geologist. Resumen Tener un estilo de vida saludable y recibir cuidados preventivos pueden ayudar a research scientist (physical sciences) salud y el bienestar despus de los 65 aos de [place]. Realizarse pruebas de deteccin y anlisis es la mejor manera de engineer, manufacturing un problema de salud de forma temprana y ayudarlo a automotive engineer una cada. El diagnstico y tratamiento tempranos le brindan la mejor oportunidad de chief operating officer las afecciones mdicas ms comunes en las personas mayores de 65 aos de edad. Las cadas son la causa principal de las fracturas de huesos y lesiones en la cabeza de personas mayores de 65 aos de edad. Tome precauciones para evitar una cada en su casa. Trabaje con el mdico para saber qu cambios que puede hacer para mejorar su salud y [place], y para prevenir las cadas. Esta informacin no tiene theme park manager el consejo del mdico. Asegrese de hacerle al mdico cualquier pregunta que tenga. Document Revised: 07/24/2020 Document Reviewed: 07/24/2020 Elsevier Patient Education  2024 Elsevier Inc.    Emil Schaumann, MD [facility]

## 2024-01-19 NOTE — Assessment & Plan Note (Signed)
 Healing well.  No complications. Pain very well-controlled. Only occasional mild pain.  Uses Tylenol and or Advil as needed. No concerns identified today.

## 2024-01-19 NOTE — Assessment & Plan Note (Signed)
 BP Readings from Last 3 Encounters:  01/19/24 138/82  12/29/23 (!) 141/71  11/29/23 122/72  Well-controlled hypertension Continue lisinopril  10 mg daily

## 2024-01-19 NOTE — Assessment & Plan Note (Signed)
 Stable chronic condition. Continues Namenda  5 mg twice a day Was able to follow-up with neurologist on 06/03/2023 Office notes reviewed

## 2024-01-20 DIAGNOSIS — R262 Difficulty in walking, not elsewhere classified: Secondary | ICD-10-CM | POA: Diagnosis not present

## 2024-01-20 DIAGNOSIS — M6281 Muscle weakness (generalized): Secondary | ICD-10-CM | POA: Diagnosis not present

## 2024-01-20 DIAGNOSIS — Z96641 Presence of right artificial hip joint: Secondary | ICD-10-CM | POA: Diagnosis not present

## 2024-01-20 DIAGNOSIS — M25651 Stiffness of right hip, not elsewhere classified: Secondary | ICD-10-CM | POA: Diagnosis not present

## 2024-01-25 DIAGNOSIS — Z96641 Presence of right artificial hip joint: Secondary | ICD-10-CM | POA: Diagnosis not present

## 2024-01-25 DIAGNOSIS — M6281 Muscle weakness (generalized): Secondary | ICD-10-CM | POA: Diagnosis not present

## 2024-01-25 DIAGNOSIS — R262 Difficulty in walking, not elsewhere classified: Secondary | ICD-10-CM | POA: Diagnosis not present

## 2024-01-25 DIAGNOSIS — M25651 Stiffness of right hip, not elsewhere classified: Secondary | ICD-10-CM | POA: Diagnosis not present

## 2024-01-26 DIAGNOSIS — F02B18 Dementia in other diseases classified elsewhere, moderate, with other behavioral disturbance: Secondary | ICD-10-CM | POA: Diagnosis not present

## 2024-01-26 DIAGNOSIS — E871 Hypo-osmolality and hyponatremia: Secondary | ICD-10-CM | POA: Diagnosis not present

## 2024-01-26 DIAGNOSIS — G300 Alzheimer's disease with early onset: Secondary | ICD-10-CM | POA: Diagnosis not present

## 2024-01-26 DIAGNOSIS — I10 Essential (primary) hypertension: Secondary | ICD-10-CM | POA: Diagnosis not present

## 2024-01-26 DIAGNOSIS — K5790 Diverticulosis of intestine, part unspecified, without perforation or abscess without bleeding: Secondary | ICD-10-CM | POA: Diagnosis not present

## 2024-01-26 DIAGNOSIS — E785 Hyperlipidemia, unspecified: Secondary | ICD-10-CM | POA: Diagnosis not present

## 2024-01-26 DIAGNOSIS — D62 Acute posthemorrhagic anemia: Secondary | ICD-10-CM | POA: Diagnosis not present

## 2024-01-26 DIAGNOSIS — N179 Acute kidney failure, unspecified: Secondary | ICD-10-CM | POA: Diagnosis not present

## 2024-01-26 DIAGNOSIS — Z7982 Long term (current) use of aspirin: Secondary | ICD-10-CM | POA: Diagnosis not present

## 2024-01-26 DIAGNOSIS — E559 Vitamin D deficiency, unspecified: Secondary | ICD-10-CM | POA: Diagnosis not present

## 2024-01-26 DIAGNOSIS — F5104 Psychophysiologic insomnia: Secondary | ICD-10-CM | POA: Diagnosis not present

## 2024-01-26 DIAGNOSIS — S72051D Unspecified fracture of head of right femur, subsequent encounter for closed fracture with routine healing: Secondary | ICD-10-CM | POA: Diagnosis not present

## 2024-01-31 DIAGNOSIS — M25651 Stiffness of right hip, not elsewhere classified: Secondary | ICD-10-CM | POA: Diagnosis not present

## 2024-01-31 DIAGNOSIS — Z96641 Presence of right artificial hip joint: Secondary | ICD-10-CM | POA: Diagnosis not present

## 2024-01-31 DIAGNOSIS — R262 Difficulty in walking, not elsewhere classified: Secondary | ICD-10-CM | POA: Diagnosis not present

## 2024-01-31 DIAGNOSIS — M6281 Muscle weakness (generalized): Secondary | ICD-10-CM | POA: Diagnosis not present

## 2024-02-02 DIAGNOSIS — R262 Difficulty in walking, not elsewhere classified: Secondary | ICD-10-CM | POA: Diagnosis not present

## 2024-02-02 DIAGNOSIS — M6281 Muscle weakness (generalized): Secondary | ICD-10-CM | POA: Diagnosis not present

## 2024-02-02 DIAGNOSIS — M25651 Stiffness of right hip, not elsewhere classified: Secondary | ICD-10-CM | POA: Diagnosis not present

## 2024-02-02 DIAGNOSIS — Z96641 Presence of right artificial hip joint: Secondary | ICD-10-CM | POA: Diagnosis not present

## 2024-02-03 ENCOUNTER — Telehealth: Payer: Self-pay | Admitting: Neurology

## 2024-02-03 NOTE — Telephone Encounter (Signed)
Patients wife called to confirm appointment

## 2024-02-07 DIAGNOSIS — M25651 Stiffness of right hip, not elsewhere classified: Secondary | ICD-10-CM | POA: Diagnosis not present

## 2024-02-07 DIAGNOSIS — R262 Difficulty in walking, not elsewhere classified: Secondary | ICD-10-CM | POA: Diagnosis not present

## 2024-02-07 DIAGNOSIS — Z96641 Presence of right artificial hip joint: Secondary | ICD-10-CM | POA: Diagnosis not present

## 2024-02-07 DIAGNOSIS — M6281 Muscle weakness (generalized): Secondary | ICD-10-CM | POA: Diagnosis not present

## 2024-02-08 ENCOUNTER — Encounter: Payer: Self-pay | Admitting: Neurology

## 2024-02-08 ENCOUNTER — Ambulatory Visit: Admitting: Neurology

## 2024-02-08 VITALS — BP 149/77 | HR 93 | Resp 14 | Wt 136.0 lb

## 2024-02-08 DIAGNOSIS — G309 Alzheimer's disease, unspecified: Secondary | ICD-10-CM | POA: Diagnosis not present

## 2024-02-08 DIAGNOSIS — F0280 Dementia in other diseases classified elsewhere without behavioral disturbance: Secondary | ICD-10-CM | POA: Diagnosis not present

## 2024-02-08 MED ORDER — MEMANTINE HCL 5 MG PO TABS: 5.0000 mg | ORAL_TABLET | Freq: Two times a day (BID) | ORAL | 3 refills | Status: AC

## 2024-02-08 NOTE — Progress Notes (Signed)
 PATIENT: Ameen Floyd DOB: [birth year]  REASON FOR VISIT: follow up for memory HISTORY FROM: patient, wife, interpreter  Primary Neurologist: Dr. Jenel, will now be followed by Dr. Buck  HISTORY OF PRESENT ILLNESS: Today 02/08/24 02/08/24 SS: Fall in October 2025 with right hip fracture from mechanical fall. Stayed in rehab a few weeks. Has a cane and walker, doing PT. MMSE 12/30. Memory is about the same. Went to [place] in Sept. Often forgets to use his cane. Some days goes to bed very early. Has Atarax  if needed when he goes to bed late. Gets up twice at night to use bathroom, otherwise sleeps well. Does ADLs independent, wife stands close by with reminders. Remains on Namenda  5 mg twice daily. Good mood.   06/03/23 SS: MMSE 13/30. Went to [place] in Feb, going again in September. Mentions during travel in [place], he got lost in the airport going to the bathroom. Confused with time travel, showering 1 AM. Memory declining, bring his wife the wrong things. He sleeps at night, wakes up 3 times to use bathroom, takes a shower during the night, he is up and down, turns on the lights. Has urinary accidents at night. Showers independent. Puts denture cream in his hair. Mood is okay, his wife is very patient with him. He is wiping his nose during most visit today.   03/10/22 SS: Here with his wife, for last 6 months giving him [employer] Brain Oil, thinks making him stable. He will go the wrong place in his house, no falls. We reduced the dose of Namenda  to 5 mg BID, he doesn't think dizzy, he hold walls less. Does his own ADLs. He doesn't want to exercise, has gained 8 lbs. Has a good appetite, sleeps good. Watches TV. Mood is good. Going to [place] next month. Feels like he is steadier.   Update 08/26/21 SS: Eric Floyd is here today for follow-up. On Namenda  10 mg BID. Doing well overall. Wife may note fidgety hands when sitting, If he stands up, may feel dizzy, seems to be  since increasing Namenda . Often will reach for something to hold onto. They went to [place] last month, he did well. Rare alcohol use, only 1 beer a month. Is sedentary, doesn't do his walking anymore. Does his own ADLs. No falls.    Update 02/25/2021 SS: Mr. Cornelio here today for follow-up for Alzheimer's Disease. Last visit HR was 40, Aricept  was stopped, was sent to cardiology. Determined no need for pacemaker, was not symptomatic. MMSE 14/30 today. Misplaces things, looks at old pictures, can't remember the names. He has calmed down greatly, due to cutting back on alcohol. Likes to watch TV, is rather sedentary. Has increased appetite. Enjoyed watching the world cup. Sleeping well, does get up 2-3 times to urinate. He doesn't drive.  Wife does medications, has been taking Namenda  10 mg 1/2 tablet twice daily. Does own ADLs. They went to [place] in August.  Update 08/19/2020 SS: Mr. Charters is a 78 year old male with history of memory disturbance.  MRI of the brain showed generalized atrophy consistent with Alzheimer's disease, mild white matter changes. MMSE 16/30. Has trouble remembering where to put things, forgets what his wife send him for in the house. Not driving anymore. Wife manages his finances. Spends his day reading, watching TV. Takes naps during day, sometimes trouble sleeping at night if naps during day. At times gets upset at his wife, frustrated when she corrects him. He  has 3 mixed drinks daily, more on the weekend. Has fallen out of bed twice. Lost 8 lbs since last seen. HR is noted to be 40 today, is asymptomatic.  HISTORY 02/19/2020 Dr. Jenel: Mr. Hogston is a 62 year old right-handed Hispanic male with a 1 year history of changes in memory.  He comes in with his wife today who indicates that she has noted some significant changes in memory for least a year or year and a half prior to this visit.  The wife noted that he was having some increasing problems with driving, he was starting  to get lost, she has not allowed him to drive over the last year.  She now has taken over the finances and is managing his medications over the last year, she also keeps up with the appointments.  The patient has short-term memory issues, he does have some problems with remembering names.  His older sister apparently also has memory problems.  The patient denies any headaches or dizziness, he denies numbness or weakness of the extremities or difficulty with balance or difficulty controlling the bowels or the bladder.  He is sleeping fairly well, but he has a tendency to nap during the day and stay awake and watch TV at night.  He is sent to this office for further evaluation.  He has had recent blood work done that shows a normal B12 level.  REVIEW OF SYSTEMS: Out of a complete 14 system review of symptoms, the patient complains only of the following symptoms, and all other reviewed systems are negative.   See HPI  ALLERGIES: No Known Allergies  HOME MEDICATIONS: Outpatient Medications Prior to Visit  Medication Sig Dispense Refill   acetaminophen  (TYLENOL ) 500 MG tablet Take 2 tablets (1,000 mg total) by mouth every 8 (eight) hours.     Ascorbic Acid (VITAMIN C PO) Take 1 tablet by mouth daily.     aspirin  81 MG chewable tablet Chew 1 tablet (81 mg total) by mouth 2 (two) times daily. 60 tablet 0   CALCIUM  PO Take 1 tablet by mouth daily.     Cholecalciferol (VITAMIN D -3 PO) Take 1 capsule by mouth daily.     Cyanocobalamin (VITAMIN B-12 PO) Take 1 tablet by mouth daily.     hydrOXYzine  (ATARAX ) 25 MG tablet Take 1 tablet (25 mg total) by mouth at bedtime as needed. 30 tablet 3   ipratropium (ATROVENT ) 0.03 % nasal spray Place 1-2 sprays into both nostrils 2 (two) times daily as needed for rhinitis (allergies).     lisinopril  (ZESTRIL ) 10 MG tablet Take 1 tablet by mouth once daily 90 tablet 3   memantine  (NAMENDA ) 5 MG tablet Take 1 tablet (5 mg total) by mouth 2 (two) times daily. 180 tablet  3   oxyCODONE  (ROXICODONE ) 5 MG immediate release tablet Take 1 tablet (5 mg total) by mouth every 4 (four) hours as needed for severe pain (pain score 7-10). 30 tablet 0   polyethylene glycol (MIRALAX  / GLYCOLAX ) 17 g packet Take 17 g by mouth daily as needed. 14 each 0   senna (SENOKOT) 8.6 MG TABS tablet Take 2 tablets (17.2 mg total) by mouth at bedtime. 30 tablet 0   No facility-administered medications prior to visit.    PAST MEDICAL HISTORY: Past Medical History:  Diagnosis Date   Hyperlipidemia    Memory difficulty 02/19/2020    PAST SURGICAL HISTORY: Past Surgical History:  Procedure Laterality Date   HEMIARTHROPLASTY (BIPOLAR), HIP, DIRECT ANTERIOR APPROACH, FOR FRACTURE  Right 12/26/2023   Procedure: HEMIARTHROPLASTY (BIPOLAR), HIP, DIRECT ANTERIOR APPROACH, FOR FRACTURE;  Surgeon: Teresa Rankin ORN, MD;  Location: [facility];  Service: Orthopedics;  Laterality: Right;   NO PAST SURGERIES      FAMILY HISTORY: Family History  Problem Relation Age of Onset   Stroke Mother    Heart disease Brother    Heart attack Brother    Ovarian cancer Niece    Colon cancer Neg Hx    Colon polyps Neg Hx    Esophageal cancer Neg Hx    Rectal cancer Neg Hx    Stomach cancer Neg Hx     SOCIAL HISTORY: Social History   Socioeconomic History   Marital status: Married    Spouse name: Not on file   Number of children: Not on file   Years of education: Not on file   Highest education level: Not on file  Occupational History   Not on file  Tobacco Use   Smoking status: Former    Current packs/day: 0.00    Types: Cigarettes    Quit date: 03/02/1984    Years since quitting: 39.9   Smokeless tobacco: Never  Vaping Use   Vaping status: Never Used  Substance and Sexual Activity   Alcohol use: Yes    Alcohol/week: 21.0 standard drinks of alcohol    Types: 21 Shots of liquor per week    Comment: 3 drinks a day per pt   Drug use: Never   Sexual activity: Not Currently  Other Topics  Concern   Not on file  Social History Narrative   Lives with wife   Right handed   Drinks 1-2 cups caffeine daily   Social Drivers of Health   Financial Resource Strain: Low Risk  (08/18/2023)   Overall Financial Resource Strain (CARDIA)    Difficulty of Paying Living Expenses: Not hard at all  Food Insecurity: No Food Insecurity (12/26/2023)   Hunger Vital Sign    Worried About Running Out of Food in the Last Year: Never true    Ran Out of Food in the Last Year: Never true  Transportation Needs: No Transportation Needs (12/26/2023)   PRAPARE - Administrator, Civil Service (Medical): No    Lack of Transportation (Non-Medical): No  Physical Activity: Inactive (08/18/2023)   Exercise Vital Sign    Days of Exercise per Week: 0 days    Minutes of Exercise per Session: 0 min  Stress: No Stress Concern Present (08/18/2023)   [employer] of Occupational Health - Occupational Stress Questionnaire    Feeling of Stress: Not at all  Social Connections: Moderately Isolated (12/26/2023)   Social Connection and Isolation Panel    Frequency of Communication with Friends and Family: More than three times a week    Frequency of Social Gatherings with Friends and Family: Once a week    Attends Religious Services: Never    Database Administrator or Organizations: No    Attends Banker Meetings: Never    Marital Status: Married  Catering Manager Violence: Not At Risk (12/26/2023)   Humiliation, Afraid, Rape, and Kick questionnaire    Fear of Current or Ex-Partner: No    Emotionally Abused: No    Physically Abused: No    Sexually Abused: No   PHYSICAL EXAM  Vitals:   02/08/24 1051 02/08/24 1056  BP: (!) 152/75 (!) 149/77  Pulse: 93   Resp: 14   Weight: 136 lb (61.7 kg)  Body mass index is 23.34 kg/m.  Generalized: Well developed, in no acute distress     02/08/2024   10:54 AM 06/03/2023   10:53 AM 08/01/2021    4:55 PM  MMSE - Mini Mental State Exam   Not completed:   Unable to complete  Orientation to time 3 0   Orientation to Place 0 1   Registration 1 3   Attention/ Calculation 0 1   Recall 1 0   Language- name 2 objects 2 2   Language- repeat 0 1   Language- follow 3 step command 3 3   Language- read & follow direction 1 1   Write a sentence 0 1   Copy design 1 0   Total score 12 13    Neurological examination  Mentation: Alert, cooperative, smiling, history is mostly provided by his wife. Follows all commands speech and language fluent.  Cranial nerve II-XII: Pupils were equal round reactive to light. Extraocular movements were full, visual field were full on confrontational test. Facial sensation and strength were normal. Head turning and shoulder shrug  were normal and symmetric. Motor: The motor testing reveals 5 over 5 strength of all 4 extremities. Good symmetric motor tone is noted throughout.  Sensory: Sensory testing is intact to soft touch on all 4 extremities. No evidence of extinction is noted.  Coordination: Cerebellar testing reveals good finger-nose-finger bilaterally  Gait and station: Gait is normal, independent, uses cane  DIAGNOSTIC DATA (LABS, IMAGING, TESTING) - I reviewed patient records, labs, notes, testing and imaging myself where available.  Lab Results  Component Value Date   WBC 6.7 12/29/2023   HGB 10.6 (L) 12/29/2023   HCT 32.5 (L) 12/29/2023   MCV 95.6 12/29/2023   PLT 208 12/29/2023      Component Value Date/Time   NA 134 (L) 12/29/2023 0334   NA 140 12/20/2019 1159   K 4.4 12/29/2023 0334   CL 99 12/29/2023 0334   CO2 28 12/29/2023 0334   GLUCOSE 96 12/29/2023 0334   BUN 29 (H) 12/29/2023 0334   BUN 15 12/20/2019 1159   CREATININE 0.83 12/29/2023 0334   CALCIUM  9.2 12/29/2023 0334   PROT 6.7 12/27/2023 0317   PROT 7.7 12/20/2019 1159   ALBUMIN  3.7 12/27/2023 0317   ALBUMIN  4.5 12/20/2019 1159   AST 55 (H) 12/27/2023 0317   ALT 20 12/27/2023 0317   ALKPHOS 53 12/27/2023 0317    BILITOT 0.4 12/27/2023 0317   BILITOT 0.5 12/20/2019 1159   GFRNONAA >60 12/29/2023 0334   GFRAA 94 12/20/2019 1159   Lab Results  Component Value Date   CHOL 165 05/04/2023   HDL 55.90 05/04/2023   LDLCALC 93 05/04/2023   TRIG 78.0 05/04/2023   CHOLHDL 3 05/04/2023   Lab Results  Component Value Date   HGBA1C 5.7 05/04/2023   Lab Results  Component Value Date   VITAMINB12 1,337 (H) 12/20/2019   Lab Results  Component Value Date   TSH 1.160 12/26/2023   ASSESSMENT AND PLAN 78 y.o. year old male  has a past medical history of Hyperlipidemia and Memory difficulty (02/19/2020). here with:  1.  Alzheimer's disease, 2.  Bradycardia  -MMSE 12/30, overall stable, unfortunately in October, he suffered a mechanical fall and had a hip fracture, completed rehab, back at home with his wife.  Was able to go to [place] twice this year. -Continue Namenda  5 mg twice daily, full dosing resulted gait unsteadiness  -Holding Aricept  due to bradycardia -Trazodone   resulted in dizziness, fall -Encouraged brain stimulating activity, physical exercise -Follow-up in 1 year or sooner if needed  Lauraine Gayland MANDES, DNP 02/08/2024, 11:31 AM [facility] [address], [address] [place], [place] [address] [phone]

## 2024-02-08 NOTE — Patient Instructions (Signed)
 Great to see you today! Continue Namenda  Stay active, exercise, be careful not to fall Good management of vascular risk factors Follow-up in 1 year.  Thanks!!

## 2024-02-09 DIAGNOSIS — M25651 Stiffness of right hip, not elsewhere classified: Secondary | ICD-10-CM | POA: Diagnosis not present

## 2024-02-09 DIAGNOSIS — R262 Difficulty in walking, not elsewhere classified: Secondary | ICD-10-CM | POA: Diagnosis not present

## 2024-02-09 DIAGNOSIS — M6281 Muscle weakness (generalized): Secondary | ICD-10-CM | POA: Diagnosis not present

## 2024-02-09 DIAGNOSIS — Z96641 Presence of right artificial hip joint: Secondary | ICD-10-CM | POA: Diagnosis not present

## 2024-03-14 ENCOUNTER — Other Ambulatory Visit: Payer: Self-pay | Admitting: Emergency Medicine

## 2024-03-14 DIAGNOSIS — E785 Hyperlipidemia, unspecified: Secondary | ICD-10-CM

## 2024-05-30 ENCOUNTER — Ambulatory Visit: Admitting: Emergency Medicine

## 2024-08-24 ENCOUNTER — Ambulatory Visit

## 2025-02-13 ENCOUNTER — Ambulatory Visit: Admitting: Neurology
